# Patient Record
Sex: Female | Born: 1990 | Race: White | Hispanic: No | Smoking: Current every day smoker
Health system: Southern US, Community
[De-identification: ages and names within clinical notes are randomized; demographics above are authoritative.]

## PROBLEM LIST (undated history)

## (undated) DIAGNOSIS — F32A Depression, unspecified: Secondary | ICD-10-CM

## (undated) DIAGNOSIS — F419 Anxiety disorder, unspecified: Secondary | ICD-10-CM

## (undated) DIAGNOSIS — I639 Cerebral infarction, unspecified: Secondary | ICD-10-CM

## (undated) DIAGNOSIS — I671 Cerebral aneurysm, nonruptured: Secondary | ICD-10-CM

## (undated) DIAGNOSIS — I729 Aneurysm of unspecified site: Secondary | ICD-10-CM

## (undated) DIAGNOSIS — I67841 Reversible cerebrovascular vasoconstriction syndrome: Secondary | ICD-10-CM

## (undated) HISTORY — PX: MULTIPLE TOOTH EXTRACTIONS: SHX2053

## (undated) HISTORY — DX: Depression, unspecified: F32.A

## (undated) HISTORY — DX: Cerebral aneurysm, nonruptured: I67.1

---
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
## 2011-12-08 DIAGNOSIS — I639 Cerebral infarction, unspecified: Secondary | ICD-10-CM

## 2011-12-08 DIAGNOSIS — I67841 Reversible cerebrovascular vasoconstriction syndrome: Secondary | ICD-10-CM

## 2011-12-08 HISTORY — DX: Reversible cerebrovascular vasoconstriction syndrome: I67.841

## 2011-12-08 HISTORY — DX: Cerebral infarction, unspecified: I63.9

## 2012-03-03 DIAGNOSIS — I609 Nontraumatic subarachnoid hemorrhage, unspecified: Secondary | ICD-10-CM | POA: Insufficient documentation

## 2012-03-10 DIAGNOSIS — D649 Anemia, unspecified: Secondary | ICD-10-CM | POA: Insufficient documentation

## 2012-03-10 DIAGNOSIS — I67848 Other cerebrovascular vasospasm and vasoconstriction: Secondary | ICD-10-CM | POA: Insufficient documentation

## 2012-03-10 DIAGNOSIS — D72829 Elevated white blood cell count, unspecified: Secondary | ICD-10-CM | POA: Insufficient documentation

## 2013-03-28 DIAGNOSIS — R413 Other amnesia: Secondary | ICD-10-CM | POA: Insufficient documentation

## 2016-07-23 DIAGNOSIS — F410 Panic disorder [episodic paroxysmal anxiety] without agoraphobia: Secondary | ICD-10-CM | POA: Insufficient documentation

## 2016-07-23 DIAGNOSIS — F411 Generalized anxiety disorder: Secondary | ICD-10-CM | POA: Insufficient documentation

## 2016-11-06 ENCOUNTER — Telehealth: Payer: Self-pay

## 2016-11-06 ENCOUNTER — Ambulatory Visit: Payer: Medicaid Other | Admitting: Neurology

## 2016-11-06 NOTE — Telephone Encounter (Signed)
Appt cancelled due to patient having pneumonia.

## 2016-11-12 ENCOUNTER — Encounter: Payer: Self-pay | Admitting: Neurology

## 2016-12-03 ENCOUNTER — Telehealth: Payer: Self-pay | Admitting: *Deleted

## 2016-12-03 NOTE — Telephone Encounter (Signed)
Called patient to see if she'd move her appt tomorrow to a different time. She stated her mother had just called and rescheduled her for 12/29/16. She then stated she prefers a female dr.  Informed her this RN will route her request to new pt coordinator to call patient today at her request.

## 2016-12-04 ENCOUNTER — Ambulatory Visit: Payer: Medicaid Other | Admitting: Neurology

## 2016-12-29 ENCOUNTER — Ambulatory Visit: Payer: Medicaid Other | Admitting: Neurology

## 2017-01-04 ENCOUNTER — Ambulatory Visit: Payer: Medicaid Other | Admitting: Neurology

## 2017-01-04 ENCOUNTER — Telehealth: Payer: Self-pay | Admitting: *Deleted

## 2017-01-04 NOTE — Telephone Encounter (Signed)
no showed new pt appt 

## 2017-01-05 ENCOUNTER — Encounter: Payer: Self-pay | Admitting: Neurology

## 2017-06-22 DIAGNOSIS — G40909 Epilepsy, unspecified, not intractable, without status epilepticus: Secondary | ICD-10-CM | POA: Insufficient documentation

## 2018-01-04 ENCOUNTER — Encounter: Payer: Self-pay | Admitting: *Deleted

## 2018-01-25 ENCOUNTER — Encounter: Payer: Medicaid Other | Admitting: Obstetrics & Gynecology

## 2018-07-19 ENCOUNTER — Encounter (HOSPITAL_COMMUNITY): Payer: Self-pay

## 2018-07-19 ENCOUNTER — Emergency Department (HOSPITAL_COMMUNITY)
Admission: EM | Admit: 2018-07-19 | Discharge: 2018-07-19 | Disposition: A | Discharge status: Home / Self Care | Payer: Medicaid Other | Attending: Emergency Medicine | Admitting: Emergency Medicine

## 2018-07-19 ENCOUNTER — Other Ambulatory Visit: Payer: Self-pay

## 2018-07-19 ENCOUNTER — Emergency Department (HOSPITAL_COMMUNITY): Payer: Medicaid Other

## 2018-07-19 DIAGNOSIS — F17200 Nicotine dependence, unspecified, uncomplicated: Secondary | ICD-10-CM | POA: Insufficient documentation

## 2018-07-19 DIAGNOSIS — F410 Panic disorder [episodic paroxysmal anxiety] without agoraphobia: Secondary | ICD-10-CM | POA: Insufficient documentation

## 2018-07-19 DIAGNOSIS — R0789 Other chest pain: Secondary | ICD-10-CM | POA: Insufficient documentation

## 2018-07-19 DIAGNOSIS — F419 Anxiety disorder, unspecified: Secondary | ICD-10-CM | POA: Diagnosis not present

## 2018-07-19 HISTORY — DX: Aneurysm of unspecified site: I72.9

## 2018-07-19 HISTORY — DX: Anxiety disorder, unspecified: F41.9

## 2018-07-19 HISTORY — DX: Cerebral infarction, unspecified: I63.9

## 2018-07-19 LAB — I-STAT TROPONIN, ED: Troponin i, poc: 0 ng/mL (ref 0.00–0.08)

## 2018-07-19 LAB — CBC
HEMATOCRIT: 42.4 % (ref 36.0–46.0)
Hemoglobin: 14.8 g/dL (ref 12.0–15.0)
MCH: 30.9 pg (ref 26.0–34.0)
MCHC: 34.9 g/dL (ref 30.0–36.0)
MCV: 88.5 fL (ref 78.0–100.0)
Platelets: 315 10*3/uL (ref 150–400)
RBC: 4.79 MIL/uL (ref 3.87–5.11)
RDW: 12.6 % (ref 11.5–15.5)
WBC: 7.8 10*3/uL (ref 4.0–10.5)

## 2018-07-19 LAB — BASIC METABOLIC PANEL
Anion gap: 13 (ref 5–15)
BUN: 13 mg/dL (ref 6–20)
CO2: 23 mmol/L (ref 22–32)
Calcium: 9.8 mg/dL (ref 8.9–10.3)
Chloride: 103 mmol/L (ref 98–111)
Creatinine, Ser: 0.65 mg/dL (ref 0.44–1.00)
GFR calc Af Amer: >60 mL/min (ref >60)
GLUCOSE: 102 mg/dL — AB (ref 70–99)
POTASSIUM: 3.6 mmol/L (ref 3.5–5.1)
Sodium: 139 mmol/L (ref 135–145)

## 2018-07-19 LAB — I-STAT BETA HCG BLOOD, ED (MC, WL, AP ONLY)

## 2018-07-19 MED ORDER — DIAZEPAM 5 MG PO TABS: 5.0000 mg | ORAL_TABLET | Freq: Four times a day (QID) | ORAL | 0 refills | Status: DC | PRN

## 2018-07-19 MED ORDER — LORAZEPAM 2 MG/ML IJ SOLN
1.0000 mg | Freq: Once | INTRAMUSCULAR | Status: AC
  Administered 2018-07-19: 1 mg via INTRAVENOUS
  Filled 2018-07-19: qty 1

## 2018-07-19 MED ORDER — SODIUM CHLORIDE 0.9 % IV BOLUS
1000.0000 mL | Freq: Once | INTRAVENOUS | Status: AC
  Administered 2018-07-19: 1000 mL via INTRAVENOUS

## 2018-07-19 NOTE — ED Triage Notes (Addendum)
Pt states she feels like shes been having anxiety x 2 weeks. Pt states it feels like her heart is racing but it's not. Denies SI HI. Takes xanax, last taken yesterday around 5:30pm. Pt is requesting to be seen by a doctor because she wants to see what wrong with her, anxiety normally doesn't last this long. Reports pain "right under left boob."

## 2018-07-19 NOTE — Discharge Instructions (Signed)
Take valium as needed for anxiety.   See your doctor  Return to ER if you have worse chest pain, anxiety, shortness of breath.

## 2018-07-19 NOTE — ED Provider Notes (Signed)
[facility]-EMERGENCY DEPT Provider Note   CSN: [id number] Arrival date & time: 07/19/18  1151     History   Chief Complaint Chief Complaint  Patient presents with  . Chest Pain  . Anxiety    HPI Dawn Muscatshley Rashad is a [age] y.o. female hx of anxiety, SAH, chronic pain on Suboxone here presenting with anxiety, chest pain.  Patient states that she has been worried about her dental procedure that is scheduled today.  She is supposed to get multiple teeth extracted today.  However she was unable to sleep last several nights due to anxiety.  She states that she has been having some panic attacks and occasional palpitation for the last 2 weeks.  Has some chest pressure associated with that as well.  Patient denies any recent travel history of blood clots.  Patient took a dose of Xanax yesterday with improvement of symptoms.  She states she had been on Valium previously and that really helps with her anxiety symptoms.  History of blood clots or CAD.  The history is provided by the patient.    Past Medical History:  Diagnosis Date  . Aneurysm (HCC) 2013  . Anxiety   . Stroke [facility](HCC) 2013    There are no active problems to display for this patient.   History reviewed. No pertinent surgical history.   OB History   None      Home Medications    Prior to Admission medications   Not on File    Family History History reviewed. No pertinent family history.  Social History Social History   Tobacco Use  . Smoking status: Current Every Day Smoker  Substance Use Topics  . Alcohol use: Not Currently  . Drug use: Not Currently     Allergies   Hydrocodone-acetaminophen; Morphine; Naloxone; and Varenicline   Review of Systems Review of Systems  Cardiovascular: Positive for chest pain.  All other systems reviewed and are negative.    Physical Exam Updated Vital Signs BP 116/81   Pulse 67   Temp 98.1 F (36.7 C) (Oral)   Resp 16   Ht 5\' 8"  (1.727 m)    Wt 77.1 kg   LMP 06/17/2018   SpO2 100%   BMI 25.85 kg/m   Physical Exam  Constitutional: She is oriented to person, place, and time.  Anxious   HENT:  Head: Normocephalic.  Eyes: Pupils are equal, round, and reactive to light. EOM are normal.  Neck: Normal range of motion. Neck supple.  Cardiovascular: Normal rate, regular rhythm, intact distal pulses and normal pulses.  Pulmonary/Chest: Effort normal and breath sounds normal.  Abdominal: Soft. Bowel sounds are normal.  Musculoskeletal: Normal range of motion.       Right lower leg: Normal.       Left lower leg: Normal.  Neurological: She is alert and oriented to person, place, and time.  Skin: Skin is warm. Capillary refill takes less than 2 seconds.  Psychiatric:  Anxious, not suicidal. Normal judgment   Nursing note and vitals reviewed.    ED Treatments / Results  Labs (all labs ordered are listed, but only abnormal results are displayed) Labs Reviewed  BASIC METABOLIC PANEL - Abnormal; Notable for the following components:      Result Value   Glucose, Bld 102 (*)    All other components within normal limits  CBC  I-STAT TROPONIN, ED  I-STAT BETA HCG BLOOD, ED ([facility], [facility], [facility] ONLY)    EKG EKG Interpretation  Date/Time:  Tuesday July 19 2018 12:22:36 EDT Ventricular Rate:  85 PR Interval:    QRS Duration: 68 QT Interval:  426 QTC Calculation: 507 R Axis:   56 Text Interpretation:  Sinus rhythm Right atrial enlargement Borderline T wave abnormalities Prolonged QT interval No previous ECGs available Confirmed by Richardean CanalYao, Elijah Michaelis H [phone]([id number]) on 07/19/2018 1:41:19 PM   Radiology Dg Chest 2 View  Result Date: 07/19/2018 CLINICAL DATA:  Initial evaluation for acute chest pain, anxiety. EXAM: CHEST - 2 VIEW COMPARISON:  None. FINDINGS: Patient is slightly rotated to the right. Cardiac and mediastinal silhouettes within normal limits. Lungs normally inflated. Subtly increased asymmetric hazy opacity within the right lung  base as compared to the left felt to be related to patient rotation. No focal infiltrates. No pulmonary edema or pleural effusion. No pneumothorax. No acute osseous abnormality. IMPRESSION: No active cardiopulmonary disease. Electronically Signed   By: Rise MuBenjamin  McClintock M.D.   On: 07/19/2018 14:07    Procedures Procedures (including critical care time)  Medications Ordered in ED Medications  sodium chloride 0.9 % bolus 1,000 mL (1,000 mLs Intravenous New Bag/Given 07/19/18 1432)  LORazepam (ATIVAN) injection 1 mg (1 mg Intravenous Given 07/19/18 1433)     Initial Impression / Assessment and Plan / ED Course  I have reviewed the triage vital signs and the nursing notes.  Pertinent labs & imaging results that were available during my care of the patient were reviewed by me and considered in my medical decision making (see chart for details).     Dawn Davies is a 27 y.o. female here with anxiety. Likely panic attacks. She is concerned about her heart. She has low heart score and symptoms atypical for ACS and I doubt PE. Will get trop x 1 (symptoms for 2 weeks), CXR, labs. Will give ativan and reassess.   2:59 PM Labs unremarkable. CXR clear. Felt better after ativan. She request some valium prn for anxiety. Told her that she should take one before her dental procedure.    Final Clinical Impressions(s) / ED Diagnoses   Final diagnoses:  None    ED Discharge Orders    None       Charlynne PanderYao, Ioane Bhola Hsienta, MD 07/19/18 1501

## 2018-07-19 NOTE — ED Notes (Signed)
Pt had drawn for labs:  Gold Blue Lavender Lt green dk green x1

## 2018-07-27 ENCOUNTER — Encounter (HOSPITAL_COMMUNITY): Payer: Self-pay | Admitting: Emergency Medicine

## 2018-07-27 ENCOUNTER — Other Ambulatory Visit: Payer: Self-pay

## 2018-07-27 ENCOUNTER — Emergency Department (HOSPITAL_COMMUNITY): Payer: Medicaid Other

## 2018-07-27 ENCOUNTER — Emergency Department (HOSPITAL_COMMUNITY)
Admission: EM | Admit: 2018-07-27 | Discharge: 2018-07-27 | Disposition: A | Discharge status: Home / Self Care | Payer: Medicaid Other | Attending: Emergency Medicine | Admitting: Emergency Medicine

## 2018-07-27 DIAGNOSIS — F17200 Nicotine dependence, unspecified, uncomplicated: Secondary | ICD-10-CM | POA: Insufficient documentation

## 2018-07-27 DIAGNOSIS — G44209 Tension-type headache, unspecified, not intractable: Secondary | ICD-10-CM | POA: Insufficient documentation

## 2018-07-27 DIAGNOSIS — R51 Headache: Secondary | ICD-10-CM | POA: Diagnosis present

## 2018-07-27 LAB — CBC WITH DIFFERENTIAL/PLATELET
BASOS PCT: 0 %
Basophils Absolute: 0 10*3/uL (ref 0.0–0.1)
EOS PCT: 1 %
Eosinophils Absolute: 0 10*3/uL (ref 0.0–0.7)
HEMATOCRIT: 41.9 % (ref 36.0–46.0)
Hemoglobin: 14 g/dL (ref 12.0–15.0)
Lymphocytes Relative: 22 %
Lymphs Abs: 1.3 10*3/uL (ref 0.7–4.0)
MCH: 30.4 pg (ref 26.0–34.0)
MCHC: 33.4 g/dL (ref 30.0–36.0)
MCV: 90.9 fL (ref 78.0–100.0)
MONO ABS: 0.2 10*3/uL (ref 0.1–1.0)
Monocytes Relative: 4 %
NEUTROS ABS: 4.5 10*3/uL (ref 1.7–7.7)
Neutrophils Relative %: 73 %
PLATELETS: 263 10*3/uL (ref 150–400)
RBC: 4.61 MIL/uL (ref 3.87–5.11)
RDW: 13.6 % (ref 11.5–15.5)
WBC: 6.2 10*3/uL (ref 4.0–10.5)

## 2018-07-27 LAB — BASIC METABOLIC PANEL
Anion gap: 11 (ref 5–15)
BUN: 11 mg/dL (ref 6–20)
CALCIUM: 9.6 mg/dL (ref 8.9–10.3)
CO2: 24 mmol/L (ref 22–32)
Chloride: 108 mmol/L (ref 98–111)
Creatinine, Ser: 0.88 mg/dL (ref 0.44–1.00)
GFR calc Af Amer: >60 mL/min (ref >60)
GLUCOSE: 146 mg/dL — AB (ref 70–99)
Potassium: 3.6 mmol/L (ref 3.5–5.1)
SODIUM: 143 mmol/L (ref 135–145)

## 2018-07-27 LAB — I-STAT BETA HCG BLOOD, ED (MC, WL, AP ONLY): I-stat hCG, quantitative: <5 m[IU]/mL (ref <5)

## 2018-07-27 MED ORDER — IBUPROFEN 800 MG PO TABS: 800.0000 mg | ORAL_TABLET | Freq: Three times a day (TID) | ORAL | 0 refills | Status: DC | PRN

## 2018-07-27 MED ORDER — METOCLOPRAMIDE HCL 10 MG PO TABS: 10.0000 mg | ORAL_TABLET | Freq: Three times a day (TID) | ORAL | 0 refills | Status: DC | PRN

## 2018-07-27 MED ORDER — DIPHENHYDRAMINE HCL 50 MG/ML IJ SOLN
50.0000 mg | Freq: Once | INTRAMUSCULAR | Status: AC
  Administered 2018-07-27: 50 mg via INTRAVENOUS
  Filled 2018-07-27: qty 1

## 2018-07-27 MED ORDER — SODIUM CHLORIDE 0.9 % IV BOLUS
500.0000 mL | Freq: Once | INTRAVENOUS | Status: AC
Start: 2018-07-27 — End: 2018-07-27
  Administered 2018-07-27: 500 mL via INTRAVENOUS

## 2018-07-27 MED ORDER — KETOROLAC TROMETHAMINE 30 MG/ML IJ SOLN
30.0000 mg | Freq: Once | INTRAMUSCULAR | Status: AC
  Administered 2018-07-27: 30 mg via INTRAVENOUS
  Filled 2018-07-27: qty 1

## 2018-07-27 MED ORDER — METOCLOPRAMIDE HCL 5 MG/ML IJ SOLN
10.0000 mg | Freq: Once | INTRAMUSCULAR | Status: AC
  Administered 2018-07-27: 10 mg via INTRAVENOUS
  Filled 2018-07-27: qty 2

## 2018-07-27 MED ORDER — LORAZEPAM 2 MG/ML IJ SOLN
1.0000 mg | Freq: Once | INTRAMUSCULAR | Status: AC
  Administered 2018-07-27: 1 mg via INTRAVENOUS
  Filled 2018-07-27: qty 1

## 2018-07-27 NOTE — ED Triage Notes (Signed)
Per EMS, patient from home, c/o headache x3 days. Reports taking tylenol today with no relief. Patient also c/o anxiety, requesting xanax from EMS. Hx aneurysm.  BP 134/70

## 2018-07-27 NOTE — ED Notes (Signed)
Pt ambulated to the BR with steady gait.  Reports her headache is much better.  Family remains at bedside.

## 2018-07-27 NOTE — ED Provider Notes (Signed)
Emergency Department Provider Note   I have reviewed the triage vital signs and the nursing notes.   HISTORY  Chief Complaint Headache   HPI Dawn Davies is a 27 y.o. female with PMH of SAH and anxiety resents to the emergency department for evaluation of intermittent headache over the past 3 days with increasing anxiety symptoms.  The patient states that she has had a mild, on and off, right-sided headache over the past 3 days.  She felt like an hour before ED presentation her headache worsened.  Reports some photophobia but denies blurry or double vision.  No speech changes.  States it does not feel like her SAH type HA in the past. In 2013 she was treated for post-partum SAH. No aneurysm was found or coiled. She does have a history of migraine HA as well. Denies any weakness/numbness symptoms. No fever/chills. She went to her PCP today for medication refills for anxiety meds and is requesting something here in the ED.   Past Medical History:  Diagnosis Date  . Aneurysm (HCC) 2013  . Anxiety   . Stroke [facility](HCC) 2013    There are no active problems to display for this patient.   History reviewed. No pertinent surgical history.  Allergies Hydrocodone-acetaminophen; Morphine; Naloxone; and Varenicline  No family history on file.  Social History Social History   Tobacco Use  . Smoking status: Current Every Day Smoker  Substance Use Topics  . Alcohol use: Not Currently  . Drug use: Not Currently    Review of Systems  Constitutional: No fever/chills Eyes: No visual changes. Positive photophobia.  ENT: No sore throat. Cardiovascular: Denies chest pain. Respiratory: Denies shortness of breath. Gastrointestinal: No abdominal pain. Positive nausea, no vomiting.  No diarrhea.  No constipation. Genitourinary: Negative for dysuria. Musculoskeletal: Negative for back pain. Skin: Negative for rash. Neurological: Negative for focal weakness or numbness. Positive HA.    10-point ROS otherwise negative.  ____________________________________________   PHYSICAL EXAM:  VITAL SIGNS: ED Triage Vitals [07/27/18 1225]  Enc Vitals Group     BP 125/88     Pulse Rate (!) 125     Resp (!) 22     Temp 98.9 F (37.2 C)     Temp Source Oral     SpO2 100 %     Weight 155 lb (70.3 kg)     Height 5\' 8"  (1.727 m)   Constitutional: Alert and oriented. Well appearing and in no acute distress. Eyes: Conjunctivae are normal. PERRL. EOMI. Head: Atraumatic. Nose: No congestion/rhinnorhea. Mouth/Throat: Mucous membranes are moist.  Oropharynx non-erythematous. Neck: No stridor.  No meningeal signs.  Cardiovascular: Tachycardia. Good peripheral circulation. Grossly normal heart sounds.   Respiratory: Normal respiratory effort.  No retractions. Lungs CTAB. Gastrointestinal: Soft and nontender. No distention.  Musculoskeletal: No lower extremity tenderness nor edema. No gross deformities of extremities. Neurologic:  Normal speech and language. No gross focal neurologic deficits are appreciated. Normal CN exam 2-12. No pronator drift.  Skin:  Skin is warm, dry and intact. No rash noted.   ____________________________________________   LABS (all labs ordered are listed, but only abnormal results are displayed)  Labs Reviewed  BASIC METABOLIC PANEL - Abnormal; Notable for the following components:      Result Value   Glucose, Bld 146 (*)    All other components within normal limits  CBC WITH DIFFERENTIAL/PLATELET  I-STAT BETA HCG BLOOD, ED ([facility], [facility], [facility] ONLY)   ____________________________________________  RADIOLOGY  Ct Head Wo Contrast  Result Date: 07/27/2018 CLINICAL DATA:  Headache for 3 days. EXAM: CT HEAD WITHOUT CONTRAST TECHNIQUE: Contiguous axial images were obtained from the base of the skull through the vertex without intravenous contrast. COMPARISON:  Report of prior study January 12, 2016 available; images from that study cannot be retrieved.  FINDINGS: Brain: The ventricles are normal in size and configuration. There is no intracranial mass, hemorrhage, extra-axial fluid collection, or midline shift. Gray-white compartments are normal. No evident acute infarct. Vascular: No hyperdense vessels.  No abnormal calcification noted. Skull: Bony calvarium appears intact. Sinuses/Orbits: There is mucosal thickening in several ethmoid air cells. Visualized paranasal sinuses elsewhere clear. Orbits appear symmetric bilaterally. Other: Mastoid air cells are clear. IMPRESSION: Slight mucosal thickening in several ethmoid air cells. Study otherwise unremarkable. Electronically Signed   By: Bretta BangWilliam  Woodruff III M.D.   On: 07/27/2018 14:50    ____________________________________________   PROCEDURES  Procedure(s) performed:   Procedures  None ____________________________________________   INITIAL IMPRESSION / ASSESSMENT AND PLAN / ED COURSE  Pertinent labs & imaging results that were available during my care of the patient were reviewed by me and considered in my medical decision making (see chart for details).  Patient presents to the emergency department for evaluation of intermittent headache over the past 3 days which is worsened significantly in the last hour.  No neurological deficits.  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  Patient is well-appearing.  She does report some anxiety symptoms.  Headache does not seem consistent with a sentinel bleed from an aneurysm.  Plan for headache treatment, baseline labs, and CT imaging of the head.  We are within 1 hour of headache suddenly worsening.   02:55 PM CT head negative. Labs unremarkable. Patient is feeling complete relief of HA symptoms. [facility]. Discussed remaining slight risk of SAH and LP would be the next test if clinical concern was high. Will defer LP at this time after shared decision  making discussion with patient. Will provide NSG follow up information as the patient states that she is supposed to have regular Neurosurgery follow up since 2013.   At this time, I do not feel there is any life-threatening condition present. I have reviewed and discussed all results (EKG, imaging, lab, urine as appropriate), exam findings with patient. I have reviewed nursing notes and appropriate previous records.  I feel the patient is safe to be discharged home without further emergent workup. Discussed usual and customary return precautions. Patient and family (if present) verbalize understanding and are comfortable with this plan.  Patient will follow-up with their primary care provider. If they do not have a primary care provider, information for follow-up has been provided to them. All questions have been answered.   ____________________________________________  FINAL CLINICAL IMPRESSION(S) / ED DIAGNOSES  Final diagnoses:  Acute non intractable tension-type headache     MEDICATIONS GIVEN DURING THIS VISIT:  Medications  ketorolac (TORADOL) 30 MG/ML injection 30 mg (has no administration in time range)  sodium chloride 0.9 % bolus 500 mL (0 mLs Intravenous Stopped 07/27/18 1429)  metoCLOPramide (REGLAN) injection 10 mg (10 mg Intravenous Given 07/27/18 1323)  diphenhydrAMINE (BENADRYL) injection 50 mg (50 mg Intravenous Given 07/27/18 1321)  LORazepam (ATIVAN) injection 1 mg (1 mg Intravenous Given 07/27/18 1321)     NEW OUTPATIENT MEDICATIONS STARTED DURING THIS VISIT:  New Prescriptions   IBUPROFEN (ADVIL,MOTRIN) 800 MG TABLET    Take 1  tablet (800 mg total) by mouth every 8 (eight) hours as needed.   METOCLOPRAMIDE (REGLAN) 10 MG TABLET    Take 1 tablet (10 mg total) by mouth every 8 (eight) hours as needed for nausea or vomiting (or headache symptoms).    Note:  This document was prepared using Dragon voice recognition software and may include unintentional dictation  errors.  Alona Bene, MD Emergency Medicine    Long, Arlyss Repress, MD 07/27/18 [phone]

## 2018-07-27 NOTE — ED Notes (Signed)
Family at bedside. 

## 2018-07-27 NOTE — Discharge Instructions (Signed)

## 2018-09-21 DIAGNOSIS — F1320 Sedative, hypnotic or anxiolytic dependence, uncomplicated: Secondary | ICD-10-CM | POA: Insufficient documentation

## 2018-10-18 ENCOUNTER — Encounter: Payer: Self-pay | Admitting: Neurology

## 2019-01-05 ENCOUNTER — Other Ambulatory Visit: Payer: Self-pay

## 2019-01-05 ENCOUNTER — Ambulatory Visit: Payer: Medicaid Other | Admitting: Neurology

## 2019-01-05 ENCOUNTER — Encounter: Payer: Self-pay | Admitting: Neurology

## 2019-01-05 VITALS — BP 108/70 | HR 100 | Ht 68.0 in | Wt 172.0 lb

## 2019-01-05 DIAGNOSIS — R251 Tremor, unspecified: Secondary | ICD-10-CM | POA: Diagnosis not present

## 2019-01-05 NOTE — Patient Instructions (Addendum)
1. Schedule MRI brain with and without contrast  We have sent a referral to [facility] for your MRI and they will call you directly to schedule your appt. They are located at [address] [facility] If you need to contact them directly please call [phone].  2. Schedule 1-hour sleep-deprived EEG 3. Our office will call you with results, follow-up in 6 months, call for any changes

## 2019-01-05 NOTE — Progress Notes (Signed)
NEUROLOGY CONSULTATION NOTE  Dawn Davies MRN: [id number] DOB: [birth year]  Referring provider: Dr. Delano Metz Primary care provider: Dr. Delano Metz  Reason for consult:  seizures  Dear Dr Salvadore Farber:  Thank you for your kind referral of Dawn Davies for consultation of the above symptoms. Although her history is well known to you, please allow me to reiterate it for the purpose of our medical record. The patient was accompanied to the clinic by her significant other Dawn Davies who also provides collateral information. Records and images were personally reviewed where available.  HISTORY OF PRESENT ILLNESS: This is a 28 year old right-handed woman with a history of subarachnoid hemorrhage in 2013 presenting for evaluation of seizures. Records from [place] were reviewed. In March 2013, she was 3 weeks post-partum when she developed severe headaches. She went to [facility] where CT head showed [facility] and she was transferred to [facility]. 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 She was initially started on Verapamil then Nimodipine and had TCDs which showed increased velocities in the bilateral cerebral vasculature. She needed narcotics for headache pain control. On her follow-up visits she reported continued headaches and was started on Gabapentin and memory loss. Her last visit at their Neurology clinic was in 2014. There was a phone call to their office in April 2016 where she reported 4 seizures over the past year where she thinks she has anxiety attacks because she is aware of the events, recalling eye rolling, heavy breathing, both hands becoming numb, and chest tightness. She was told by one of her doctors it looked like a seizure, she was asking for Valium.   She reports today that the seizures started after  her SAH in 2013 and that she was having them initially every 3-4 weeks but quieted down, with last episode occurring over 6 months ago. She recalls her hands would get numb then she would lose consciousness/awareness. No tongue bite or incontinence. Dawn Davies reports she would alert him that it's coming on then blacks out with shaking lasting 20-30 seconds. Her eyes would be open and she would not respond. Sometimes she is confused after, but a lot of times she is not. She has fallen to the ground with them, no nocturnal seizures. She feels drained after with no focal weakness. She has anxiety attacks where she feels like she can't breath with tingling in both hands but would not pass out from them. She has anxiety when she gets a headaches or "scares myself into it" due to her prior history of SAH. These have calmed down with Xanax. Headaches occur mostly in the winter, she denies any headaches during summer time. She has some dizziness when standing up quickly. No diplopia. She was previously told the right side of her body is weaker when she was examined for her disability application. No falls. Dawn Davies denies any staring/unresponsive episodes separate from the shaking episodes, she denies any olfactory/gustatory hallucinations, deja vu, rising epigastric sensation, focal numbness/tingling/weakness. She has hypnic jerks.   Epilepsy Risk Factors:  History of prior SAH. Her sister in her teenage years would blackout with shaking. Otherwise she had a normal birth and early development.  There is no history of febrile convulsions, CNS infections such as meningitis/encephalitis, significant traumatic brain injury, neurosurgical procedures.  Prior AEDs: gabapentin (drunk), Topamax   PAST MEDICAL HISTORY: Past Medical History:  Diagnosis Date  .  Aneurysm (HCC) 2013  . Anxiety   . Stroke [facility](HCC) 2013    PAST SURGICAL HISTORY: No past surgical history on file.  MEDICATIONS: Current Outpatient Medications on  File Prior to Visit  Medication Sig Dispense Refill  . gabapentin (NEURONTIN) 600 MG tablet Take by mouth.    Marland Kitchen. omeprazole (PRILOSEC) 40 MG capsule Take by mouth.    . QUEtiapine (SEROQUEL) 100 MG tablet Take by mouth.    . ALPRAZolam (XANAX) 1 MG tablet Take 1 mg by mouth 3 (three) times daily.    . buprenorphine (SUBUTEX) 2 MG SUBL SL tablet Place 2 mg under the tongue daily.    . chlorhexidine (PERIDEX) 0.12 % solution     . citalopram (CELEXA) 20 MG tablet TK 1 T PO QD    . diazepam (VALIUM) 5 MG tablet Take 1 tablet (5 mg total) by mouth every 6 (six) hours as needed for anxiety (spasms). 10 tablet 0  . ibuprofen (ADVIL,MOTRIN) 800 MG tablet Take 1 tablet (800 mg total) by mouth every 8 (eight) hours as needed. 21 tablet 0  . metoCLOPramide (REGLAN) 10 MG tablet Take 1 tablet (10 mg total) by mouth every 8 (eight) hours as needed for nausea or vomiting (or headache symptoms). 30 tablet 0   No current facility-administered medications on file prior to visit.     ALLERGIES: Allergies  Allergen Reactions  . Hydrocodone-Acetaminophen Other (See Comments)    Reaction not specified by patient Reaction not specified by pat   . Morphine Rash  . Naloxone Nausea And Vomiting  . Varenicline Other (See Comments)    Headaches    FAMILY HISTORY: No family history on file.  SOCIAL HISTORY: Social History   Socioeconomic History  . Marital status: Legally Separated    Spouse name: Not on file  . Number of children: Not on file  . Years of education: Not on file  . Highest education level: Not on file  Occupational History  . Not on file  Social Needs  . Financial resource strain: Not on file  . Food insecurity:    Worry: Not on file    Inability: Not on file  . Transportation needs:    Medical: Not on file    Non-medical: Not on file  Tobacco Use  . Smoking status: Current Every Day Smoker  Substance and Sexual Activity  . Alcohol use: Not Currently  . Drug use: Not  Currently  . Sexual activity: Yes  Lifestyle  . Physical activity:    Days per week: Not on file    Minutes per session: Not on file  . Stress: Not on file  Relationships  . Social connections:    Talks on phone: Not on file    Gets together: Not on file    Attends religious service: Not on file    Active member of club or organization: Not on file    Attends meetings of clubs or organizations: Not on file    Relationship status: Not on file  . Intimate partner violence:    Fear of current or ex partner: Not on file    Emotionally abused: Not on file    Physically abused: Not on file    Forced sexual activity: Not on file  Other Topics Concern  . Not on file  Social History Narrative  . Not on file    REVIEW OF SYSTEMS: Constitutional: No fevers, chills, or sweats, no generalized fatigue, change in appetite Eyes: No visual changes,  double vision, eye pain Ear, nose and throat: No hearing loss, ear pain, nasal congestion, sore throat Cardiovascular: No chest pain, palpitations Respiratory:  No shortness of breath at rest or with exertion, wheezes GastrointestinaI: No nausea, vomiting, diarrhea, abdominal pain, fecal incontinence Genitourinary:  No dysuria, urinary retention or frequency Musculoskeletal:  No neck pain, back pain Integumentary: No rash, pruritus, skin lesions Neurological: as above Psychiatric: No depression, insomnia, anxiety Endocrine: No palpitations, fatigue, diaphoresis, mood swings, change in appetite, change in weight, increased thirst Hematologic/Lymphatic:  No anemia, purpura, petechiae. Allergic/Immunologic: no itchy/runny eyes, nasal congestion, recent allergic reactions, rashes  PHYSICAL EXAM: Vitals:   01/05/19 1036  BP: 108/70  Pulse: 100  SpO2: 98%   General: No acute distress Head:  Normocephalic/atraumatic Eyes: Fundoscopic exam shows bilateral sharp discs, no vessel changes, exudates, or hemorrhages Neck: supple, no paraspinal  tenderness, full range of motion Back: No paraspinal tenderness Heart: regular rate and rhythm Lungs: Clear to auscultation bilaterally. Vascular: No carotid bruits. Skin/Extremities: No rash, no edema Neurological Exam: Mental status: alert and oriented to person, place, and time, no dysarthria or aphasia, Fund of knowledge is appropriate.  Recent and remote memory are intact. 3/3 delayed recall. Attention and concentration are normal.    Able to name objects and repeat phrases. Cranial nerves: CN I: not tested CN II: pupils equal, round and reactive to light, visual fields intact, fundi unremarkable. CN III, IV, VI:  full range of motion, no nystagmus, no ptosis CN V: facial sensation intact CN VII: upper and lower face symmetric CN VIII: hearing intact to finger rub CN IX, X: gag intact, uvula midline CN XI: sternocleidomastoid and trapezius muscles intact CN XII: tongue midline Bulk & Tone: normal, no fasciculations. Motor: 5/5 throughout with no pronator drift. Sensation: intact to light touch, cold, pin, vibration and joint position sense.  No extinction to double simultaneous stimulation.  Romberg test negative Deep Tendon Reflexes: +2 throughout, no ankle clonus Plantar responses: downgoing bilaterally Cerebellar: no incoordination on finger to nose, heel to shin. No dysdiadochokinesia Gait: narrow-based and steady, able to tandem walk adequately. Tremor: none  IMPRESSION: This is a pleasant 28 year old right-handed woman with a history of  subarachnoid hemorrhage in 2013 felt secondary to RCVS from postpartum angiopathy and marijuana use (cerebral angiogram did not show any evidence of aneurysm), presenting for evaluation of seizures. She states seizures started after her bleed in 2013, there is no record of this except for a phone call in 2016 where she reports seizures within the past year. Her significant other describes episodes of shaking with unresponsiveness. She does have  a risk factor for seizures with prior history of SAH, however non-epileptic events are also a consideration, she had previously reported that Valium helped. She has not been prescribed medication for seizures in the past. She was on gabapentin and topiramate in 2013 for headaches. She is currently not taking any AEDs and denies any events in the past 6 months. She is interested in returning to driving. MRI brain with and without contrast and a 1-hour EEG will be ordered to assess for focal abnormalities that increase risk for recurrent seizures. We discussed [place] driving laws to stop driving after a seizure until 6 months seizure-free. Follow-up in 6 months, then know to call for any changes.   Thank you for allowing me to participate in the care of this patient. Please do not hesitate to call for any questions or concerns.   Patrcia DollyKaren , M.D.  CC:  Dr. Salvadore Farber

## 2019-01-11 ENCOUNTER — Other Ambulatory Visit: Payer: Medicaid Other

## 2019-01-18 ENCOUNTER — Ambulatory Visit (INDEPENDENT_AMBULATORY_CARE_PROVIDER_SITE_OTHER): Payer: Medicaid Other | Admitting: Neurology

## 2019-01-18 DIAGNOSIS — R251 Tremor, unspecified: Secondary | ICD-10-CM | POA: Diagnosis not present

## 2019-01-18 NOTE — Procedures (Signed)
ELECTROENCEPHALOGRAM REPORT  Date of Study: 01/18/2019  Patient's Name: Dawn Davies MRN: [id number] Date of Birth: [birth year]  Referring Provider: Dr. Patrcia Dolly  Clinical History: This is a 28 year old woman with a history of SAH with recurrent episodes of shaking with unresponsiveness.  Medications: XANAX 1 MG tablet   NEURONTIN 600 MG tablet  PRILOSEC 40 MG capsule  SEROQUEL 100 MG tablet  SUBUTEX 2 MG SUBL SL tablet PERIDEX 0.12 % solution    CELEXA 20 MG tablet  VALIUM 5 MG tablet    ADVIL,MOTRIN 800 MG tablet  REGLAN 10 MG tablet  Technical Summary: A multichannel 1-hour sleep-deprived digital EEG recording measured by the international 10-20 system with electrodes applied with paste and impedances below 5000 ohms performed in our laboratory with EKG monitoring in an awake and asleep patient.  Hyperventilation and photic stimulation were performed.  The digital EEG was referentially recorded, reformatted, and digitally filtered in a variety of bipolar and referential montages for optimal display.    Description: The patient is awake and asleep during the recording.  During maximal wakefulness, there is a poorly sustained 9 Hz posterior dominant rhythm that attenuates with eye opening.  The record is symmetric.  There is an excess amount of diffuse low voltage beta activity seen throughout the recording. During drowsiness and sleep, there is an increase in theta slowing of the background. Vertex waves and symmetric sleep spindles were seen.  Hyperventilation and photic stimulation did not elicit any abnormalities.  There were no epileptiform discharges or electrographic seizures seen.    EKG lead was unremarkable.  Impression: This 1-hour awake and asleep EEG is normal except for excess amount of diffuse low voltage beta activity.  Clinical Correlation: Diffuse low voltage beta activity is commonly seen with sedating medications such as benzodiazepines.  In the absence  of sedating medications, anxiety and hyperthyroidism may produce generalized beta activity.  The absence of epileptiform discharges does not exclude a clinical diagnosis of epilepsy.  If further clinical questions remain, prolonged EEG may be helpful.  Clinical correlation is advised.   Patrcia Dolly, M.D.

## 2019-01-21 ENCOUNTER — Other Ambulatory Visit: Payer: Medicaid Other

## 2019-01-22 ENCOUNTER — Ambulatory Visit
Admission: RE | Admit: 2019-01-22 | Discharge: 2019-01-22 | Disposition: A | Discharge status: Home / Self Care | Payer: Medicaid Other | Source: Ambulatory Visit | Attending: Neurology | Admitting: Neurology

## 2019-01-22 DIAGNOSIS — R251 Tremor, unspecified: Secondary | ICD-10-CM

## 2019-02-12 ENCOUNTER — Ambulatory Visit
Admission: RE | Admit: 2019-02-12 | Discharge: 2019-02-12 | Disposition: A | Discharge status: Home / Self Care | Payer: Medicaid Other | Source: Ambulatory Visit | Attending: Neurology | Admitting: Neurology

## 2019-02-12 MED ORDER — GADOBENATE DIMEGLUMINE 529 MG/ML IV SOLN
15.0000 mL | Freq: Once | INTRAVENOUS | Status: AC | PRN
Start: 2019-02-12 — End: 2019-02-12
  Administered 2019-02-12: 15 mL via INTRAVENOUS

## 2019-03-08 DIAGNOSIS — F331 Major depressive disorder, recurrent, moderate: Secondary | ICD-10-CM | POA: Insufficient documentation

## 2019-07-12 ENCOUNTER — Telehealth (INDEPENDENT_AMBULATORY_CARE_PROVIDER_SITE_OTHER): Payer: Medicaid Other | Admitting: Adult Health

## 2019-07-12 ENCOUNTER — Encounter: Payer: Self-pay | Admitting: Adult Health

## 2019-07-12 ENCOUNTER — Other Ambulatory Visit: Payer: Self-pay

## 2019-07-12 DIAGNOSIS — Z8673 Personal history of transient ischemic attack (TIA), and cerebral infarction without residual deficits: Secondary | ICD-10-CM | POA: Insufficient documentation

## 2019-07-12 DIAGNOSIS — Z3A01 Less than 8 weeks gestation of pregnancy: Secondary | ICD-10-CM | POA: Insufficient documentation

## 2019-07-12 DIAGNOSIS — F17200 Nicotine dependence, unspecified, uncomplicated: Secondary | ICD-10-CM | POA: Diagnosis not present

## 2019-07-12 DIAGNOSIS — O3680X0 Pregnancy with inconclusive fetal viability, not applicable or unspecified: Secondary | ICD-10-CM

## 2019-07-12 NOTE — Progress Notes (Signed)
Patient ID: Dawn Davies, female   DOB: 12/10/1990, 28 y.o.   MRN: [id number]   TELEHEALTH VIRTUAL GYNECOLOGY VISIT ENCOUNTER NOTE  I connected with Dawn Davies on 07/12/19 at  4:00 PM EDT by telephone at home and verified that I am speaking with the correct person using two identifiers.   I discussed the limitations, risks, security and privacy concerns of performing an evaluation and management service by telephone and the availability of in person appointments. I also discussed with the patient that there may be a patient responsible charge related to this service. The patient expressed understanding and agreed to proceed.   History:  Dawn Davies is a 28 y.o. G21P1001 female being evaluated today for having missed a period and had 5+HPT, LMP 05/19/19, a so about 7+5 weeks with EDD 02/23/20.She had stroke after birth of daughter 29 years ago and ?aneursym. She  She denies any abnormal vaginal discharge, bleeding, pelvic pain or other concerns.       Past Medical History:  Diagnosis Date  . Aneurysm ([place]) 2013  . Anxiety   . Stroke [facility]) 2013   History reviewed. No pertinent surgical history. The following portions of the patient's history were reviewed and updated as appropriate: allergies, current medications, past family history, past medical history, past social history, past surgical history and problem list.   Health Maintenance:  Pap at new OB  Review of Systems:  Pertinent items noted in HPI and remainder of comprehensive ROS otherwise negative.  Physical Exam:   General:  Alert, oriented and cooperative.   Mental Status: Normal mood and affect perceived. Normal judgment and thought content.  Physical exam deferred due to nature of the encounter LMP 05/19/2019 (Exact Date)   Fall risk is low PHQ 2 score is 0.   Labs and Imaging No results found for this or any previous visit (from the past 336 hour(s)). No results found.    Assessment and Plan:     1. Less than [redacted]  weeks gestation of pregnancy Continue PNV  2. Pregnancy with inconclusive fetal viability, single or unspecified fetus Will dating [name] 8/12 at 1:30 pm at [facility]   3. History of completed stroke   4. Smoker Try to quit       I discussed the assessment and treatment plan with the patient. The patient was provided an opportunity to ask questions and all were answered. The patient agreed with the plan and demonstrated an understanding of the instructions.   The patient was advised to call back or seek an in-person evaluation/go to the ED if the symptoms worsen or if the condition fails to improve as anticipated.  I provided 10 minutes of non-face-to-face time during this encounter.   Derrek Monaco, NP [facility], [place]

## 2019-07-13 ENCOUNTER — Telehealth: Payer: Self-pay | Admitting: Adult Health

## 2019-07-13 NOTE — Telephone Encounter (Signed)
Pt says she did not have pre eclampsia or elevated BP when pregnant, and her stroke was 2 weeks after deliver, get copies of records, and come in to see Dr Elonda Husky on Monday at 3 pm for BP check and record review.her dating [name] is 8/12 at 1:30 pm at [facility]. Had dicussed with Dr Rip Harbour.

## 2019-07-14 ENCOUNTER — Encounter: Payer: Medicaid Other | Admitting: Adult Health

## 2019-07-17 ENCOUNTER — Ambulatory Visit (INDEPENDENT_AMBULATORY_CARE_PROVIDER_SITE_OTHER): Payer: Medicaid Other | Admitting: Obstetrics & Gynecology

## 2019-07-17 ENCOUNTER — Encounter: Payer: Self-pay | Admitting: Obstetrics & Gynecology

## 2019-07-17 ENCOUNTER — Other Ambulatory Visit: Payer: Self-pay

## 2019-07-17 VITALS — BP 103/70 | HR 82 | Ht 68.0 in | Wt 167.0 lb

## 2019-07-17 DIAGNOSIS — I67841 Reversible cerebrovascular vasoconstriction syndrome: Secondary | ICD-10-CM

## 2019-07-17 DIAGNOSIS — Z331 Pregnant state, incidental: Secondary | ICD-10-CM

## 2019-07-17 DIAGNOSIS — O26891 Other specified pregnancy related conditions, first trimester: Secondary | ICD-10-CM

## 2019-07-17 DIAGNOSIS — Z3A08 8 weeks gestation of pregnancy: Secondary | ICD-10-CM

## 2019-07-17 DIAGNOSIS — Z1389 Encounter for screening for other disorder: Secondary | ICD-10-CM

## 2019-07-17 LAB — POCT URINALYSIS DIPSTICK OB
Blood, UA: NEGATIVE
Glucose, UA: NEGATIVE
Ketones, UA: NEGATIVE

## 2019-07-17 MED ORDER — CEPHALEXIN 500 MG PO CAPS: 500.0000 mg | ORAL_CAPSULE | Freq: Three times a day (TID) | ORAL | 0 refills | Status: DC

## 2019-07-17 NOTE — Addendum Note (Signed)
Addended by: Florian Buff on: 07/17/2019 04:12 PM   Modules accepted: Orders

## 2019-07-17 NOTE — Progress Notes (Signed)
Chief Complaint  Patient presents with  . review record    check b/p      28 y.o. G2P1001 Patient's last menstrual period was 05/19/2019 (exact date). The current method of family planning is pt is pregnant.  Outpatient Encounter Medications as of 07/17/2019  Medication Sig  . ALPRAZolam (XANAX) 1 MG tablet Take 1 mg by mouth 3 (three) times daily.  . buprenorphine (SUBUTEX) 2 MG SUBL SL tablet Place 2 mg under the tongue daily.  Marland Kitchen gabapentin (NEURONTIN) 600 MG tablet Take by mouth.  . chlorhexidine (PERIDEX) 0.12 % solution   . citalopram (CELEXA) 20 MG tablet TK 1 T PO QD  . diazepam (VALIUM) 5 MG tablet Take 1 tablet (5 mg total) by mouth every 6 (six) hours as needed for anxiety (spasms). (Patient not taking: Reported on 07/12/2019)  . ibuprofen (ADVIL,MOTRIN) 800 MG tablet Take 1 tablet (800 mg total) by mouth every 8 (eight) hours as needed. (Patient not taking: Reported on 07/12/2019)  . omeprazole (PRILOSEC) 40 MG capsule Take by mouth.  . QUEtiapine (SEROQUEL) 100 MG tablet Take by mouth.   No facility-administered encounter medications on file as of 07/17/2019.     Subjective Pt is here for early eval regarding a postpartum SAH due to reversible vasoconstrictive syndrome(no aneurysm or TE event noted) Sees Dr Delice Lesch of [facility] neurology and has appt 08/06/2019 I reviewed all of her records and notes regarding the event in 2013 She currently is on gabapenin, xanax, subutex 8 mg 1 tab SL at awaking,  1 tab at lunchtime,  1/2 at bedtime Past Medical History:  Diagnosis Date  . Anxiety   . Reversible cerebrovascular vasoconstriction syndrome 2013   2 weeks postpartum    History reviewed. No pertinent surgical history.  OB History    Gravida  2   Para  1   Term  1   Preterm      AB      Living  1     SAB      TAB      Ectopic      Multiple      Live Births              Allergies  Allergen Reactions  . Hydrocodone-Acetaminophen Other  (See Comments)    Reaction not specified by patient Reaction not specified by pat   . Morphine Rash  . Naloxone Nausea And Vomiting  . Varenicline Other (See Comments)    Headaches    Social History   Socioeconomic History  . Marital status: Legally Separated    Spouse name: Not on file  . Number of children: 1  . Years of education: Not on file  . Highest education level: Not on file  Occupational History  . Not on file  Social Needs  . Financial resource strain: Not on file  . Food insecurity    Worry: Not on file    Inability: Not on file  . Transportation needs    Medical: Not on file    Non-medical: Not on file  Tobacco Use  . Smoking status: Current Every Day Smoker    Packs/day: 0.25    Types: Cigarettes  . Smokeless tobacco: Never Used  Substance and Sexual Activity  . Alcohol use: Not Currently  . Drug use: Not Currently  . Sexual activity: Yes    Birth control/protection: None  Lifestyle  . Physical activity    Days per week: Not on  file    Minutes per session: Not on file  . Stress: Not on file  Relationships  . Social Musicianconnections    Talks on phone: Not on file    Gets together: Not on file    Attends religious service: Not on file    Active member of club or organization: Not on file    Attends meetings of clubs or organizations: Not on file    Relationship status: Not on file  Other Topics Concern  . Not on file  Social History Narrative   Pt is left handed   Lives in 2 story home with her daughter, brother and both parents   10th grade education   Has 1 child   On disability    Family History  Problem Relation Age of Onset  . Diabetes Paternal Grandmother   . Hypertension Mother   . Juvenile idiopathic arthritis Daughter     Medications:       Current Outpatient Medications:  .  ALPRAZolam (XANAX) 1 MG tablet, Take 1 mg by mouth 3 (three) times daily., Disp: , Rfl:  .  buprenorphine (SUBUTEX) 2 MG SUBL SL tablet, Place 2 mg under  the tongue daily., Disp: , Rfl:  .  gabapentin (NEURONTIN) 600 MG tablet, Take by mouth., Disp: , Rfl:  .  chlorhexidine (PERIDEX) 0.12 % solution, , Disp: , Rfl:  .  citalopram (CELEXA) 20 MG tablet, TK 1 T PO QD, Disp: , Rfl:  .  diazepam (VALIUM) 5 MG tablet, Take 1 tablet (5 mg total) by mouth every 6 (six) hours as needed for anxiety (spasms). (Patient not taking: Reported on 07/12/2019), Disp: 10 tablet, Rfl: 0 .  ibuprofen (ADVIL,MOTRIN) 800 MG tablet, Take 1 tablet (800 mg total) by mouth every 8 (eight) hours as needed. (Patient not taking: Reported on 07/12/2019), Disp: 21 tablet, Rfl: 0 .  omeprazole (PRILOSEC) 40 MG capsule, Take by mouth., Disp: , Rfl:  .  QUEtiapine (SEROQUEL) 100 MG tablet, Take by mouth., Disp: , Rfl:   Objective Blood pressure 103/70, pulse 82, height 5\' 8"  (1.727 m), weight 167 lb (75.8 kg), last menstrual period 05/19/2019.  Gen WDWN NAD no neurological deficits noted  Pertinent ROS No burning with urination, frequency or urgency No nausea, vomiting or diarrhea Nor fever chills or other constitutional symptoms   Labs or studies Reviewed past results    Impression Diagnoses this Encounter::   ICD-10-CM   1. Reversible cerebrovascular vasoconstriction syndrome  I67.841   2. Pregnant state, incidental  Z33.1 POC Urinalysis Dipstick OB  3. Screening for genitourinary condition  Z13.89 POC Urinalysis Dipstick OB    Urine Culture    Established relevant diagnosis(es):   Plan/Recommendations: No orders of the defined types were placed in this encounter.   Labs or Scans Ordered: Orders Placed This Encounter  Procedures  . Urine Culture  . POC Urinalysis Dipstick OB    Management:: >FU with Dr Karel JarvisAquino, neurology, but do not believe any change in management is needed, no indication for anti coagulation since not thromboembolic event >sonogram 2 days  Follow up Return in about 4 weeks (around 08/14/2019) for HROB, with Dr Despina HiddenEure.         Face to face time:  15 minutes  Greater than 50% of the visit time was spent in counseling and coordination of care with the patient.  The summary and outline of the counseling and care coordination is summarized in the note above.   All questions were answered.

## 2019-07-19 ENCOUNTER — Ambulatory Visit (HOSPITAL_COMMUNITY)
Admission: RE | Admit: 2019-07-19 | Discharge: 2019-07-19 | Disposition: A | Discharge status: Home / Self Care | Payer: Medicaid Other | Source: Ambulatory Visit | Attending: Adult Health | Admitting: Adult Health

## 2019-07-19 ENCOUNTER — Other Ambulatory Visit: Payer: Self-pay | Admitting: Adult Health

## 2019-07-19 ENCOUNTER — Other Ambulatory Visit: Payer: Self-pay

## 2019-07-19 DIAGNOSIS — O3680X0 Pregnancy with inconclusive fetal viability, not applicable or unspecified: Secondary | ICD-10-CM

## 2019-07-20 LAB — URINE CULTURE

## 2019-07-21 ENCOUNTER — Other Ambulatory Visit: Payer: Self-pay | Admitting: Obstetrics & Gynecology

## 2019-07-24 ENCOUNTER — Encounter: Payer: Self-pay | Admitting: Women's Health

## 2019-07-24 DIAGNOSIS — N83209 Unspecified ovarian cyst, unspecified side: Secondary | ICD-10-CM | POA: Insufficient documentation

## 2019-08-07 ENCOUNTER — Ambulatory Visit: Payer: Medicaid Other | Admitting: Neurology

## 2019-08-15 ENCOUNTER — Ambulatory Visit: Payer: Medicaid Other | Admitting: Obstetrics & Gynecology

## 2019-08-17 ENCOUNTER — Other Ambulatory Visit: Payer: Self-pay | Admitting: Obstetrics and Gynecology

## 2019-08-17 DIAGNOSIS — Z3682 Encounter for antenatal screening for nuchal translucency: Secondary | ICD-10-CM

## 2019-08-18 ENCOUNTER — Other Ambulatory Visit: Payer: Self-pay

## 2019-08-18 ENCOUNTER — Other Ambulatory Visit: Payer: Medicaid Other

## 2019-08-18 ENCOUNTER — Ambulatory Visit (INDEPENDENT_AMBULATORY_CARE_PROVIDER_SITE_OTHER): Payer: Medicaid Other

## 2019-08-18 ENCOUNTER — Ambulatory Visit: Payer: Medicaid Other | Admitting: *Deleted

## 2019-08-18 DIAGNOSIS — Z8759 Personal history of other complications of pregnancy, childbirth and the puerperium: Secondary | ICD-10-CM | POA: Insufficient documentation

## 2019-08-18 DIAGNOSIS — Z3682 Encounter for antenatal screening for nuchal translucency: Secondary | ICD-10-CM

## 2019-08-18 DIAGNOSIS — Z3A11 11 weeks gestation of pregnancy: Secondary | ICD-10-CM

## 2019-08-18 DIAGNOSIS — B1920 Unspecified viral hepatitis C without hepatic coma: Secondary | ICD-10-CM | POA: Insufficient documentation

## 2019-08-18 DIAGNOSIS — Z1371 Encounter for nonprocreative screening for genetic disease carrier status: Secondary | ICD-10-CM

## 2019-08-18 DIAGNOSIS — Z1379 Encounter for other screening for genetic and chromosomal anomalies: Secondary | ICD-10-CM

## 2019-08-18 DIAGNOSIS — O0990 Supervision of high risk pregnancy, unspecified, unspecified trimester: Secondary | ICD-10-CM | POA: Insufficient documentation

## 2019-08-18 DIAGNOSIS — Z3481 Encounter for supervision of other normal pregnancy, first trimester: Secondary | ICD-10-CM

## 2019-08-18 NOTE — Progress Notes (Signed)
[name] 11+4 wks,measurement c/w dates,crl 58.13 mm,fhr 162 bpm,posterior placenta gr 0,simple right ovarian cyst with arterial and venous flow 5.3 x 4.8 x 3.6 cm,NB present,NT 1.8 mm

## 2019-08-20 ENCOUNTER — Encounter: Payer: Self-pay | Admitting: Advanced Practice Midwife

## 2019-08-20 DIAGNOSIS — Z6791 Unspecified blood type, Rh negative: Secondary | ICD-10-CM | POA: Insufficient documentation

## 2019-08-21 ENCOUNTER — Telehealth: Payer: Self-pay | Admitting: *Deleted

## 2019-08-21 ENCOUNTER — Encounter: Payer: Self-pay | Admitting: *Deleted

## 2019-08-21 NOTE — Telephone Encounter (Signed)
Requesting gender results. Will send mychart message as results are not in.

## 2019-08-22 ENCOUNTER — Ambulatory Visit (INDEPENDENT_AMBULATORY_CARE_PROVIDER_SITE_OTHER): Payer: Medicaid Other | Admitting: Internal Medicine

## 2019-08-22 ENCOUNTER — Ambulatory Visit: Payer: Medicaid Other | Admitting: *Deleted

## 2019-08-22 ENCOUNTER — Other Ambulatory Visit (HOSPITAL_COMMUNITY)
Admission: RE | Admit: 2019-08-22 | Discharge: 2019-08-22 | Disposition: A | Discharge status: Home / Self Care | Payer: Medicaid Other | Source: Ambulatory Visit | Attending: Internal Medicine | Admitting: Internal Medicine

## 2019-08-22 ENCOUNTER — Other Ambulatory Visit: Payer: Self-pay

## 2019-08-22 VITALS — BP 106/69 | HR 91 | Wt 163.0 lb

## 2019-08-22 DIAGNOSIS — Z6791 Unspecified blood type, Rh negative: Secondary | ICD-10-CM

## 2019-08-22 DIAGNOSIS — O0991 Supervision of high risk pregnancy, unspecified, first trimester: Secondary | ICD-10-CM

## 2019-08-22 DIAGNOSIS — O26899 Other specified pregnancy related conditions, unspecified trimester: Secondary | ICD-10-CM

## 2019-08-22 DIAGNOSIS — Z01419 Encounter for gynecological examination (general) (routine) without abnormal findings: Secondary | ICD-10-CM | POA: Insufficient documentation

## 2019-08-22 DIAGNOSIS — O26891 Other specified pregnancy related conditions, first trimester: Secondary | ICD-10-CM

## 2019-08-22 DIAGNOSIS — O0990 Supervision of high risk pregnancy, unspecified, unspecified trimester: Secondary | ICD-10-CM

## 2019-08-22 DIAGNOSIS — Z3481 Encounter for supervision of other normal pregnancy, first trimester: Secondary | ICD-10-CM

## 2019-08-22 DIAGNOSIS — F1121 Opioid dependence, in remission: Secondary | ICD-10-CM

## 2019-08-22 DIAGNOSIS — F17200 Nicotine dependence, unspecified, uncomplicated: Secondary | ICD-10-CM

## 2019-08-22 DIAGNOSIS — Z8759 Personal history of other complications of pregnancy, childbirth and the puerperium: Secondary | ICD-10-CM

## 2019-08-22 DIAGNOSIS — B1920 Unspecified viral hepatitis C without hepatic coma: Secondary | ICD-10-CM

## 2019-08-22 DIAGNOSIS — Z3A12 12 weeks gestation of pregnancy: Secondary | ICD-10-CM

## 2019-08-22 DIAGNOSIS — O09291 Supervision of pregnancy with other poor reproductive or obstetric history, first trimester: Secondary | ICD-10-CM

## 2019-08-22 MED ORDER — BLOOD PRESSURE MONITOR MISC: 0 refills | Status: DC

## 2019-08-22 NOTE — Patient Instructions (Signed)
First Trimester of Pregnancy ° °The first trimester of pregnancy is from week 1 until the end of week 13 (months 1 through 3). During this time, your baby will begin to develop inside you. At 6-8 weeks, the eyes and face are formed, and the heartbeat can be seen on ultrasound. At the end of 12 weeks, all the baby's organs are formed. Prenatal care is all the medical care you receive before the birth of your baby. Make sure you get good prenatal care and follow all of your doctor's instructions. °Follow these instructions at home: °Medicines °· Take over-the-counter and prescription medicines only as told by your doctor. Some medicines are safe and some medicines are not safe during pregnancy. °· Take a prenatal vitamin that contains at least 600 micrograms (mcg) of folic acid. °· If you have trouble pooping (constipation), take medicine that will make your stool soft (stool softener) if your doctor approves. °Eating and drinking ° °· Eat regular, healthy meals. °· Your doctor will tell you the amount of weight gain that is right for you. °· Avoid raw meat and uncooked cheese. °· If you feel sick to your stomach (nauseous) or throw up (vomit): °? Eat 4 or 5 small meals a day instead of 3 large meals. °? Try eating a few soda crackers. °? Drink liquids between meals instead of during meals. °· To prevent constipation: °? Eat foods that are high in fiber, like fresh fruits and vegetables, whole grains, and beans. °? Drink enough fluids to keep your pee (urine) clear or pale yellow. °Activity °· Exercise only as told by your doctor. Stop exercising if you have cramps or pain in your lower belly (abdomen) or low back. °· Do not exercise if it is too hot, too humid, or if you are in a place of great height (high altitude). °· Try to avoid standing for long periods of time. Move your legs often if you must stand in one place for a long time. °· Avoid heavy lifting. °· Wear low-heeled shoes. Sit and stand up  straight. °· You can have sex unless your doctor tells you not to. °Relieving pain and discomfort °· Wear a good support bra if your breasts are sore. °· Take warm water baths (sitz baths) to soothe pain or discomfort caused by hemorrhoids. Use hemorrhoid cream if your doctor says it is okay. °· Rest with your legs raised if you have leg cramps or low back pain. °· If you have puffy, bulging veins (varicose veins) in your legs: °? Wear support hose or compression stockings as told by your doctor. °? Raise (elevate) your feet for 15 minutes, 3-4 times a day. °? Limit salt in your food. °Prenatal care °· Schedule your prenatal visits by the twelfth week of pregnancy. °· Write down your questions. Take them to your prenatal visits. °· Keep all your prenatal visits as told by your doctor. This is important. °Safety °· Wear your seat belt at all times when driving. °· Make a list of emergency phone numbers. The list should include numbers for family, friends, the hospital, and police and fire departments. °General instructions °· Ask your doctor for a referral to a local prenatal class. Begin classes no later than at the start of month 6 of your pregnancy. °· Ask for help if you need counseling or if you need help with nutrition. Your doctor can give you advice or tell you where to go for help. °· Do not use hot tubs, steam   rooms, or saunas. °· Do not douche or use tampons or scented sanitary pads. °· Do not cross your legs for long periods of time. °· Avoid all herbs and alcohol. Avoid drugs that are not approved by your doctor. °· Do not use any tobacco products, including cigarettes, chewing tobacco, and electronic cigarettes. If you need help quitting, ask your doctor. You may get counseling or other support to help you quit. °· Avoid cat litter boxes and soil used by cats. These carry germs that can cause birth defects in the baby and can cause a loss of your baby (miscarriage) or stillbirth. °· Visit your dentist.  At home, brush your teeth with a soft toothbrush. Be gentle when you floss. °Contact a doctor if: °· You are dizzy. °· You have mild cramps or pressure in your lower belly. °· You have a nagging pain in your belly area. °· You continue to feel sick to your stomach, you throw up, or you have watery poop (diarrhea). °· You have a bad smelling fluid coming from your vagina. °· You have pain when you pee (urinate). °· You have increased puffiness (swelling) in your face, hands, legs, or ankles. °Get help right away if: °· You have a fever. °· You are leaking fluid from your vagina. °· You have spotting or bleeding from your vagina. °· You have very bad belly cramping or pain. °· You gain or lose weight rapidly. °· You throw up blood. It may look like coffee grounds. °· You are around people who have German measles, fifth disease, or chickenpox. °· You have a very bad headache. °· You have shortness of breath. °· You have any kind of trauma, such as from a fall or a car accident. °Summary °· The first trimester of pregnancy is from week 1 until the end of week 13 (months 1 through 3). °· To take care of yourself and your unborn baby, you will need to eat healthy meals, take medicines only if your doctor tells you to do so, and do activities that are safe for you and your baby. °· Keep all follow-up visits as told by your doctor. This is important as your doctor will have to ensure that your baby is healthy and growing well. °This information is not intended to replace advice given to you by your health care provider. Make sure you discuss any questions you have with your health care provider. °Document Released: 05/11/2008 Document Revised: 03/16/2019 Document Reviewed: 12/01/2016 °Elsevier Patient Education © 2020 [employer] ° °

## 2019-08-23 LAB — URINALYSIS, ROUTINE W REFLEX MICROSCOPIC
Bilirubin, UA: NEGATIVE
Glucose, UA: NEGATIVE
Ketones, UA: NEGATIVE
Leukocytes,UA: NEGATIVE
Nitrite, UA: NEGATIVE
RBC, UA: NEGATIVE
Specific Gravity, UA: 1.024 (ref 1.005–1.030)
Urobilinogen, Ur: 1 mg/dL (ref 0.2–1.0)
pH, UA: 6.5 (ref 5.0–7.5)

## 2019-08-24 DIAGNOSIS — F1120 Opioid dependence, uncomplicated: Secondary | ICD-10-CM | POA: Insufficient documentation

## 2019-08-24 DIAGNOSIS — F1121 Opioid dependence, in remission: Secondary | ICD-10-CM | POA: Insufficient documentation

## 2019-08-24 LAB — PMP SCREEN PROFILE (10S), URINE
Amphetamine Scrn, Ur: NEGATIVE ng/mL
BARBITURATE SCREEN URINE: NEGATIVE ng/mL
BENZODIAZEPINE SCREEN, URINE: POSITIVE ng/mL — AB
CANNABINOIDS UR QL SCN: NEGATIVE ng/mL
Cocaine (Metab) Scrn, Ur: NEGATIVE ng/mL
Creatinine(Crt), U: 173.5 mg/dL (ref 20.0–300.0)
Methadone Screen, Urine: NEGATIVE ng/mL
OXYCODONE+OXYMORPHONE UR QL SCN: NEGATIVE ng/mL
Opiate Scrn, Ur: NEGATIVE ng/mL
Ph of Urine: 6.7 (ref 4.5–8.9)
Phencyclidine Qn, Ur: NEGATIVE ng/mL
Propoxyphene Scrn, Ur: NEGATIVE ng/mL

## 2019-08-24 LAB — URINE CULTURE

## 2019-08-24 MED ORDER — ASPIRIN EC 81 MG PO TBEC: 81.0000 mg | DELAYED_RELEASE_TABLET | Freq: Every day | ORAL | 2 refills | Status: DC

## 2019-08-24 NOTE — Progress Notes (Signed)
Subjective:   Dawn Davies is a 28 y.o. G2P1001 at [redacted]w[redacted]d by early ultrasound being seen today for her first obstetrical visit.  Her obstetrical history is significant for history of pre-eclampsia and smoker. Patient does intend to breast feed. Pregnancy history fully reviewed.  Patient reports no complaints.  HISTORY: OB History  Gravida Para Term Preterm AB Living  2 1 1  0 0 1  SAB TAB Ectopic Multiple Live Births  0 0 0 0 0    # Outcome Date GA Lbr Len/2nd Weight Sex Delivery Anes PTL Lv  2 Current           1 Term 02/08/12     Vag-Spont  N    Past Medical History:  Diagnosis Date  . Anxiety   . Reversible cerebrovascular vasoconstriction syndrome 2013   2 weeks postpartum   No past surgical history on file. Family History  Problem Relation Age of Onset  . Diabetes Paternal Grandmother   . Hypertension Mother   . Juvenile idiopathic arthritis Daughter    Social History   Tobacco Use  . Smoking status: Current Every Day Smoker    Packs/day: 0.25    Types: Cigarettes  . Smokeless tobacco: Never Used  Substance Use Topics  . Alcohol use: Not Currently  . Drug use: Not Currently   Allergies  Allergen Reactions  . Hydrocodone-Acetaminophen Other (See Comments)    Reaction not specified by patient Reaction not specified by pat   . Morphine Rash  . Naloxone Nausea And Vomiting  . Varenicline Other (See Comments)    Headaches   Current Outpatient Medications on File Prior to Visit  Medication Sig Dispense Refill  . ALPRAZolam (XANAX) 1 MG tablet Take 1 mg by mouth 3 (three) times daily.    . buprenorphine (SUBUTEX) 2 MG SUBL SL tablet Place 2 mg under the tongue daily.    Marland Kitchen gabapentin (NEURONTIN) 600 MG tablet Take by mouth.    . Prenatal 6.75-0.2 MG TABS Take by mouth.     No current facility-administered medications on file prior to visit.      Exam   Vitals:   08/22/19 1048  BP: 106/69  Pulse: 91  Weight: 73.9 kg    Unable to obtain FHR by  doppler. Visualized to be 140s on ultrasound.   Uterus:   Gravid consistent with dates.   Pelvic Exam: Perineum: no hemorrhoids, normal perineum   Vulva: normal external genitalia, no lesions   Vagina:  normal mucosa, normal discharge   Cervix: no lesions and normal, pap smear done.    Adnexa: normal adnexa and no mass, fullness, tenderness   Bony Pelvis: average  System: General: well-developed, well-nourished female in no acute distress   Breast:  normal appearance, no masses or tenderness   Skin: normal coloration and turgor, no rashes   Neurologic: oriented, normal, negative, normal mood   Extremities: normal strength, tone, and muscle mass, ROM of all joints is normal   HEENT PERRLA, extraocular movement intact and sclera clear, anicteric   Mouth/Teeth mucous membranes moist, pharynx normal without lesions and dental hygiene good   Neck supple and no masses   Cardiovascular: regular rate and rhythm   Respiratory:  no respiratory distress, normal breath sounds   Abdomen: soft, non-tender; bowel sounds normal; no masses,  no organomegaly     Assessment:   Pregnancy: G2P1001 Patient Active Problem List   Diagnosis Date Noted  . Opioid dependence ([place]) 08/24/2019  . Rh  negative, antepartum 08/20/2019  . History of pre-eclampsia 08/18/2019  . Supervision of normal pregnancy 08/18/2019  . Hepatitis C 08/18/2019  . Ovarian cyst 07/24/2019  . History of completed stroke 07/12/2019  . Smoker 07/12/2019  . Moderate episode of recurrent major depressive disorder (HCC) 03/08/2019  . Benzodiazepine dependence (HCC) 09/21/2018  . Seizure disorder (HCC) 06/22/2017  . Generalized anxiety disorder 07/23/2016  . Cerebral vasospasm 03/10/2012  . Leukocytosis 03/10/2012  . Subarachnoid hemorrhage (HCC) 03/03/2012     Plan:  1. Encounter for gynecological examination with Papanicolaou smear of cervix Patient with history of HGSIL and subsequent colposcopy. Reports no f/u since that  exam.  - Cytology - PAP( [place]) - Blood Pressure Monitor MISC; For regular home bp monitoring during pregnancy (Patient not taking: Reported on 08/22/2019)  Dispense: 1 each; Refill: 0  2. Supervision of high risk pregnancy, antepartum - Babyscripts Schedule Optimization - Urine Culture - Urinalysis, Routine w reflex microscopic - Pain Management Screening Profile (10S)  3. Rh negative, antepartum Will need Rhogam at 28 weeks.   4. Opioid dependence in remission (HCC) On Subutex 2.5 mg daily. Reports has not used since ~2014..   5. Smoker Currently smoking 3-4 cigarettes per day. Encouraged continued weaning due to fetal risks with tobacco exposure.   6. Hepatitis C virus infection without hepatic coma, unspecified chronicity Avoid early ROM or FSE during labor course.   7. History of pre-eclampsia BP stable today. Patient has never taken HTN medications to her knowledge.  - aspirin EC 81 MG tablet; Take 1 tablet (81 mg total) by mouth daily.  Dispense: 150 tablet; Refill: 2 -Will need baseline PIH labs.     Return in about 4 weeks (around 09/19/2019) for high risk OB.   De HollingsheadCatherine L Jhonnie Aliano 9:48 AM 08/24/19

## 2019-08-28 ENCOUNTER — Encounter: Payer: Medicaid Other | Admitting: Women's Health

## 2019-08-29 LAB — CYTOLOGY - PAP
Adequacy: ABSENT
Diagnosis: NEGATIVE
HPV 16: NEGATIVE
HPV 18 / 45: POSITIVE — AB
High risk HPV: POSITIVE — AB
Molecular Disclaimer: 56
Molecular Disclaimer: DETECTED
Molecular Disclaimer: NEGATIVE
Molecular Disclaimer: NORMAL
Molecular Disclaimer: NORMAL

## 2019-08-31 LAB — OBSTETRIC PANEL, INCLUDING HIV
Antibody Screen: NEGATIVE
Basophils Absolute: 0 10*3/uL (ref 0.0–0.2)
Basos: 0 %
EOS (ABSOLUTE): 0.4 10*3/uL (ref 0.0–0.4)
Eos: 4 %
HIV Screen 4th Generation wRfx: NONREACTIVE
Hematocrit: 41.3 % (ref 34.0–46.6)
Hemoglobin: 13.8 g/dL (ref 11.1–15.9)
Hepatitis B Surface Ag: NEGATIVE
Immature Grans (Abs): 0 10*3/uL (ref 0.0–0.1)
Immature Granulocytes: 0 %
Lymphocytes Absolute: 2.1 10*3/uL (ref 0.7–3.1)
Lymphs: 23 %
MCH: 29.9 pg (ref 26.6–33.0)
MCHC: 33.4 g/dL (ref 31.5–35.7)
MCV: 89 fL (ref 79–97)
Monocytes Absolute: 0.6 10*3/uL (ref 0.1–0.9)
Monocytes: 7 %
Neutrophils Absolute: 6.2 10*3/uL (ref 1.4–7.0)
Neutrophils: 66 %
Platelets: 301 10*3/uL (ref 150–450)
RBC: 4.62 x10E6/uL (ref 3.77–5.28)
RDW: 12.3 % (ref 11.7–15.4)
RPR Ser Ql: NONREACTIVE
Rh Factor: NEGATIVE
Rubella Antibodies, IGG: 13.7 index (ref >0.99)
WBC: 9.4 10*3/uL (ref 3.4–10.8)

## 2019-08-31 LAB — MATERNIT 21 PLUS CORE, BLOOD
Fetal Fraction: 4
Result (T21): NEGATIVE
Trisomy 13 (Patau syndrome): NEGATIVE
Trisomy 18 (Edwards syndrome): NEGATIVE
Trisomy 21 (Down syndrome): NEGATIVE

## 2019-08-31 LAB — INTEGRATED 1
Crown Rump Length: 58.1 mm
Gest. Age on Collection Date: 12.1 weeks
Maternal Age at EDD: 29.4 yr
Nuchal Translucency (NT): 1.8 mm
Number of Fetuses: 1
PAPP-A Value: 1072.2 ng/mL
Weight: 162 [lb_av]

## 2019-08-31 LAB — HCV RNA QUANT
HCV log10: 5.525 log10 IU/mL
Hepatitis C Quantitation: 335000 IU/mL

## 2019-08-31 LAB — INHERITEST CORE(CF97,SMA,FRAX)

## 2019-08-31 LAB — SICKLE CELL SCREEN: Sickle Cell Screen: NEGATIVE

## 2019-09-01 ENCOUNTER — Encounter: Payer: Self-pay | Admitting: Advanced Practice Midwife

## 2019-09-01 DIAGNOSIS — R87611 Atypical squamous cells cannot exclude high grade squamous intraepithelial lesion on cytologic smear of cervix (ASC-H): Secondary | ICD-10-CM | POA: Insufficient documentation

## 2019-09-19 ENCOUNTER — Encounter: Payer: Medicaid Other | Admitting: Obstetrics & Gynecology

## 2019-09-20 ENCOUNTER — Other Ambulatory Visit: Payer: Self-pay

## 2019-09-20 ENCOUNTER — Encounter: Payer: Self-pay | Admitting: Advanced Practice Midwife

## 2019-09-20 ENCOUNTER — Ambulatory Visit (INDEPENDENT_AMBULATORY_CARE_PROVIDER_SITE_OTHER): Payer: Medicaid Other | Admitting: Advanced Practice Midwife

## 2019-09-20 VITALS — BP 112/71 | HR 100 | Wt 162.5 lb

## 2019-09-20 DIAGNOSIS — Z23 Encounter for immunization: Secondary | ICD-10-CM | POA: Diagnosis not present

## 2019-09-20 DIAGNOSIS — Z331 Pregnant state, incidental: Secondary | ICD-10-CM

## 2019-09-20 DIAGNOSIS — Z1379 Encounter for other screening for genetic and chromosomal anomalies: Secondary | ICD-10-CM

## 2019-09-20 DIAGNOSIS — Z3A16 16 weeks gestation of pregnancy: Secondary | ICD-10-CM

## 2019-09-20 DIAGNOSIS — Z1389 Encounter for screening for other disorder: Secondary | ICD-10-CM

## 2019-09-20 DIAGNOSIS — O0990 Supervision of high risk pregnancy, unspecified, unspecified trimester: Secondary | ICD-10-CM

## 2019-09-20 DIAGNOSIS — Z8759 Personal history of other complications of pregnancy, childbirth and the puerperium: Secondary | ICD-10-CM

## 2019-09-20 DIAGNOSIS — O0992 Supervision of high risk pregnancy, unspecified, second trimester: Secondary | ICD-10-CM

## 2019-09-20 LAB — POCT URINALYSIS DIPSTICK OB
Blood, UA: NEGATIVE
Glucose, UA: NEGATIVE
Ketones, UA: NEGATIVE
Leukocytes, UA: NEGATIVE
Nitrite, UA: NEGATIVE
POC,PROTEIN,UA: NEGATIVE

## 2019-09-20 MED ORDER — ASPIRIN EC 81 MG PO TBEC: 81.0000 mg | DELAYED_RELEASE_TABLET | Freq: Every day | ORAL | 11 refills | Status: DC

## 2019-09-20 NOTE — Progress Notes (Signed)
HIGH-RISK PREGNANCY VISIT Patient name: Dawn Davies MRN [id number]  Date of birth: January 04, 1991 Chief Complaint:   Routine Prenatal Visit (2nd IT)  History of Present Illness:   Dawn Davies is a 28 y.o. G74P1001 female at 91w2dwith an Estimated Date of Delivery: 03/04/20 being seen today for ongoing management of a high-risk pregnancy complicated by postpartum cerebral vasospasm in 2013; she also had a hx of preeclampsia with that delivery. She has a hx of opioid dependence- currently on Subutex 838m(takes 1.5-2.5 tabs qd). She also takes Xanax 31m60mid for a hx of anxiety. Today she reports no complaints. Contractions: Not present. Vag. Bleeding: None.   . denies leaking of fluid.  Review of Systems:   Pertinent items are noted in HPI Denies abnormal vaginal discharge w/ itching/odor/irritation, headaches, visual changes, shortness of breath, chest pain, abdominal pain, severe nausea/vomiting, or problems with urination or bowel movements unless otherwise stated above. Pertinent History Reviewed:  Reviewed past medical,surgical, social, obstetrical and family history.  Reviewed problem list, medications and allergies. Physical Assessment:   Vitals:   09/20/19 1459  BP: 112/71  Pulse: 100  Weight: 162 lb 8 oz (73.7 kg)  Body mass index is 24.71 kg/m.           Physical Examination:   General appearance: alert, well appearing, and in no distress  Mental status: alert, oriented to person, place, and time  Skin: warm & dry   Extremities: Edema: Trace    Cardiovascular: normal heart rate noted  Respiratory: normal respiratory effort, no distress  Abdomen: gravid, soft, non-tender  Pelvic: Cervical exam deferred         Fetal Status: Fetal Heart Rate (bpm): 150         Results for orders placed or performed in visit on 09/20/19 (from the past 24 hour(s))  POC Urinalysis Dipstick OB   Collection Time: 09/20/19  3:00 PM  Result Value Ref Range   Color, UA     Clarity, UA     Glucose, UA Negative Negative   Bilirubin, UA     Ketones, UA neg    Spec Grav, UA     Blood, UA neg    pH, UA     POC,PROTEIN,UA Negative Negative, Trace, Small (1+), Moderate (2+), Large (3+), 4+   Urobilinogen, UA     Nitrite, UA neg    Leukocytes, UA Negative Negative   Appearance     Odor      Assessment & Plan:  1) High-risk pregnancy G2P1001 at 16w72w2dh an Estimated Date of Delivery: 03/04/20   2) Postpartum SAH, stable  3) Hx Pre-e, rx ASA 831mg77m 4) Hepatitis C- CMP today   5) Pap with positive HPV 18/45, no TZ- will get colpo scheduled  6) Hx opioid dependence- on Subutex 8mg t43m (1.5-2.5 tabs qd)  7) Anxiety d/o- takes Xanax 31mg ti70mrec to wean one dose by 0.5mg and46mll f/u at next visit)  Meds:  Meds ordered this encounter  Medications  . aspirin EC 81 MG tablet    Sig: Take 1 tablet (81 mg total) by mouth daily.    Dispense:  30 tablet    Refill:  11    Labs/procedures today: 2nd IT; CMP (for Hep C f/u)  Treatment Plan:  Encouraged to wean off of Xanax as able- start with decreasing to 0.5mg for 28m of her daily doses  Reviewed: Preterm labor symptoms and general obstetric precautions including but not limited  to vaginal bleeding, contractions, leaking of fluid and fetal movement were reviewed in detail with the patient.  All questions were answered. Has home bp cuff. Check bp weekly, let us know if >140/90.   Follow-up: Return in about 2 weeks (around 10/04/2019) for [place], in person, [name]: Anatomy WITH MD. Also to be scheduled for a colposcopy.  Orders Placed This Encounter  Procedures  . Flu Vaccine QUAD 36+ mos IM  . INTEGRATED 2  . Comp Met (CMET)  . POC Urinalysis Dipstick OB   [facility] 09/20/2019 3:49 PM

## 2019-09-20 NOTE — Patient Instructions (Signed)

## 2019-09-27 LAB — INTEGRATED 2
AFP MoM: 1.11
Alpha-Fetoprotein: 36.8 ng/mL
Crown Rump Length: 58.1 mm
DIA MoM: 2.45
DIA Value: 375.9 pg/mL
Estriol, Unconjugated: 0.92 ng/mL
Gest. Age on Collection Date: 12.1 weeks
Gestational Age: 16.9 weeks
Maternal Age at EDD: 29.4 yr
Nuchal Translucency (NT): 1.8 mm
Nuchal Translucency MoM: 1.39
Number of Fetuses: 1
PAPP-A MoM: 1.27
PAPP-A Value: 1072.2 ng/mL
Test Results:: NEGATIVE
Weight: 162 [lb_av]
Weight: 163 [lb_av]
hCG MoM: 0.66
hCG Value: 18.9 IU/mL
uE3 MoM: 0.83

## 2019-09-27 LAB — SPECIMEN STATUS REPORT

## 2019-09-27 LAB — CMBP REQUESTED SENDOUT SPEC

## 2019-09-28 LAB — COMPREHENSIVE METABOLIC PANEL
ALT: 151 IU/L — ABNORMAL HIGH (ref 0–32)
AST: 115 IU/L — ABNORMAL HIGH (ref 0–40)
Albumin/Globulin Ratio: 1.5 (ref 1.2–2.2)
Albumin: 4.1 g/dL (ref 3.9–5.0)
Alkaline Phosphatase: 90 IU/L (ref 39–117)
BUN/Creatinine Ratio: 11 (ref 9–23)
BUN: 6 mg/dL (ref 6–20)
Bilirubin Total: 0.3 mg/dL (ref 0.0–1.2)
CO2: 18 mmol/L — ABNORMAL LOW (ref 20–29)
Calcium: 9.5 mg/dL (ref 8.7–10.2)
Chloride: 104 mmol/L (ref 96–106)
Creatinine, Ser: 0.54 mg/dL — ABNORMAL LOW (ref 0.57–1.00)
GFR calc Af Amer: 149 mL/min/{1.73_m2} (ref >59)
GFR calc non Af Amer: 129 mL/min/{1.73_m2} (ref >59)
Globulin, Total: 2.8 g/dL (ref 1.5–4.5)
Glucose: 89 mg/dL (ref 65–99)
Potassium: 4.7 mmol/L (ref 3.5–5.2)
Sodium: 137 mmol/L (ref 134–144)
Total Protein: 6.9 g/dL (ref 6.0–8.5)

## 2019-09-28 LAB — SPECIMEN STATUS REPORT

## 2019-10-04 ENCOUNTER — Ambulatory Visit (INDEPENDENT_AMBULATORY_CARE_PROVIDER_SITE_OTHER): Payer: Medicaid Other

## 2019-10-04 ENCOUNTER — Ambulatory Visit (INDEPENDENT_AMBULATORY_CARE_PROVIDER_SITE_OTHER): Payer: Medicaid Other | Admitting: Obstetrics and Gynecology

## 2019-10-04 ENCOUNTER — Other Ambulatory Visit: Payer: Self-pay

## 2019-10-04 VITALS — BP 98/64 | HR 83 | Wt 165.8 lb

## 2019-10-04 DIAGNOSIS — F1121 Opioid dependence, in remission: Secondary | ICD-10-CM

## 2019-10-04 DIAGNOSIS — F1991 Other psychoactive substance use, unspecified, in remission: Secondary | ICD-10-CM

## 2019-10-04 DIAGNOSIS — O0990 Supervision of high risk pregnancy, unspecified, unspecified trimester: Secondary | ICD-10-CM

## 2019-10-04 DIAGNOSIS — Z3A18 18 weeks gestation of pregnancy: Secondary | ICD-10-CM

## 2019-10-04 DIAGNOSIS — Z1389 Encounter for screening for other disorder: Secondary | ICD-10-CM

## 2019-10-04 DIAGNOSIS — Z363 Encounter for antenatal screening for malformations: Secondary | ICD-10-CM

## 2019-10-04 DIAGNOSIS — Z3492 Encounter for supervision of normal pregnancy, unspecified, second trimester: Secondary | ICD-10-CM

## 2019-10-04 DIAGNOSIS — Z331 Pregnant state, incidental: Secondary | ICD-10-CM

## 2019-10-04 DIAGNOSIS — Z87898 Personal history of other specified conditions: Secondary | ICD-10-CM

## 2019-10-04 LAB — POCT URINALYSIS DIPSTICK OB
Blood, UA: NEGATIVE
Glucose, UA: NEGATIVE
Ketones, UA: NEGATIVE
Leukocytes, UA: NEGATIVE
Nitrite, UA: NEGATIVE
POC,PROTEIN,UA: NEGATIVE

## 2019-10-04 NOTE — Progress Notes (Signed)
[name] 18+2 wks,breech/cephalic,cx 3.5 cm,normal left ovary,simple right corpus luteal cyst 2.7 x 2.1 x 2.8 cm,svp of fluid 4.7 cm,posterior fundal placenta gr 0,fhr 140 bpm,efw 255 g 71%,anatomy complete,no obvious abnormalities

## 2019-10-04 NOTE — Progress Notes (Signed)
Patient ID: Dawn Davies, female   DOB: Aug 16, 1991, 28 y.o.   MRN: [id number]    [facility] PREGNANCY VISIT Patient name: Dawn Davies MRN [id number]  Date of birth: 1991-01-09 Chief Complaint:   No chief complaint on file.  History of Present Illness:   Dawn Davies is a 28 y.o. G79P1001 female at [redacted]w[redacted]d with an Estimated Date of Delivery: 03/04/20 being seen today for ongoing management of a high-risk pregnancy complicated by postpartum cerebral vasospasm in 2013; she also had a hx of preeclampsia with that delivery. She has a hx of opioid dependence- currently on Subutex 20mg .She also takes Xanax 1mg  tid for a hx of anxiety, that she attributes to worrying about the possibility of recurrent cerebral vasospasm as she had last pregnancy. She is trying to ween off anxiety meds, and aware of the need to get off of these benzodiazepines. Some days she can do two a day and other days she can't. She takes her BP everyday. Had single episode of dizziness and BP drop when riding with her partner in 18 wheeler. She doesn't drive much. Today she reports no complaints. Contractions: Not present. Vag. Bleeding: None.  Movement: Present. denies leaking of fluid.  Review of Systems:   Pertinent items are noted in HPI Denies abnormal vaginal discharge w/ itching/odor/irritation, headaches, visual changes, shortness of breath, chest pain, abdominal pain, severe nausea/vomiting, or problems with urination or bowel movements unless otherwise stated above. Pertinent History Reviewed:  Reviewed past medical,surgical, social, obstetrical and family history.  Reviewed problem list, medications and allergies. Physical Assessment:   Vitals:   10/04/19 1448  BP: 98/64  Pulse: 83  Weight: 165 lb 12.8 oz (75.2 kg)  Body mass index is 25.21 kg/m.           Physical Examination:   General appearance: alert, well appearing, and in no distress  Mental status: alert, oriented to person, place, and time, normal mood,  behavior, speech, dress, motor activity, and thought processes  Skin: warm & dry   Extremities:      Cardiovascular: normal heart rate noted  Respiratory: normal respiratory effort, no distress  Abdomen: gravid, soft, non-tender  Pelvic: Cervical exam deferred         Fetal Status:     Movement: Present    Fetal Surveillance Testing today: 18+2 wks,breech/cephalic,cx 3.5 cm,normal left ovary,simple right corpus luteal cyst 2.7 x 2.1 x 2.8 cm,svp of fluid 4.7 cm,posterior fundal placenta gr 0,fhr 140 bpm,efw 255 g 71%,anatomy complete,no obvious abnormalities   Chaperone: 10/06/19    Results for orders placed or performed in visit on 10/04/19 (from the past 24 hour(s))  POC Urinalysis Dipstick OB   Collection Time: 10/04/19  2:50 PM  Result Value Ref Range   Color, UA     Clarity, UA     Glucose, UA Negative Negative   Bilirubin, UA     Ketones, UA n    Spec Grav, UA     Blood, UA n    pH, UA     POC,PROTEIN,UA Negative Negative, Trace, Small (1+), Moderate (2+), Large (3+), 4+   Urobilinogen, UA     Nitrite, UA n    Leukocytes, UA Negative Negative   Appearance     Odor      Assessment & Plan:  1) High-risk pregnancy G2P1001 at [redacted]w[redacted]d with an Estimated Date of Delivery: 03/04/20   2) Hx of opioid dependance, 8mg  in the morning, , 8 mg in the evening, 4 mg  as needed currently using approximately 20 mg daily  3) Hx of pre-eclampsia, stable  4) Hep C, plans for treatment after delivery.   Meds: No orders of the defined types were placed in this encounter.   Labs/procedures today: UDS on urine, u/s  Treatment Plan:  Increase ASA to 2 pills 162 mg/d  Follow-up: Return in about 4 weeks (around 11/01/2019) for in person.  Orders Placed This Encounter  Procedures  . POC Urinalysis Dipstick OB   By signing my name below, I, Samul Dada, attest that this documentation has been prepared under the direction and in the presence of Jonnie Kind, MD. Electronically  Signed: [place]. 10/04/19. 3:44 PM.  I personally performed the services described in this documentation, which was SCRIBED in my presence. The recorded information has been reviewed and considered accurate. It has been edited as necessary during review. Jonnie Kind, MD

## 2019-10-05 LAB — PMP SCREEN PROFILE (10S), URINE
Amphetamine Scrn, Ur: NEGATIVE ng/mL
BARBITURATE SCREEN URINE: NEGATIVE ng/mL
BENZODIAZEPINE SCREEN, URINE: POSITIVE ng/mL — AB
CANNABINOIDS UR QL SCN: NEGATIVE ng/mL
Cocaine (Metab) Scrn, Ur: NEGATIVE ng/mL
Creatinine(Crt), U: 100.9 mg/dL (ref 20.0–300.0)
Methadone Screen, Urine: NEGATIVE ng/mL
OXYCODONE+OXYMORPHONE UR QL SCN: NEGATIVE ng/mL
Opiate Scrn, Ur: NEGATIVE ng/mL
Ph of Urine: 6.7 (ref 4.5–8.9)
Phencyclidine Qn, Ur: NEGATIVE ng/mL
Propoxyphene Scrn, Ur: NEGATIVE ng/mL

## 2019-10-05 LAB — SPECIMEN STATUS REPORT

## 2019-10-12 ENCOUNTER — Ambulatory Visit: Payer: Medicaid Other | Admitting: Internal Medicine

## 2019-10-13 ENCOUNTER — Ambulatory Visit: Payer: Medicaid Other | Admitting: Internal Medicine

## 2019-10-13 DIAGNOSIS — Z5329 Procedure and treatment not carried out because of patient's decision for other reasons: Secondary | ICD-10-CM

## 2019-10-13 NOTE — Progress Notes (Signed)
NO SHOW  Patient ID: Dawn Davies, female   DOB: 1991-01-04, 28 y.o.   MRN: [id number]    HPI  Ellenore Roscoe is a 28 y.o.-year-old female, referred by her PCP, Dr. Neta Mends, for evaluation and management of thyrotoxicosis in pregnancy. She saw Dr. Hartford Poli on 08/23/2019 but he would like to switch to seeing a woman endocrinologist.  She is currently [redacted] weeks pregnant.  When she saw Dr. Hartford Poli, she was seen week 5.  At that time, she had mild morning with no hyperemesis gravidarum.    I reviewed pt's thyroid tests: 07/26/2019: TSH 0.09 (0.45-4.5)  No results found for: TSH, FREET4, T3FREE  Antithyroid antibodies: No results found for: TSI  Pt denies: - feeling nodules in neck - hoarseness - dysphagia - choking - SOB with lying down  She has longstanding history of - Anxiety - Insomnia Which increased proximately 2 years ago.  She also complains of: - fatigue - excessive sweating/heat intolerance - tremors - palpitations - hyperdefecation - weight loss - hair loss  Pt does not have a FH of thyroid ds. No FH of thyroid cancer. No h/o radiation tx to head or neck.  No seaweed or kelp, no recent contrast studies. No steroid use. No herbal supplements. No Biotin use.  Pt. also has a history of transaminitis: 07/26/2019: AST/ALT 54/86 At that time, she had a normal neutrophile count.  She also has a history of seizures and is on Neurontin and Xanax.  She is also on buprenorphine for opioid use disorder.  ROS: Constitutional: + see HPI Eyes: no blurry vision, no xerophthalmia ENT: no sore throat, + see HPI Cardiovascular: no CP/SOB/palpitations/leg swelling Respiratory: no cough/SOB Gastrointestinal: no N/V/D/C Musculoskeletal: no muscle/joint aches Skin: no rashes Neurological: no tremors/numbness/tingling/dizziness Psychiatric: no depression/anxiety  PE: LMP 05/19/2019 (Exact Date)  Wt Readings from Last 3 Encounters:  10/04/19 165 lb 12.8 oz (75.2 kg)  09/20/19  162 lb 8 oz (73.7 kg)  08/22/19 163 lb (73.9 kg)   Constitutional: overweight, in NAD Eyes: PERRLA, EOMI, no exophthalmos, no lid lag, no stare ENT: moist mucous membranes, no thyromegaly, no thyroid bruits, no cervical lymphadenopathy Cardiovascular: RRR, No MRG Respiratory: CTA B Gastrointestinal: abdomen soft, NT, ND, BS+ Musculoskeletal: no deformities, strength intact in all 4 Skin: moist, warm, no rashes Neurological: no tremor with outstretched hands, DTR normal in all 4  ASSESSMENT: 1. Thyrotoxicosis - in pregnant pt.  PLAN:  1. Patient with a recently found low TSH, with thyrotoxic sxs: weight loss, heat intolerance, hyperdefecation, palpitations, anxiety.  - she does not appear to have exogenous causes for the low TSH.  - We discussed that possible causes of thyrotoxicosis in pregnancy are:  . Graves ds   . Gestational transient thyrotoxicosis . Thyroiditis . toxic multinodular goiter/ toxic adenoma (I cannot feel nodules at palpation of her thyroid). - will check the TSH, fT3 and fT4 and also add thyroid stimulating antibodies to screen for Graves' disease.  Dr. Hartford Poli checked her TRAP antibodies but I do not have these results. - We cannot check a thyroid uptake and scan for her, which would be ideal to differentiate between the above etiologies, since she is pregnant - we discussed about possible modalities of treatment for the above conditions, to include PTU use (for the first trimester), methimazole use (for the second and third trimesters and postpartum), or (last resort) surgery in the second trimester.  RAI treatment is contraindicated for her - we may need to do thyroid ultrasound depending on  the results - I do not feel that we need to add beta blockers at this time, since she is not tachycardic or tremulous - no signs of Graves' ophthalmopathy: she does not have any double vision, blurry vision, eye pain, chemosis. - RTC in 3 months, but likely sooner for repeat  labs  Carlus Pavlov, MD PhD [facility] Endocrinology

## 2019-10-18 ENCOUNTER — Encounter: Payer: Self-pay | Admitting: Internal Medicine

## 2019-10-19 ENCOUNTER — Encounter: Payer: Self-pay | Admitting: Internal Medicine

## 2019-10-19 ENCOUNTER — Other Ambulatory Visit: Payer: Self-pay

## 2019-10-19 ENCOUNTER — Ambulatory Visit: Payer: Medicaid Other | Admitting: Internal Medicine

## 2019-10-19 VITALS — BP 100/60 | HR 94 | Ht 68.0 in | Wt 165.0 lb

## 2019-10-19 DIAGNOSIS — O99280 Endocrine, nutritional and metabolic diseases complicating pregnancy, unspecified trimester: Secondary | ICD-10-CM | POA: Diagnosis not present

## 2019-10-19 DIAGNOSIS — E0590 Thyrotoxicosis, unspecified without thyrotoxic crisis or storm: Secondary | ICD-10-CM | POA: Diagnosis not present

## 2019-10-19 LAB — FREE THYROXINE INDEX: Free Thyroxine Index: 1.7

## 2019-10-19 LAB — T4 (THYROXINE), TOTAL (REFL): T4, Total: 12.8

## 2019-10-19 LAB — T3 UPTAKE: T3 Uptake: 13

## 2019-10-19 LAB — TSH: TSH: 1.28

## 2019-10-19 NOTE — Patient Instructions (Addendum)
Please come back for labs in 4 weeks.  Please come back for a follow-up appointment in 3 months.

## 2019-10-19 NOTE — Progress Notes (Signed)
Patient ID: Dawn Davies, female   DOB: May 06, 1991, 28 y.o.   MRN: [id number]    HPI  Dawn Davies is a 28 y.o.-year-old female, referred by her PCP, Dr. Neta Mends, for evaluation and management of thyrotoxicosis in pregnancy. She saw Dr. Hartford Poli on 08/23/2019 but he would like to switch to seeing a woman endocrinologist.  She is currently [redacted] weeks pregnant.  When she saw Dr. Hartford Poli, she was in week 31.  At that time, she had mild morning sickness, but no hyperemesis gravidarum.  I reviewed pt's thyroid tests: 10/18/2019: TSH 1.28, T4 12.8 (4.5-12)  Prev.:   Antithyroid antibodies: 10/18/2019: TRAb <1.1 No results found for: TSI  Pt denies: - feeling nodules in neck - hoarseness - dysphagia - choking, but occasionally food gets stuck - SOB with lying down  She has longstanding history of anxiety and insomnia, which increased approximately 2 years ago. She is now tapering Xanax for the pregnancy >> she is not tolerating the diet very well: Mind racing, lack of appetite, paces the floor.   She also mentions: - + fatigue - + weight gain - started 4 years ago: + 40 lbs - + extremities are cold but heat intolerance - no tremors - + palpitations - no hyperdefecation - no weight loss - + hair loss - chronic  Pt does have a FH of thyroid ds.: mother with thyroid nodule and hyperthyroidism, aunts also with thyroid ds.  No FH of thyroid cancer. No h/o radiation tx to head or neck.  No seaweed or kelp, no recent contrast studies. No steroid use. No herbal supplements. No Biotin use.  Pt. also has a history of transaminitis: 07/26/2019: AST/ALT 54/86 She was recently found to have positive HCV antibodies and was referred to infectious disease.  At that time, she had a normal neutrophile count.  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  She also has a history of seizures (last was 2019) and is on Neurontin and Xanax.  She is also on buprenorphine for opioid use disorder.   ROS: Constitutional: + see HPI Eyes: no blurry vision, no xerophthalmia ENT: no sore throat, + see HPI Cardiovascular: no CP/SOB/+ palpitations/+ leg swelling Respiratory: no cough/SOB Gastrointestinal: no N/V/D/+ C Musculoskeletal: no muscle/joint aches Skin: no rashes Neurological: + Tremors/no numbness/tingling/dizziness Psychiatric: + Both depression/anxiety  Past Medical History:  Diagnosis Date  . Anxiety   . Reversible cerebrovascular vasoconstriction syndrome 2013   2 weeks postpartum   History reviewed. No pertinent surgical history. Social History   Socioeconomic History  . Marital status: Legally Separated    Spouse name: Not on file  . Number of children: 1  . Years of education: Not on file  . Highest education level: Not on file  Occupational History  . Not on file  Social Needs  . Financial resource strain: Not on file  . Food insecurity    Worry: Not on file    Inability: Not on file  . Transportation needs    Medical: Not on file    Non-medical: Not on file  Tobacco Use  . Smoking status: Current Every Day Smoker    Packs/day: 0.25    Types: Cigarettes  . Smokeless tobacco: Never Used  Substance and Sexual Activity  . Alcohol use: Not Currently  . Drug use: Not Currently  . Sexual activity: Yes    Birth control/protection: None  Lifestyle  . Physical activity    Days per week: Not on file  Minutes per session: Not on file  . Stress: Not on file  Relationships  . Social Musician on phone: Not on file    Gets together: Not on file    Attends religious service: Not on file    Active member of club or organization: Not on file    Attends meetings of clubs or organizations: Not on file    Relationship status: Not on file  . Intimate partner violence    Fear of current or ex partner: Not on file    Emotionally abused: Not on file    Physically abused: Not on file    Forced sexual activity: Not on file  Other Topics Concern  .  Not on file  Social History Narrative   Pt is left handed   Lives in 2 story home with her daughter, brother and both parents   10th grade education   Has 1 child   On disability   Current Outpatient Medications on File Prior to Visit  Medication Sig Dispense Refill  . ALPRAZolam (XANAX) 1 MG tablet Take 1 mg by mouth 3 (three) times daily.    Marland Kitchen aspirin EC 81 MG tablet Take 1 tablet (81 mg total) by mouth daily. 30 tablet 11  . Blood Pressure Monitor MISC For regular home bp monitoring during pregnancy 1 each 0  . buprenorphine (SUBUTEX) 2 MG SUBL SL tablet Place 8 mg under the tongue daily. Takes 2.5 tabs daily    . gabapentin (NEURONTIN) 600 MG tablet Take 600 mg by mouth 2 (two) times daily.     . Prenatal 6.75-0.2 MG TABS Take by mouth.     No current facility-administered medications on file prior to visit.    Allergies  Allergen Reactions  . Hydrocodone-Acetaminophen Other (See Comments)    Reaction not specified by patient Reaction not specified by pat   . Morphine Rash  . Naloxone Nausea And Vomiting  . Varenicline Other (See Comments)    Xyzal-Headaches   Family History  Problem Relation Age of Onset  . Diabetes Paternal Grandmother   . Hypertension Mother   . Juvenile idiopathic arthritis Daughter    PE: BP 100/60   Pulse 94   Ht 5\' 8"  (1.727 m)   Wt 165 lb (74.8 kg)   LMP 05/19/2019 (Exact Date)   SpO2 97%   BMI 25.09 kg/m  Wt Readings from Last 3 Encounters:  10/19/19 165 lb (74.8 kg)  10/04/19 165 lb 12.8 oz (75.2 kg)  09/20/19 162 lb 8 oz (73.7 kg)   Constitutional: Pregnant appearing, in NAD Eyes: PERRLA, EOMI, no exophthalmos, no lid lag, no stare ENT: moist mucous membranes, + mild symmetric thyromegaly, no thyroid bruits, no cervical lymphadenopathy Cardiovascular: Tachycardia RR, No MRG, + slight periankle nonpitting edema Respiratory: CTA B Gastrointestinal: gravid abdomen soft, NT, ND, BS+ Musculoskeletal: no deformities, strength intact in  all 4 Skin: moist, warm, no rashes Neurological: + Mild tremor with outstretched hands, DTR normal in all 4  ASSESSMENT: 1. Thyrotoxicosis -In pregnant patient  PLAN:  1. Patient with a recently found low TSH in the first weeks of pregnancy, with possible thyrotoxic sxs: Heat intolerance (hot also cold intolerance), palpitations, increased anxiety (but she is also tapering Xanax down), insomnia, hair loss (chronic). - she does not appear to have exogenous causes for the low TSH.  -She initially saw another endocrinologist in week 12th of pregnancy but would like a second opinion.  She missed her last appointment  with me approximately 1 week ago. -She had repeat labs yesterday and the TSH returned normal now with the total T4 only slightly elevated.  Of note, TRAb antibodies were undetectable. - We discussed that possible causes of thyrotoxicosis in pregnancy are:  Marland Kitchen Gestational transient thyrotoxicosis (GTT) - transient, usually mild condition seen at the beginning of the pregnancy and resolving without intervention usually towards the end of the pregnancy.  No treatment is needed usually for this.  The fact that her thyrotropin receptor antibodies were not elevated points more towards this condition. Luiz Blare ds -autoimmune disease, however, her TRAb antibodies are not elevated . Thyroiditis -she denies exposure URI or upper respiratory symptoms before her thyrotoxicosis diagnosis . toxic multinodular goiter/ toxic adenoma -I cannot feel nodules at palpation of her thyroid -At today's visit, we reviewed her tests and I explained that the most likely diagnosis is GTT.  It is very fortunate that her TSH normalized and she does not need medication currently.  However, the patient is disappointed that she cannot be treated for the condition, since she feels that the majority of her symptoms are related to the thyroid.  Her symptoms started before her pregnancy and, indeed, she did have a slightly low  TSH in 2018, however, this normalized in 2019.  We discussed that as of now, no treatment is indicated, but we decided to check her test again in 4 weeks to see how her tests evolve.at that time, will check the TSH, fT3 and fT4 and also add thyroid stimulating antibodies - we discussed about possible modalities of treatment for the above conditions, to include PTU use for the first trimester and methimazole use afterwards or (last resort) surgery.  RAI treatment is contraindicated during the pregnancy and breast-feeding. - we may need to do thyroid ultrasound depending on the results - I would like to hold off adding beta blockers at this time.  She has only mild tachycardia, which can be related to pregnancy and also to her anxiety, especially in the setting of decreasing Xanax.  I feel that this place quite an important role in her symptoms. - no signs of Graves' ophthalmopathy: she does not have any double vision, blurry vision, eye pain, chemosis. - I advised her to join my chart to communicate easier - RTC in 3 months, but  sooner for repeat labs  Orders Placed This Encounter  Procedures  . xtpit - TSH  . xtpit - free T4  . xtpit - free T3  . Thyroid stimulating immunoglobulin   Component     Latest Ref Rng & Units 11/17/2019  TSI     <140 % baseline <89  Triiodothyronine,Free,Serum     2.3 - 4.2 pg/mL 2.7  T4,Free(Direct)     0.60 - 1.60 ng/dL 1.61  TSH     0.96 - 0.45 uIU/mL 0.24 (L)   The TSH is again slightly low.  Normal free T3 or free T4.  Graves' antibodies are not elevated.  At this point, the stronger suspicion is for a possible toxic nodule versus recurrent thyroiditis.  Unfortunately, we cannot check a thyroid uptake and scan during pregnancy.  Also, I would like to be very judicious using thionamides only absolutely needed due to the possible side effects on the baby.  Since we cannot check a thyroid uptake and scan, I would like to check a thyroid ultrasound to evaluate  the thyroid blood flow.  If this is increased suggesting Graves' disease or if she has a  nodule that may be hot, I would suggest a low-dose methimazole.  Carlus Pavlovristina Marks Scalera, MD PhD [facility]

## 2019-10-20 ENCOUNTER — Encounter: Payer: Self-pay | Admitting: Internal Medicine

## 2019-10-23 ENCOUNTER — Telehealth: Payer: Self-pay | Admitting: *Deleted

## 2019-10-23 ENCOUNTER — Encounter: Payer: Self-pay | Admitting: *Deleted

## 2019-10-23 NOTE — Telephone Encounter (Signed)
Patient called with c/o cough and congestion.  Wants to know what she can take. Will send mychart message with med list.

## 2019-10-31 ENCOUNTER — Encounter: Payer: Medicaid Other | Admitting: Women's Health

## 2019-11-06 ENCOUNTER — Encounter: Payer: Medicaid Other | Admitting: Women's Health

## 2019-11-07 ENCOUNTER — Telehealth: Payer: Medicaid Other | Admitting: Women's Health

## 2019-11-08 ENCOUNTER — Telehealth (INDEPENDENT_AMBULATORY_CARE_PROVIDER_SITE_OTHER): Payer: Medicaid Other | Admitting: Advanced Practice Midwife

## 2019-11-08 ENCOUNTER — Encounter: Payer: Self-pay | Admitting: Advanced Practice Midwife

## 2019-11-08 VITALS — BP 112/65 | HR 97 | Ht 68.0 in

## 2019-11-08 DIAGNOSIS — Z3A23 23 weeks gestation of pregnancy: Secondary | ICD-10-CM

## 2019-11-08 DIAGNOSIS — O0990 Supervision of high risk pregnancy, unspecified, unspecified trimester: Secondary | ICD-10-CM

## 2019-11-08 NOTE — Progress Notes (Signed)
TELEHEALTH VIRTUAL OBSTETRICS VISIT ENCOUNTER NOTE Patient name: Dawn Davies MRN [id number]  Date of birth: [birth year]  I connected with patient on 11/08/19 at  9:10 AM EST by MyChart and verified that I am speaking with the correct person using two identifiers. Due to COVID-19 recommendations, pt is not currently in our office.    I discussed the limitations, risks, security and privacy concerns of performing an evaluation and management service by telephone and the availability of in person appointments. I also discussed with the patient that there may be a patient responsible charge related to this service. The patient expressed understanding and agreed to proceed.  Chief Complaint:   High Risk Gestation  History of Present Illness:   Dawn Davies is a 28 y.o. 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 She has a hx of opioid dependence- currently on Subutex 20mg  total qd. She also takes Xanax 1mg  bid-tid for a hx of anxiety- attempting to wean by third tri.  Pt with questions re mode of delivery- wondering if C/S would be 'safer'. Also would like to use Depo for pp contraception- has been on for 'years'- is bone density a concern?  Today she reports no complaints. Contractions: Not present. Vag. Bleeding: None.  Movement: Present. denies leaking of fluid. Review of Systems:   Pertinent items are noted in HPI Denies abnormal vaginal discharge w/ itching/odor/irritation, headaches, visual changes, shortness of breath, chest pain, abdominal pain, severe nausea/vomiting, or problems with urination or bowel movements unless otherwise stated above. Pertinent History Reviewed:  Reviewed past medical,surgical, social, obstetrical and family history.  Reviewed problem list,  medications and allergies. Physical Assessment:   Vitals:   11/08/19 0911 11/08/19 0914  BP:  112/65  Pulse:  97  Height: 5\' 8"  (1.727 m)   Body mass index is 25.09 kg/m.        Physical Examination:   General:  Alert, oriented and cooperative.   Mental Status: Normal mood and affect perceived. Normal judgment and thought content.  Rest of physical exam deferred due to type of encounter  No results found for this or any previous visit (from the past 24 hour(s)).  Assessment & Plan:  1) High-risk pregnancy G2P1001 at [redacted]w[redacted]d with an Estimated Date of Delivery: 03/04/20   2) Hx of cerebral vasospasm with 2013 delivery, no indication for anticoagulant therapy; pt with questions re C/S vs vag del- will review with MD at next visit  3) Hx of pre-e, taking ASA  4) Hepatitis C, CMP q trimester- due with PN2  5) Rh neg, Rhogam at next visit  6) Pap with +HPV, needs colpo (was supposed to be scheduled after visit 10/14- will get with next visit)  7) Hx opioid dependence, on Subutex 20mg  total qd  8) Anxiety d/o, Xanax 1mg  bid/tid, trying to wean  9) Prefers Depo for PP contraception, has been on for 'years'- concerns for bone density decrease?   Meds: No orders of the defined types were placed in this encounter.   Labs/procedures today: none  Plan:  Continue routine obstetrical care with colposcopy next visit.  Has home bp cuff.  Check bp weekly, let 03/06/20 know if >140/90.  Next visit: prefers will be in person for PN2, CMP, UDS, Rhogam, colposcopy    Reviewed: Preterm labor symptoms and general  obstetric precautions including but not limited to vaginal bleeding, contractions, leaking of fluid and fetal movement were reviewed in detail with the patient. The patient was advised to call back or seek an in-person office evaluation/go to [facility] at [facility] for any urgent or concerning symptoms. All questions were answered. Please refer to After Visit Summary for other  counseling recommendations.    I provided 12 minutes of non-face-to-face time during this encounter.  Follow-up: No follow-ups on file.  No orders of the defined types were placed in this encounter.  Myrtis Ser CNM 11/08/2019 9:47 AM

## 2019-11-16 ENCOUNTER — Ambulatory Visit (INDEPENDENT_AMBULATORY_CARE_PROVIDER_SITE_OTHER): Payer: Medicaid Other | Admitting: Women's Health

## 2019-11-16 ENCOUNTER — Other Ambulatory Visit: Payer: Self-pay

## 2019-11-16 ENCOUNTER — Encounter: Payer: Self-pay | Admitting: Women's Health

## 2019-11-16 VITALS — BP 112/73 | HR 47 | Temp 97.8°F | Wt 164.0 lb

## 2019-11-16 DIAGNOSIS — O0992 Supervision of high risk pregnancy, unspecified, second trimester: Secondary | ICD-10-CM

## 2019-11-16 DIAGNOSIS — Z3A24 24 weeks gestation of pregnancy: Secondary | ICD-10-CM

## 2019-11-16 DIAGNOSIS — B3731 Acute candidiasis of vulva and vagina: Secondary | ICD-10-CM

## 2019-11-16 DIAGNOSIS — B1920 Unspecified viral hepatitis C without hepatic coma: Secondary | ICD-10-CM

## 2019-11-16 DIAGNOSIS — Z1389 Encounter for screening for other disorder: Secondary | ICD-10-CM

## 2019-11-16 DIAGNOSIS — J069 Acute upper respiratory infection, unspecified: Secondary | ICD-10-CM

## 2019-11-16 DIAGNOSIS — Z331 Pregnant state, incidental: Secondary | ICD-10-CM

## 2019-11-16 DIAGNOSIS — B373 Candidiasis of vulva and vagina: Secondary | ICD-10-CM

## 2019-11-16 DIAGNOSIS — O0990 Supervision of high risk pregnancy, unspecified, unspecified trimester: Secondary | ICD-10-CM

## 2019-11-16 DIAGNOSIS — O2342 Unspecified infection of urinary tract in pregnancy, second trimester: Secondary | ICD-10-CM

## 2019-11-16 LAB — POCT URINALYSIS DIPSTICK OB
Blood, UA: 3
Glucose, UA: NEGATIVE
Ketones, UA: NEGATIVE

## 2019-11-16 MED ORDER — CEPHALEXIN 500 MG PO CAPS: 500.0000 mg | ORAL_CAPSULE | Freq: Four times a day (QID) | ORAL | 0 refills | Status: DC

## 2019-11-16 NOTE — Patient Instructions (Signed)
You can try a 7 night over the counter yeast cream such as Monistat 7. It does not need to be brand name, generic is fine, but please make sure it is a 7 night cream.   

## 2019-11-16 NOTE — Progress Notes (Signed)
HIGH-RISK PREGNANCY VISIT Patient name: Dawn Davies MRN [id number]  Date of birth: [birth year] Chief Complaint:   work-in-ob (leaking fluid) and High Risk Gestation  History of Present Illness:   Dawn Davies is a 28 y.o. G86P1001 female at [redacted]w[redacted]d with an Estimated Date of Delivery: 03/04/20 being seen today for ongoing management of a high-risk pregnancy complicated by HepC, seizures, xanax use, subutex.  Today she reports leaking clear fluid since 1930 last night, had to change underwear 5 times b/c they were wet. Has stopped this morning. +vaginal itching, got itching cream last night. No contractions, vb. Good fm. Bad odor to urine. Rt lower back pain. No fever/chills. Cough, congestion x 1wk, no other sx, wants antibiotic. Contractions: Not present.  .  Movement: Present. reports leaking of fluid.  Review of Systems:   Pertinent items are noted in HPI Denies abnormal vaginal discharge w/ itching/odor/irritation, headaches, visual changes, shortness of breath, chest pain, abdominal pain, severe nausea/vomiting, or problems with urination or bowel movements unless otherwise stated above. Pertinent History Reviewed:  Reviewed past medical,surgical, social, obstetrical and family history.  Reviewed problem list, medications and allergies. Physical Assessment:   Vitals:   11/16/19 1015  BP: 112/73  Pulse: (!) 47  Temp: 97.8 F (36.6 C)  Weight: 164 lb (74.4 kg)  Body mass index is 24.94 kg/m.  Temp 98.7           Physical Examination:   General appearance: alert, well appearing, and in no distress  Mental status: alert, oriented to person, place, and time  Skin: warm & dry   Extremities: Edema: None    Cardiovascular: normal heart rate noted  Respiratory: normal respiratory effort, no distress  Abdomen: gravid, soft, non-tender  Back: No CVAT  Pelvic: SSE: cx visually long/closed, no pooling, no change w/ valsalva, thick clumpy d/c c/w yeast, small amount thinner milky discharge        fern: neg  Fetal Status: Fetal Heart Rate (bpm): 147 Fundal Height: 24 cm Movement: Present    Fetal Surveillance Testing today: doppler   Chaperone: Amanda Rash    Results for orders placed or performed in visit on 11/16/19 (from the past 24 hour(s))  POC Urinalysis Dipstick OB   Collection Time: 11/16/19 10:21 AM  Result Value Ref Range   Color, UA cloudy    Clarity, UA     Glucose, UA Negative Negative   Bilirubin, UA     Ketones, UA neg    Spec Grav, UA     Blood, UA 3    pH, UA     POC,PROTEIN,UA Small (1+) Negative, Trace, Small (1+), Moderate (2+), Large (3+), 4+   Urobilinogen, UA     Nitrite, UA postive    Leukocytes, UA Moderate (2+) (A) Negative   Appearance     Odor      Assessment & Plan:  1) High-risk pregnancy G2P1001 at [redacted]w[redacted]d with an Estimated Date of Delivery: 03/04/20   2) Leaking fluid, no evidence of ROM, no pooling, fern neg. Likely from yeast infection  3) Vulvovaginal yeast, use otc monistat 7  4) UTI> afebrile, no CVAT, rx keflex (to possibly help w/ URI as well)  Meds:  Meds ordered this encounter  Medications   cephALEXin (KEFLEX) 500 MG capsule    Sig: Take 1 capsule (500 mg total) by mouth 4 (four) times daily. X 7 days    Dispense:  28 capsule    Refill:  0    Order Specific Question:  Supervising Provider    Answer:   Lazaro Arms [[id number]]    Labs/procedures today: spec exam, fern  Treatment Plan:  As scheduled  Reviewed: Preterm labor symptoms and general obstetric precautions including but not limited to vaginal bleeding, contractions, leaking of fluid and fetal movement were reviewed in detail with the patient.  All questions were answered.   Follow-up: Return for As scheduled.  Orders Placed This Encounter  Procedures   Urine Culture   POC Urinalysis Dipstick OB   Cheral Marker CNM, [facility] 11/16/2019 10:58 AM

## 2019-11-17 ENCOUNTER — Other Ambulatory Visit (INDEPENDENT_AMBULATORY_CARE_PROVIDER_SITE_OTHER): Payer: Medicaid Other

## 2019-11-17 DIAGNOSIS — O99280 Endocrine, nutritional and metabolic diseases complicating pregnancy, unspecified trimester: Secondary | ICD-10-CM | POA: Diagnosis not present

## 2019-11-17 DIAGNOSIS — E0590 Thyrotoxicosis, unspecified without thyrotoxic crisis or storm: Secondary | ICD-10-CM | POA: Diagnosis not present

## 2019-11-17 LAB — T3, FREE: T3, Free: 2.7 pg/mL (ref 2.3–4.2)

## 2019-11-17 LAB — T4, FREE: Free T4: 0.77 ng/dL (ref 0.60–1.60)

## 2019-11-17 LAB — TSH: TSH: 0.24 u[IU]/mL — ABNORMAL LOW (ref 0.35–4.50)

## 2019-11-18 ENCOUNTER — Encounter: Payer: Self-pay | Admitting: Internal Medicine

## 2019-11-18 LAB — URINE CULTURE

## 2019-11-20 ENCOUNTER — Encounter: Payer: Self-pay | Admitting: Internal Medicine

## 2019-11-21 LAB — THYROID STIMULATING IMMUNOGLOBULIN: TSI: <89 % baseline (ref <140)

## 2019-11-28 ENCOUNTER — Other Ambulatory Visit: Payer: Self-pay | Admitting: Internal Medicine

## 2019-12-04 ENCOUNTER — Telehealth: Payer: Self-pay | Admitting: Internal Medicine

## 2019-12-04 ENCOUNTER — Other Ambulatory Visit: Payer: Self-pay | Admitting: Internal Medicine

## 2019-12-04 NOTE — Telephone Encounter (Signed)
Spoke with Tera, let her know that the pts pcp thru [place] access is putting in the order and the auth for this [name] today

## 2019-12-04 NOTE — Telephone Encounter (Signed)
Tera calling from [employer] calling about Authorization for Thryoid Scan on 12/07/2019.  Her call back info is [phone] ext [phone]

## 2019-12-07 ENCOUNTER — Other Ambulatory Visit: Payer: Medicaid Other

## 2019-12-07 ENCOUNTER — Ambulatory Visit
Admission: RE | Admit: 2019-12-07 | Discharge: 2019-12-07 | Disposition: A | Discharge status: Home / Self Care | Payer: Medicaid Other | Source: Ambulatory Visit | Attending: Internal Medicine | Admitting: Internal Medicine

## 2019-12-07 ENCOUNTER — Encounter: Payer: Self-pay | Admitting: Internal Medicine

## 2019-12-07 ENCOUNTER — Encounter: Payer: Medicaid Other | Admitting: Obstetrics & Gynecology

## 2019-12-07 DIAGNOSIS — O99280 Endocrine, nutritional and metabolic diseases complicating pregnancy, unspecified trimester: Secondary | ICD-10-CM

## 2019-12-07 DIAGNOSIS — E0590 Thyrotoxicosis, unspecified without thyrotoxic crisis or storm: Secondary | ICD-10-CM

## 2019-12-08 NOTE — L&D Delivery Note (Addendum)
OB/GYN Faculty Practice Delivery Note  Dawn Davies is a 28 y.o. G2P1001 s/p VD at [redacted]w[redacted]d. She was admitted for SOL.   ROM: 0h 46m with clear fluid GBS Status:  --Theda Sers (03/02 1428)  Labor Progress: . Patient presented to L&D for SOL. Initial SVE: 3.5/70/-2. She continued to progress in labor on her own and was eventually started on a small dose of pitocin after slowed period of progression. She then progressed to complete.   Delivery Date/Time: 02/29/20; 1012 Delivery: Called to room and patient was complete and pushing. Head delivered LOA. No nuchal cord present. Shoulder and body delivered in usual fashion. Infant with spontaneous cry, placed on mother's abdomen, dried and stimulated. Cord clamped x 2 after 1-minute delay, and cut by FOB. Cord blood drawn. Placenta delivered spontaneously with gentle cord traction. Fundus firm with massage and Pitocin. Labia, perineum, vagina, and cervix inspected inspected with right labial abrasion that was hemostatically stable.   Baby Weight: Per chart  Placenta: Sent to L&D Complications: None Lacerations: None, small right labial abrasion QBL: 51 Analgesia: Epidural   Infant: APGAR (1 MIN): 9   APGAR (5 MINS): 9      Ryan Welborn, DO 02/29/2020, 10:25 AM   I was gloved and present for entire delivery SVD without incident No difficulty with shoulders Lacerations as listed above Repair of same supervised by me  Newborn weight: 2985g  Clayton Bibles, CNM 02/29/20 12:38 PM

## 2019-12-12 ENCOUNTER — Other Ambulatory Visit: Payer: Self-pay

## 2019-12-12 ENCOUNTER — Other Ambulatory Visit: Payer: Medicaid Other

## 2019-12-12 ENCOUNTER — Ambulatory Visit (INDEPENDENT_AMBULATORY_CARE_PROVIDER_SITE_OTHER): Payer: Medicaid Other | Admitting: Obstetrics & Gynecology

## 2019-12-12 ENCOUNTER — Encounter: Payer: Self-pay | Admitting: Obstetrics & Gynecology

## 2019-12-12 VITALS — BP 106/67 | HR 85 | Wt 163.0 lb

## 2019-12-12 DIAGNOSIS — O2343 Unspecified infection of urinary tract in pregnancy, third trimester: Secondary | ICD-10-CM

## 2019-12-12 DIAGNOSIS — Z3A28 28 weeks gestation of pregnancy: Secondary | ICD-10-CM

## 2019-12-12 DIAGNOSIS — O0990 Supervision of high risk pregnancy, unspecified, unspecified trimester: Secondary | ICD-10-CM

## 2019-12-12 DIAGNOSIS — R87810 Cervical high risk human papillomavirus (HPV) DNA test positive: Secondary | ICD-10-CM

## 2019-12-12 DIAGNOSIS — B1920 Unspecified viral hepatitis C without hepatic coma: Secondary | ICD-10-CM

## 2019-12-12 DIAGNOSIS — O98513 Other viral diseases complicating pregnancy, third trimester: Secondary | ICD-10-CM

## 2019-12-12 DIAGNOSIS — O2342 Unspecified infection of urinary tract in pregnancy, second trimester: Secondary | ICD-10-CM

## 2019-12-12 DIAGNOSIS — O0993 Supervision of high risk pregnancy, unspecified, third trimester: Secondary | ICD-10-CM

## 2019-12-12 LAB — POCT URINALYSIS DIPSTICK OB
Blood, UA: NEGATIVE
Glucose, UA: NEGATIVE
Ketones, UA: NEGATIVE
Nitrite, UA: POSITIVE

## 2019-12-12 MED ORDER — NITROFURANTOIN MONOHYD MACRO 100 MG PO CAPS: 100.0000 mg | ORAL_CAPSULE | Freq: Two times a day (BID) | ORAL | 0 refills | Status: DC

## 2019-12-12 NOTE — Progress Notes (Signed)
   HIGH-RISK PREGNANCY VISIT Patient name: Dawn Davies MRN [id number]  Date of birth: 06-17-1991 Chief Complaint:   Routine Prenatal Visit (colpo)  History of Present Illness:   Dawn Davies is a 29 y.o. G2P1001 female at [redacted]w[redacted]d with an Estimated Date of Delivery: 03/04/20 being seen today for ongoing management of a high-risk pregnancy complicated by +Hep C, subutex maintainenece therapy.  Today she reports no complaints. Contractions: Not present. Vag. Bleeding: None.  Movement: Present. denies leaking of fluid.  Review of Systems:   Pertinent items are noted in HPI Denies abnormal vaginal discharge w/ itching/odor/irritation, headaches, visual changes, shortness of breath, chest pain, abdominal pain, severe nausea/vomiting, or problems with urination or bowel movements unless otherwise stated above. Pertinent History Reviewed:  Reviewed past medical,surgical, social, obstetrical and family history.  Reviewed problem list, medications and allergies. Physical Assessment:   Vitals:   12/12/19 1511  BP: 106/67  Pulse: 85  Weight: 163 lb (73.9 kg)  Body mass index is 24.78 kg/m.           Physical Examination:   General appearance: alert, well appearing, and in no distress  Mental status: alert, oriented to person, place, and time  Skin: warm & dry   Extremities: Edema: None    Cardiovascular: normal heart rate noted  Respiratory: normal respiratory effort, no distress  Abdomen: gravid, soft, non-tender  Pelvic: Cervical exam deferred         Fetal Status: Fetal Heart Rate (bpm): 140 Fundal Height: 28 cm Movement: Present    Fetal Surveillance Testing today: FHR 140   Chaperone: n/a    Results for orders placed or performed in visit on 12/12/19 (from the past 24 hour(s))  POC Urinalysis Dipstick OB   Collection Time: 12/12/19  3:19 PM  Result Value Ref Range   Color, UA     Clarity, UA     Glucose, UA Negative Negative   Bilirubin, UA     Ketones, UA neg    Spec Grav, UA      Blood, UA neg    pH, UA     POC,PROTEIN,UA Small (1+) Negative, Trace, Small (1+), Moderate (2+), Large (3+), 4+   Urobilinogen, UA     Nitrite, UA positive    Leukocytes, UA Trace (A) Negative   Appearance     Odor      Assessment & Plan:  1) High-risk pregnancy G2P1001 at [redacted]w[redacted]d with an Estimated Date of Delivery: 03/04/20   2) UTI, treat with macrobid, treated with keflex previously, stable  3) +HPV 18, could not see cervix weel, very anterior, colpo 8 weeks post partum,   Meds:  Meds ordered this encounter  Medications  . nitrofurantoin, macrocrystal-monohydrate, (MACROBID) 100 MG capsule    Sig: Take 1 capsule (100 mg total) by mouth 2 (two) times daily.    Dispense:  14 capsule    Refill:  0    Labs/procedures today: PN2  Treatment Plan:  Routine PNC  Reviewed: Preterm labor symptoms and general obstetric precautions including but not limited to vaginal bleeding, contractions, leaking of fluid and fetal movement were reviewed in detail with the patient.  All questions were answered.  home bp cuff. Rx faxed to . Check bp weekly, let us know if >140/90.   Follow-up: Return in about 3 weeks (around 01/02/2020) for HROB.  Orders Placed This Encounter  Procedures  . Urine Culture  . POC Urinalysis Dipstick OB   Lazaro Arms  12/12/2019 3:57 PM

## 2019-12-13 LAB — COMPREHENSIVE METABOLIC PANEL WITH GFR
ALT: 48 [IU]/L — ABNORMAL HIGH (ref 0–32)
AST: 41 [IU]/L — ABNORMAL HIGH (ref 0–40)
Albumin/Globulin Ratio: 1.7 (ref 1.2–2.2)
Albumin: 4 g/dL (ref 3.9–5.0)
Alkaline Phosphatase: 124 [IU]/L — ABNORMAL HIGH (ref 39–117)
BUN/Creatinine Ratio: 12 (ref 9–23)
BUN: 8 mg/dL (ref 6–20)
Bilirubin Total: 0.3 mg/dL (ref 0.0–1.2)
CO2: 18 mmol/L — ABNORMAL LOW (ref 20–29)
Calcium: 8.9 mg/dL (ref 8.7–10.2)
Chloride: 106 mmol/L (ref 96–106)
Creatinine, Ser: 0.68 mg/dL (ref 0.57–1.00)
GFR calc Af Amer: 137 mL/min/{1.73_m2}
GFR calc non Af Amer: 119 mL/min/{1.73_m2}
Globulin, Total: 2.3 g/dL (ref 1.5–4.5)
Glucose: 81 mg/dL (ref 65–99)
Potassium: 4.1 mmol/L (ref 3.5–5.2)
Sodium: 138 mmol/L (ref 134–144)
Total Protein: 6.3 g/dL (ref 6.0–8.5)

## 2019-12-13 LAB — CBC
Hematocrit: 34.8 % (ref 34.0–46.6)
Hemoglobin: 11.8 g/dL (ref 11.1–15.9)
MCH: 32.1 pg (ref 26.6–33.0)
MCHC: 33.9 g/dL (ref 31.5–35.7)
MCV: 95 fL (ref 79–97)
Platelets: 272 10*3/uL (ref 150–450)
RBC: 3.68 x10E6/uL — ABNORMAL LOW (ref 3.77–5.28)
RDW: 12.1 % (ref 11.7–15.4)
WBC: 13 10*3/uL — ABNORMAL HIGH (ref 3.4–10.8)

## 2019-12-13 LAB — GLUCOSE TOLERANCE, 2 HOURS W/ 1HR
Glucose, 1 hour: 148 mg/dL (ref 65–179)
Glucose, 2 hour: 101 mg/dL (ref 65–152)
Glucose, Fasting: 80 mg/dL (ref 65–91)

## 2019-12-13 LAB — HIV ANTIBODY (ROUTINE TESTING W REFLEX): HIV Screen 4th Generation wRfx: NONREACTIVE

## 2019-12-13 LAB — ANTIBODY SCREEN: Antibody Screen: NEGATIVE

## 2019-12-13 LAB — SYPHILIS: RPR W/REFLEX TO RPR TITER AND TREPONEMAL ANTIBODIES, TRADITIONAL SCREENING AND DIAGNOSIS ALGORITHM: RPR Ser Ql: NONREACTIVE

## 2019-12-14 ENCOUNTER — Other Ambulatory Visit: Payer: Self-pay | Admitting: Internal Medicine

## 2019-12-14 DIAGNOSIS — O99280 Endocrine, nutritional and metabolic diseases complicating pregnancy, unspecified trimester: Secondary | ICD-10-CM

## 2019-12-14 DIAGNOSIS — E0590 Thyrotoxicosis, unspecified without thyrotoxic crisis or storm: Secondary | ICD-10-CM

## 2019-12-15 ENCOUNTER — Other Ambulatory Visit (INDEPENDENT_AMBULATORY_CARE_PROVIDER_SITE_OTHER): Payer: Medicaid Other

## 2019-12-15 DIAGNOSIS — E0590 Thyrotoxicosis, unspecified without thyrotoxic crisis or storm: Secondary | ICD-10-CM

## 2019-12-15 DIAGNOSIS — O99280 Endocrine, nutritional and metabolic diseases complicating pregnancy, unspecified trimester: Secondary | ICD-10-CM | POA: Diagnosis not present

## 2019-12-15 LAB — URINE CULTURE

## 2019-12-15 LAB — T3, FREE: T3, Free: 3.3 pg/mL (ref 2.3–4.2)

## 2019-12-15 LAB — TSH: TSH: 0.34 u[IU]/mL — ABNORMAL LOW (ref 0.35–4.50)

## 2019-12-15 LAB — T4, FREE: Free T4: 0.79 ng/dL (ref 0.60–1.60)

## 2019-12-18 ENCOUNTER — Other Ambulatory Visit: Payer: Medicaid Other

## 2020-01-02 ENCOUNTER — Ambulatory Visit (INDEPENDENT_AMBULATORY_CARE_PROVIDER_SITE_OTHER): Payer: Medicaid Other | Admitting: Obstetrics and Gynecology

## 2020-01-02 ENCOUNTER — Encounter: Payer: Self-pay | Admitting: Obstetrics and Gynecology

## 2020-01-02 ENCOUNTER — Other Ambulatory Visit: Payer: Self-pay

## 2020-01-02 VITALS — BP 116/77 | HR 106 | Temp 98.3°F | Wt 165.3 lb

## 2020-01-02 DIAGNOSIS — Z6791 Unspecified blood type, Rh negative: Secondary | ICD-10-CM

## 2020-01-02 DIAGNOSIS — O0990 Supervision of high risk pregnancy, unspecified, unspecified trimester: Secondary | ICD-10-CM

## 2020-01-02 DIAGNOSIS — Z1389 Encounter for screening for other disorder: Secondary | ICD-10-CM

## 2020-01-02 DIAGNOSIS — O26893 Other specified pregnancy related conditions, third trimester: Secondary | ICD-10-CM

## 2020-01-02 DIAGNOSIS — O0993 Supervision of high risk pregnancy, unspecified, third trimester: Secondary | ICD-10-CM

## 2020-01-02 DIAGNOSIS — O360930 Maternal care for other rhesus isoimmunization, third trimester, not applicable or unspecified: Secondary | ICD-10-CM | POA: Diagnosis not present

## 2020-01-02 DIAGNOSIS — Z331 Pregnant state, incidental: Secondary | ICD-10-CM

## 2020-01-02 DIAGNOSIS — Z3A31 31 weeks gestation of pregnancy: Secondary | ICD-10-CM

## 2020-01-02 LAB — POCT URINALYSIS DIPSTICK OB
Blood, UA: NEGATIVE
Glucose, UA: NEGATIVE
Ketones, UA: NEGATIVE

## 2020-01-02 MED ORDER — RHO D IMMUNE GLOBULIN 1500 UNIT/2ML IJ SOSY
300.0000 ug | PREFILLED_SYRINGE | Freq: Once | INTRAMUSCULAR | Status: AC
  Administered 2020-01-02: 300 ug via INTRAMUSCULAR

## 2020-01-02 MED ORDER — NITROFURANTOIN MACROCRYSTAL 100 MG PO CAPS: 100.0000 mg | ORAL_CAPSULE | Freq: Every day | ORAL | 1 refills | Status: DC

## 2020-01-02 MED ORDER — SULFAMETHOXAZOLE-TRIMETHOPRIM 400-80 MG PO TABS: 1.0000 | ORAL_TABLET | Freq: Two times a day (BID) | ORAL | 0 refills | Status: DC

## 2020-01-02 NOTE — Progress Notes (Addendum)
Patient ID: Bambi Fehnel, female   DOB: February 09, 1991, 29 y.o.   MRN: [id number]    [facility] PREGNANCY VISIT Patient name: Dawn Davies MRN [id number]  Date of birth: [birth year] Chief Complaint:   High Risk Gestation  History of Present Illness:   Dawn Davies is a 29 y.o. G43P1001 female at [redacted]w[redacted]d with an Estimated Date of Delivery: 03/04/20 being seen today for ongoing management of a high-risk pregnancy complicated by +Hep C, & subutex maintenance 8 mg tablet 2 and 1/2 pills daily. Today she reports no complaints.  .  .   . denies leaking of fluid.  Review of Systems:   Pertinent items are noted in HPI Denies abnormal vaginal discharge w/ itching/odor/irritation, headaches, visual changes, shortness of breath, chest pain, abdominal pain, severe nausea/vomiting, or problems with urination or bowel movements unless otherwise stated above. Pertinent History Reviewed:  Reviewed past medical,surgical, social, obstetrical and family history.  Reviewed problem list, medications and allergies. Physical Assessment:  There were no vitals filed for this visit.There is no height or weight on file to calculate BMI.           Physical Examination:   General appearance: alert, well appearing, and in no distress  Mental status: alert, oriented to person, place, and time, normal mood, behavior, speech, dress, motor activity, and thought processes, affect appropriate to mood  Skin: warm & dry   Extremities:      Cardiovascular: normal heart rate noted  Respiratory: normal respiratory effort, no distress  Abdomen: gravid, soft, non-tender  Pelvic: Cervical exam deferred         Fetal Status:          Fetal Surveillance Testing today: None   Chaperone: Arnette Norris    No results found for this or any previous visit (from the past 24 hour(s)).  Assessment & Plan:  1) High-risk pregnancy G2P1001 at [redacted]w[redacted]d with an Estimated Date of Delivery: 03/04/20   2) +Hep C, stable, TSH on 12/15/2019 was 0.34  3)  Subutex maintenance , stable   4) UTI,  septra DS x 1 wk, macrodantin 100 mg hs after completion of bactrim for duration of pregnancy   Meds: No orders of the defined types were placed in this encounter.   Labs/procedures today: Rhogam, UA  Treatment Plan:  F/u in 2 weeks  Follow-up: No follow-ups on file.  No orders of the defined types were placed in this encounter.  By signing my name below, I, Arnette Norris, attest that this documentation has been prepared under the direction and in the presence of Tilda Burrow, MD. Electronically Signed: Arnette Norris Medical Scribe. 01/02/20. 3:12 PM.  I personally performed the services described in this documentation, which was SCRIBED in my presence. The recorded information has been reviewed and considered accurate. It has been edited as necessary during review. Tilda Burrow, MD

## 2020-01-04 LAB — URINE CULTURE

## 2020-01-16 ENCOUNTER — Ambulatory Visit (INDEPENDENT_AMBULATORY_CARE_PROVIDER_SITE_OTHER): Payer: Medicaid Other | Admitting: Obstetrics & Gynecology

## 2020-01-16 ENCOUNTER — Other Ambulatory Visit: Payer: Self-pay

## 2020-01-16 ENCOUNTER — Encounter: Payer: Self-pay | Admitting: Obstetrics & Gynecology

## 2020-01-16 VITALS — BP 124/82 | HR 84 | Wt 171.5 lb

## 2020-01-16 DIAGNOSIS — O99320 Drug use complicating pregnancy, unspecified trimester: Secondary | ICD-10-CM

## 2020-01-16 DIAGNOSIS — O0990 Supervision of high risk pregnancy, unspecified, unspecified trimester: Secondary | ICD-10-CM

## 2020-01-16 DIAGNOSIS — B1920 Unspecified viral hepatitis C without hepatic coma: Secondary | ICD-10-CM

## 2020-01-16 DIAGNOSIS — F1120 Opioid dependence, uncomplicated: Secondary | ICD-10-CM

## 2020-01-16 DIAGNOSIS — O0993 Supervision of high risk pregnancy, unspecified, third trimester: Secondary | ICD-10-CM

## 2020-01-16 DIAGNOSIS — Z3A33 33 weeks gestation of pregnancy: Secondary | ICD-10-CM

## 2020-01-16 DIAGNOSIS — O99323 Drug use complicating pregnancy, third trimester: Secondary | ICD-10-CM

## 2020-01-16 NOTE — Progress Notes (Signed)
   HIGH-RISK PREGNANCY VISIT Patient name: Dawn Davies MRN [id number]  Date of birth: 1991-01-07 Chief Complaint:   Routine Prenatal Visit  History of Present Illness:   Dawn Davies is a 29 y.o. G14P1001 female at [redacted]w[redacted]d with an Estimated Date of Delivery: 03/04/20 being seen today for ongoing management of a high-risk pregnancy complicated by +Hep C with minimally elevated LFTs, subutex maintenance, bordrline hyperthyroidism with nodules.  Today she reports no complaints. Contractions: Not present. Vag. Bleeding: None.  Movement: Present. denies leaking of fluid.  Review of Systems:   Pertinent items are noted in HPI Denies abnormal vaginal discharge w/ itching/odor/irritation, headaches, visual changes, shortness of breath, chest pain, abdominal pain, severe nausea/vomiting, or problems with urination or bowel movements unless otherwise stated above. Pertinent History Reviewed:  Reviewed past medical,surgical, social, obstetrical and family history.  Reviewed problem list, medications and allergies. Physical Assessment:   Vitals:   01/16/20 1114  BP: 124/82  Pulse: 84  Weight: 171 lb 8 oz (77.8 kg)  Body mass index is 26.08 kg/m.           Physical Examination:   General appearance: alert, well appearing, and in no distress  Mental status: alert, oriented to person, place, and time  Skin: warm & dry   Extremities: Edema: None    Cardiovascular: normal heart rate noted  Respiratory: normal respiratory effort, no distress  Abdomen: gravid, soft, non-tender  Pelvic: Cervical exam deferred         Fetal Status:     Movement: Present    Fetal Surveillance Testing today: FHR 146   Chaperone: n/a    No results found for this or any previous visit (from the past 24 hour(s)).  Assessment & Plan:  1) High-risk pregnancy G2P1001 at [redacted]w[redacted]d with an Estimated Date of Delivery: 03/04/20   2) Hep C, stable, improved LFTs repeat 36 weeks  3) recurrent UTI, stable, on nightly  macrobid  4) subutex maintenance going well  Meds: No orders of the defined types were placed in this encounter.   Labs/procedures today:   Treatment Plan:  Repeat LFTs 36 weeks  Reviewed: Preterm labor symptoms and general obstetric precautions including but not limited to vaginal bleeding, contractions, leaking of fluid and fetal movement were reviewed in detail with the patient.  All questions were answered.  home bp cuff. Rx faxed to . Check bp weekly, let Dawn Davies know if >140/90.   Follow-up: Return in about 3 weeks (around 02/06/2020) for HROB.  No orders of the defined types were placed in this encounter.  [name]  01/16/2020 11:59 AM

## 2020-01-25 ENCOUNTER — Encounter: Payer: Self-pay | Admitting: Internal Medicine

## 2020-01-25 ENCOUNTER — Ambulatory Visit (INDEPENDENT_AMBULATORY_CARE_PROVIDER_SITE_OTHER): Payer: Medicaid Other | Admitting: Internal Medicine

## 2020-01-25 DIAGNOSIS — E0590 Thyrotoxicosis, unspecified without thyrotoxic crisis or storm: Secondary | ICD-10-CM

## 2020-01-25 DIAGNOSIS — E042 Nontoxic multinodular goiter: Secondary | ICD-10-CM

## 2020-01-25 DIAGNOSIS — O99280 Endocrine, nutritional and metabolic diseases complicating pregnancy, unspecified trimester: Secondary | ICD-10-CM

## 2020-01-25 NOTE — Progress Notes (Signed)
Patient ID: Dawn Davies, female   DOB: March 22, 1991, 29 y.o.   MRN: [id number]   Patient location: Home My location: Office Persons participating in the virtual visit: patient, provider  Referring Provider: Vivien Presto, MD  I connected with the patient on 01/25/20 at 10:59 AM EST by a video enabled telemedicine application and verified that I am speaking with the correct person.   I discussed the limitations of evaluation and management by telemedicine and the availability of in person appointments. The patient expressed understanding and agreed to proceed.   Details of the encounter are shown below.  HPI  Dawn Davies is a 29 y.o.-year-old female, initially referred by her PCP, Dr. Salvadore Farber, returning for follow-up for thyrotoxicosis in pregnancy and also thyroid nodules diagnosed after our last visit. She saw Dr. Shawnee Knapp on 08/23/2019 but he would like to switch to seeing a woman endocrinologist.  Last visit with me 3 months ago.  Reviewed and addended history: When I last saw her, she was [redacted] weeks pregnant.  When she saw Dr. Shawnee Knapp, she was in week 12.  At that time, she had mild morning sickness, but no hyperemesis gravidarum.  At our last visit, we checked her thyroid tests and they were all normal, including Graves' antibodies.  Working diagnosis at that time was resolved subacute thyroiditis versus gestational transient thyrotoxicosis.  We repeated her test a month later and the TSH was slightly low.  Free T4 and free T3 were normal.  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  We checked a thyroid ultrasound (12/07/2019): 3 small nodules, 2 meeting criteria for follow-up; no increased blood flow, heterogeneous appearance of the thyroid Parenchymal Echotexture: Mildly heterogenous Isthmus: 5 mm Right lobe: 6.1 x 1.9 x 2.8 cm Left lobe: 5.6 x 1.8 x 2.4 cm  Nodule # 1: Location: Right; Superior Maximum size: 1.7 cm; Other 2 dimensions: 0.9  x 1.4 cm Composition: solid/almost completely solid (2) Echogenicity: isoechoic (1) *Given size (>/= 1.5 - 2.4 cm) and appearance, a follow-up ultrasound in 1 year should be considered based on TI-RADS criteria. _________________________________________________________  Nodule # 2: Location: Left; Superior Maximum size: 1.0 cm; Other 2 dimensions: 0.6 x 0.9 cm Composition: mixed cystic and solid (1) Echogenicity: isoechoic (1) This nodule does NOT meet TI-RADS criteria for biopsy or dedicated follow-up. _________________________________________________________  Nodule # 3: Location: Left; Inferior Patient maximum size: 1.4 cm; Other 2 dimensions: 0.8 x 1.1 cm Composition: solid/almost completely solid (2) Echogenicity: isoechoic (1) Margins: extra-thyroidal extension (3) *Given size (>/= 1 - 1.4 cm) and appearance, a follow-up ultrasound in 1 year should be considered based on TI-RADS criteria. _________________________________________________________  Minor nonspecific thyroid heterogeneity. No hypervascularity. No regional adenopathy.  IMPRESSION: 1.7 cm right superior TR 3 nodule and 1.4 cm left inferior TR 4 nodule. Both meet criteria for follow-up in 1 year. Currently no biopsy indicated.  We rechecked her thyroid tests in 12/2019 and a TSH was improved, very slightly low, with normal free thyroid hormones.  I reviewed her TFTs: Lab Results  Component Value Date   TSH 0.34 (L) 12/15/2019   TSH 0.24 (L) 11/17/2019   TSH 1.280 10/18/2019   FREET4 0.79 12/15/2019   FREET4 0.77 11/17/2019   T3FREE 3.3 12/15/2019   T3FREE 2.7 11/17/2019   10/18/2019: TSH 1.28, T4 12.8 (4.5-12)  Previously:   Her antithyroid antibodies were not elevated: 10/18/2019: TRAb <1.1 Lab Results  Component Value Date   TSI <89 11/17/2019   Pt  mentions: - dysphagia with anxiety  She denies: - hoarseness - dysphagia - choking - SOB with lying down  At last visit, patient  was describing longstanding history of anxiety and insomnia, increased approximately 30 years prior to the appointment.  She was tapering Xanax during the pregnancy and she was not tolerating it very well: Mind racing, lack of appetite, pacing the floor.  She was convinced that the symptoms are related to her thyroid condition.  She continues to experience: -Fatigue -Weight gain- started 4 years ago: + 40 lbs -Heat intolerance with cold extremities -Tremors -Palpitations -Chronic hair loss -and anxiety  Pt does have a FH of thyroid ds.: mother with thyroid nodule and hyperthyroidism, 3 aunts also with thyroid ds.  No FH of thyroid cancer. No h/o radiation tx to head or neck.  No seaweed or kelp. No recent contrast studies. No herbal supplements. No Biotin use. No recent steroids use.   She has a history of transaminitis: 07/26/2019: AST/ALT 54/86 She was found to have positive HCV antibodies and was referred to infectious disease.  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  She also has a history of seizures (last was 2019) and is on Neurontin and Xanax.  She is also on buprenorphine for opioid use disorder.  ROS: Constitutional: + see HPI Eyes: no blurry vision, no xerophthalmia ENT: no sore throat, + see HPI Cardiovascular: no CP/no SOB/+ palpitations/+ leg swelling Respiratory: no cough/no SOB/no wheezing Gastrointestinal: no N/no V/no D/no C/no acid reflux Musculoskeletal: no muscle aches/no joint aches Skin: no rashes, no hair loss Neurological: + tremors/no numbness/no tingling/no dizziness  I reviewed pt's medications, allergies, PMH, social hx, family hx, and changes were documented in the history of present illness. Otherwise, unchanged from my initial visit note.  Past Medical History:  Diagnosis Date  . Anxiety   . Reversible cerebrovascular vasoconstriction syndrome 2013   2 weeks postpartum   No past surgical history on file. Social History    Socioeconomic History  . Marital status: Legally Separated    Spouse name: Not on file  . Number of children: 1  . Years of education: Not on file  . Highest education level: Not on file  Occupational History  . Not on file  Tobacco Use  . Smoking status: Current Every Day Smoker    Packs/day: 0.25    Types: Cigarettes  . Smokeless tobacco: Never Used  Substance and Sexual Activity  . Alcohol use: Not Currently  . Drug use: Not Currently  . Sexual activity: Yes    Birth control/protection: None  Other Topics Concern  . Not on file  Social History Narrative   Pt is left handed   Lives in 2 story home with her daughter, brother and both parents   10th grade education   Has 1 child   On disability   Social Determinants of Health   Financial Resource Strain:   . Difficulty of Paying Living Expenses: Not on file  Food Insecurity:   . Worried About Programme researcher, broadcasting/film/video in the Last Year: Not on file  . Ran Out of Food in the Last Year: Not on file  Transportation Needs:   . Lack of Transportation (Medical): Not on file  . Lack of Transportation (Non-Medical): Not on file  Physical Activity:   . Days of Exercise per Week: Not on file  . Minutes of Exercise per Session: Not on file  Stress:   . Feeling of Stress :  Not on file  Social Connections:   . Frequency of Communication with Friends and Family: Not on file  . Frequency of Social Gatherings with Friends and Family: Not on file  . Attends Religious Services: Not on file  . Active Member of Clubs or Organizations: Not on file  . Attends Archivist Meetings: Not on file  . Marital Status: Not on file  Intimate Partner Violence:   . Fear of Current or Ex-Partner: Not on file  . Emotionally Abused: Not on file  . Physically Abused: Not on file  . Sexually Abused: Not on file   Current Outpatient Medications on File Prior to Visit  Medication Sig Dispense Refill  . ALPRAZolam (XANAX) 1 MG tablet Take 1 mg  by mouth 3 (three) times daily.    Marland Kitchen aspirin EC 81 MG tablet Take 1 tablet (81 mg total) by mouth daily. 30 tablet 11  . Blood Pressure Monitor MISC For regular home bp monitoring during pregnancy 1 each 0  . buprenorphine (SUBUTEX) 2 MG SUBL SL tablet Place 8 mg under the tongue daily. Takes 2.5 tabs daily    . gabapentin (NEURONTIN) 600 MG tablet Take 600 mg by mouth 2 (two) times daily.     . nitrofurantoin (MACRODANTIN) 100 MG capsule Take 1 capsule (100 mg total) by mouth at bedtime. Begin at completion of septra 30 capsule 1  . Prenatal 6.75-0.2 MG TABS Take by mouth.     No current facility-administered medications on file prior to visit.   Allergies  Allergen Reactions  . Hydrocodone-Acetaminophen Other (See Comments)    Reaction not specified by patient Reaction not specified by pat   . Morphine Rash  . Naloxone Nausea And Vomiting  . Varenicline Other (See Comments)    Xyzal-Headaches   Family History  Problem Relation Age of Onset  . Diabetes Paternal Grandmother   . Hypertension Mother   . Juvenile idiopathic arthritis Daughter    PE: LMP 05/19/2019 (Exact Date)  Wt Readings from Last 3 Encounters:  01/16/20 171 lb 8 oz (77.8 kg)  01/02/20 165 lb 4.8 oz (75 kg)  12/12/19 163 lb (73.9 kg)   Constitutional:  in NAD  The physical exam was not performed (virtual visit).  ASSESSMENT: 1. Thyrotoxicosis -In pregnant patient  2.  Thyroid nodules  PLAN:  1. Patient with fluctuating TSH level from slightly low normal to normal range, with normal free thyroid hormones.  She did not require treatment.  Patient is pregnant, which limits the extent of the investigation. -So far, her thyroid antibodies have not been elevated, a thyroid ultrasound did not show increase thyroid blood flow but did show heterogeneity.  She also has 3 small thyroid nodules which will need to be investigated by another ultrasound in a year. -Our initial differential diagnosis was gestational  transient thyrotoxicosis versus Graves' disease versus thyroiditis versus toxic multinodular goiter/toxic adenoma;  our current working diagnoses are: Subclinical thyroiditis versus mild toxic adenoma/toxic multinodular goiter.  I explained that thyroiditis usually resolves spontaneously in few months.  For toxic nodules, usually, treatment is needed in the form of RAI treatment or surgery.  However, in situations with mild elevation of thyroid hormones, we can just observe the patients.  In her case, we could check a thyroid uptake and scan after she gives birth, but I a treatment will be problematic with a small baby. -She has an array of symptoms which could be seen in thyrotoxicosis, but in other conditions as well.  She continues to have heat intolerance but also cold intolerance, palpitations, anxiety (chronic), insomnia, hair loss, (chronic). -We again discussed that due to the mild nature of her thyroid condition no treatment is needed for now especially since her last set of tests were so close to the normal range. -For now, we will see her back in 4 weeks after pregnancy for recheck of her thyroid tests - RTC in 4-5 months for another visit  2.  Thyroid nodules -No neck compression symptoms except for dysphagia with anxiety - possibly globus sensation -Discovered incidentally during investigation for thyrotoxicosis -We will plan to repeat another thyroid ultrasound a year from the previous -We will likely need a thyroid uptake and scan after pregnancy  Orders Placed This Encounter  Procedures  . TSH  . T4, free  . T3, free    Carlus Pavlov, MD PhD [facility] Endocrinology

## 2020-01-25 NOTE — Patient Instructions (Addendum)
Please return in 4-5 months for another visit.  Please come back for labs in 1 month after you give birth.

## 2020-02-06 ENCOUNTER — Encounter: Payer: Self-pay | Admitting: Women's Health

## 2020-02-06 ENCOUNTER — Ambulatory Visit (INDEPENDENT_AMBULATORY_CARE_PROVIDER_SITE_OTHER): Payer: Medicaid Other | Admitting: Women's Health

## 2020-02-06 ENCOUNTER — Other Ambulatory Visit (HOSPITAL_COMMUNITY)
Admission: RE | Admit: 2020-02-06 | Discharge: 2020-02-06 | Disposition: A | Discharge status: Home / Self Care | Payer: Medicaid Other | Source: Ambulatory Visit | Attending: Obstetrics & Gynecology | Admitting: Obstetrics & Gynecology

## 2020-02-06 ENCOUNTER — Other Ambulatory Visit: Payer: Self-pay

## 2020-02-06 VITALS — BP 108/73 | HR 97 | Wt 172.2 lb

## 2020-02-06 DIAGNOSIS — Z113 Encounter for screening for infections with a predominantly sexual mode of transmission: Secondary | ICD-10-CM | POA: Insufficient documentation

## 2020-02-06 DIAGNOSIS — Z3A36 36 weeks gestation of pregnancy: Secondary | ICD-10-CM | POA: Insufficient documentation

## 2020-02-06 DIAGNOSIS — Z23 Encounter for immunization: Secondary | ICD-10-CM

## 2020-02-06 DIAGNOSIS — O2343 Unspecified infection of urinary tract in pregnancy, third trimester: Secondary | ICD-10-CM

## 2020-02-06 DIAGNOSIS — O0993 Supervision of high risk pregnancy, unspecified, third trimester: Secondary | ICD-10-CM | POA: Insufficient documentation

## 2020-02-06 DIAGNOSIS — O0990 Supervision of high risk pregnancy, unspecified, unspecified trimester: Secondary | ICD-10-CM

## 2020-02-06 DIAGNOSIS — O99323 Drug use complicating pregnancy, third trimester: Secondary | ICD-10-CM

## 2020-02-06 DIAGNOSIS — F1120 Opioid dependence, uncomplicated: Secondary | ICD-10-CM

## 2020-02-06 DIAGNOSIS — B182 Chronic viral hepatitis C: Secondary | ICD-10-CM

## 2020-02-06 DIAGNOSIS — Z331 Pregnant state, incidental: Secondary | ICD-10-CM

## 2020-02-06 DIAGNOSIS — E0590 Thyrotoxicosis, unspecified without thyrotoxic crisis or storm: Secondary | ICD-10-CM | POA: Insufficient documentation

## 2020-02-06 DIAGNOSIS — Z1389 Encounter for screening for other disorder: Secondary | ICD-10-CM

## 2020-02-06 LAB — POCT URINALYSIS DIPSTICK OB
Blood, UA: NEGATIVE
Glucose, UA: NEGATIVE
Leukocytes, UA: NEGATIVE
Nitrite, UA: NEGATIVE

## 2020-02-06 NOTE — Progress Notes (Signed)
HIGH-RISK PREGNANCY VISIT Patient name: Dawn Davies MRN [id number]  Date of birth: 10/09/1991 Chief Complaint:   Routine Prenatal Visit  History of Present Illness:   Dawn Davies is a 29 y.o. G67P1001 female at [redacted]w[redacted]d with an Estimated Date of Delivery: 03/04/20 being seen today for ongoing management of a high-risk pregnancy complicated by chronic HepC, subutex maintenance, Xanax use, thyrotoxicosis w/ thyroid nodules, recurrent UTIs on suppression, h/o pre-e w/ pp subarachnoid hemorrhage/stroke, h/o seizures.  Today she reports no complaints. Contractions: Not present. Vag. Bleeding: None.  Movement: Present. denies leaking of fluid.  Review of Systems:   Pertinent items are noted in HPI Denies abnormal vaginal discharge w/ itching/odor/irritation, headaches, visual changes, shortness of breath, chest pain, abdominal pain, severe nausea/vomiting, or problems with urination or bowel movements unless otherwise stated above. Pertinent History Reviewed:  Reviewed past medical,surgical, social, obstetrical and family history.  Reviewed problem list, medications and allergies. Physical Assessment:   Vitals:   02/06/20 1005  BP: 108/73  Pulse: 97  Weight: 172 lb 3.2 oz (78.1 kg)  Body mass index is 26.18 kg/m.           Physical Examination:   General appearance: alert, well appearing, and in no distress  Mental status: alert, oriented to person, place, and time  Skin: warm & dry   Extremities: Edema: Trace    Cardiovascular: normal heart rate noted  Respiratory: normal respiratory effort, no distress  Abdomen: gravid, soft, non-tender  Pelvic: Cervical exam performed  Dilation: 1.5 Effacement (%): 50 Station: -2  Fetal Status: Fetal Heart Rate (bpm): 140 Fundal Height: 34 cm Movement: Present Presentation: Vertex  Fetal Surveillance Testing today: doppler   Chaperone: Rolena Infante    Results for orders placed or performed in visit on 02/06/20 (from the past 24 hour(s))  POC  Urinalysis Dipstick OB   Collection Time: 02/06/20 10:02 AM  Result Value Ref Range   Color, UA     Clarity, UA     Glucose, UA Negative Negative   Bilirubin, UA     Ketones, UA moderate    Spec Grav, UA     Blood, UA n    pH, UA     POC,PROTEIN,UA Trace Negative, Trace, Small (1+), Moderate (2+), Large (3+), 4+   Urobilinogen, UA     Nitrite, UA n    Leukocytes, UA Negative Negative   Appearance     Odor      Assessment & Plan:  1) High-risk pregnancy G2P1001 at [redacted]w[redacted]d with an Estimated Date of Delivery: 03/04/20   2) Chronic HepC, stable, check LFTs today, f/u GI pp for tx  3) Subutex maintenance, stable  4) Xanax use  5) Thyrotoxicosis w/ thyroid nodules> no meds, managed by [facility] end[facility], f/u 4wks pp  6) Recurrent UTIs on suppression> urine cx today  7) H/O pre-e w/ pp subarachnoid hemorrhage/stroke>continue ASA  8) H/O seizures> on neurontin  Meds: No orders of the defined types were placed in this encounter.   Labs/procedures today: gbs, gc/ct, sve, tdap, urine cx  Reviewed: Preterm labor symptoms and general obstetric precautions including but not limited to vaginal bleeding, contractions, leaking of fluid and fetal movement were reviewed in detail with the patient.  All questions were answered. Has home bp cuff. Check bp weekly, let us know if >140/90.   Follow-up: Return in about 1 week (around 02/13/2020) for [place], [place], [place]. Pt requests online visits  Orders Placed This Encounter  Procedures  . Strep Gp  B NAA  . Urine Culture  . Tdap vaccine greater than or equal to 7yo IM  . Comprehensive metabolic panel  . POC Urinalysis Dipstick OB   Cheral Marker CNM, [facility] 02/06/2020 12:57 PM

## 2020-02-06 NOTE — Patient Instructions (Signed)
Dawn Davies, I greatly value your feedback.  If you receive a survey following your visit with us today, we appreciate you taking the time to fill it out.  Thanks, Kim Hang Ammon, CNM, WHNP-BC  [facility] is now [facility] ([address] [place], [place] [address]) Entrance located off of [address] Free 24/7 valet parking   Go to [email or web address] to register for FREE online childbirth classes    Call the office ([phone]) or go to [facility] if:  You begin to have strong, frequent contractions  Your water breaks.  Sometimes it is a big gush of fluid, sometimes it is just a trickle that keeps getting your panties wet or running down your legs  You have vaginal bleeding.  It is normal to have a small amount of spotting if your cervix was checked.   You don't feel your baby moving like normal.  If you don't, get you something to eat and drink and lay down and focus on feeling your baby move.  You should feel at least 10 movements in 2 hours.  If you don't, you should call the office or go to [facility].   Home Blood Pressure Monitoring for Patients   Your provider has recommended that you check your blood pressure (BP) at least once a week at home. If you do not have a blood pressure cuff at home, one will be provided for you. Contact your provider if you have not received your monitor within 1 week.   Helpful Tips for Accurate Home Blood Pressure Checks  . Don't smoke, exercise, or drink caffeine 30 minutes before checking your BP . Use the restroom before checking your BP (a full bladder can raise your pressure) . Relax in a comfortable upright chair . Feet on the ground . Left arm resting comfortably on a flat surface at the level of your heart . Legs uncrossed . Back supported . Sit quietly and don't talk . Place the cuff on your bare arm . Adjust snuggly, so that only two fingertips can fit between your skin and  the top of the cuff . Check 2 readings separated by at least one minute . Keep a log of your BP readings . For a visual, please reference this diagram: http://ccnc.care/bpdiagram  Provider Name: [facility]     Phone: [phone]  Zone 1: ALL CLEAR  Continue to monitor your symptoms:  . BP reading is less than 140 (top number) or less than 90 (bottom number)  . No right upper stomach pain . No headaches or seeing spots . No feeling nauseated or throwing up . No swelling in face and hands  Zone 2: CAUTION Call your doctor's office for any of the following:  . BP reading is greater than 140 (top number) or greater than 90 (bottom number)  . Stomach pain under your ribs in the middle or right side . Headaches or seeing spots . Feeling nauseated or throwing up . Swelling in face and hands  Zone 3: EMERGENCY  Seek immediate medical care if you have any of the following:  . BP reading is greater than160 (top number) or greater than 110 (bottom number) . Severe headaches not improving with Tylenol . Serious difficulty catching your breath . Any worsening symptoms from Zone 2    Braxton Hicks Contractions Contractions of the uterus can occur throughout pregnancy, but they are not always a sign that you are in   labor. You may have practice contractions called Braxton Hicks contractions. These false labor contractions are sometimes confused with true labor. What are [place] contractions? Braxton Hicks contractions are tightening movements that occur in the muscles of the uterus before labor. Unlike true labor contractions, these contractions do not result in opening (dilation) and thinning of the cervix. Toward the end of pregnancy (32-34 weeks), Braxton Hicks contractions can happen more often and may become stronger. These contractions are sometimes difficult to tell apart from true labor because they can be very uncomfortable. You should not feel embarrassed if you go to the  hospital with false labor. Sometimes, the only way to tell if you are in true labor is for your health care provider to look for changes in the cervix. The health care provider will do a physical exam and may monitor your contractions. If you are not in true labor, the exam should show that your cervix is not dilating and your water has not broken. If there are no other health problems associated with your pregnancy, it is completely safe for you to be sent home with false labor. You may continue to have Braxton Hicks contractions until you go into true labor. How to tell the difference between true labor and false labor True labor  Contractions last 30-70 seconds.  Contractions become very regular.  Discomfort is usually felt in the top of the uterus, and it spreads to the lower abdomen and low back.  Contractions do not go away with walking.  Contractions usually become more intense and increase in frequency.  The cervix dilates and gets thinner. False labor  Contractions are usually shorter and not as strong as true labor contractions.  Contractions are usually irregular.  Contractions are often felt in the front of the lower abdomen and in the groin.  Contractions may go away when you walk around or change positions while lying down.  Contractions get weaker and are shorter-lasting as time goes on.  The cervix usually does not dilate or become thin. Follow these instructions at home:   Take over-the-counter and prescription medicines only as told by your health care provider.  Keep up with your usual exercises and follow other instructions from your health care provider.  Eat and drink lightly if you think you are going into labor.  If Braxton Hicks contractions are making you uncomfortable: ? Change your position from lying down or resting to walking, or change from walking to resting. ? Sit and rest in a tub of warm water. ? Drink enough fluid to keep your urine pale  yellow. Dehydration may cause these contractions. ? Do slow and deep breathing several times an hour.  Keep all follow-up prenatal visits as told by your health care provider. This is important. Contact a health care provider if:  You have a fever.  You have continuous pain in your abdomen. Get help right away if:  Your contractions become stronger, more regular, and closer together.  You have fluid leaking or gushing from your vagina.  You pass blood-tinged mucus (bloody show).  You have bleeding from your vagina.  You have low back pain that you never had before.  You feel your baby's head pushing down and causing pelvic pressure.  Your baby is not moving inside you as much as it used to. Summary  Contractions that occur before labor are called Braxton Hicks contractions, false labor, or practice contractions.  Braxton Hicks contractions are usually shorter, weaker, farther apart, and less  regular than true labor contractions. True labor contractions usually become progressively stronger and regular, and they become more frequent.  Manage discomfort from [facility] contractions by changing position, resting in a warm bath, drinking plenty of water, or practicing deep breathing. This information is not intended to replace advice given to you by your health care provider. Make sure you discuss any questions you have with your health care provider. Document Revised: 11/05/2017 Document Reviewed: 04/08/2017 Elsevier Patient Education  2020 [employer].

## 2020-02-07 LAB — COMPREHENSIVE METABOLIC PANEL
ALT: 56 IU/L — ABNORMAL HIGH (ref 0–32)
AST: 53 IU/L — ABNORMAL HIGH (ref 0–40)
Albumin/Globulin Ratio: 1.5 (ref 1.2–2.2)
Albumin: 3.9 g/dL (ref 3.9–5.0)
Alkaline Phosphatase: 189 IU/L — ABNORMAL HIGH (ref 39–117)
BUN/Creatinine Ratio: 8 — ABNORMAL LOW (ref 9–23)
BUN: 5 mg/dL — ABNORMAL LOW (ref 6–20)
Bilirubin Total: 0.4 mg/dL (ref 0.0–1.2)
CO2: 19 mmol/L — ABNORMAL LOW (ref 20–29)
Calcium: 8.5 mg/dL — ABNORMAL LOW (ref 8.7–10.2)
Chloride: 105 mmol/L (ref 96–106)
Creatinine, Ser: 0.6 mg/dL (ref 0.57–1.00)
GFR calc Af Amer: 142 mL/min/{1.73_m2} (ref >59)
GFR calc non Af Amer: 124 mL/min/{1.73_m2} (ref >59)
Globulin, Total: 2.6 g/dL (ref 1.5–4.5)
Glucose: 72 mg/dL (ref 65–99)
Potassium: 4.4 mmol/L (ref 3.5–5.2)
Sodium: 139 mmol/L (ref 134–144)
Total Protein: 6.5 g/dL (ref 6.0–8.5)

## 2020-02-07 LAB — CERVICOVAGINAL ANCILLARY ONLY
Chlamydia: NEGATIVE
Comment: NEGATIVE
Comment: NORMAL
Neisseria Gonorrhea: NEGATIVE

## 2020-02-08 LAB — URINE CULTURE: Organism ID, Bacteria: NO GROWTH

## 2020-02-08 LAB — STREP GP B NAA: Strep Gp B NAA: NEGATIVE

## 2020-02-09 ENCOUNTER — Other Ambulatory Visit: Payer: Self-pay | Admitting: Obstetrics and Gynecology

## 2020-02-09 MED ORDER — CEPHALEXIN 500 MG PO CAPS: 500.0000 mg | ORAL_CAPSULE | Freq: Every day | ORAL | 0 refills | Status: DC

## 2020-02-09 NOTE — Progress Notes (Signed)
supressiv e urinary agent switched from macrodantin, to Keflex HS

## 2020-02-14 ENCOUNTER — Other Ambulatory Visit: Payer: Self-pay

## 2020-02-14 ENCOUNTER — Ambulatory Visit (INDEPENDENT_AMBULATORY_CARE_PROVIDER_SITE_OTHER): Payer: Medicaid Other | Admitting: Advanced Practice Midwife

## 2020-02-14 VITALS — BP 120/82 | HR 112 | Wt 178.0 lb

## 2020-02-14 DIAGNOSIS — F1120 Opioid dependence, uncomplicated: Secondary | ICD-10-CM

## 2020-02-14 DIAGNOSIS — G4089 Other seizures: Secondary | ICD-10-CM

## 2020-02-14 DIAGNOSIS — O99320 Drug use complicating pregnancy, unspecified trimester: Secondary | ICD-10-CM

## 2020-02-14 DIAGNOSIS — O99353 Diseases of the nervous system complicating pregnancy, third trimester: Secondary | ICD-10-CM

## 2020-02-14 DIAGNOSIS — Z331 Pregnant state, incidental: Secondary | ICD-10-CM

## 2020-02-14 DIAGNOSIS — O26899 Other specified pregnancy related conditions, unspecified trimester: Secondary | ICD-10-CM

## 2020-02-14 DIAGNOSIS — O2342 Unspecified infection of urinary tract in pregnancy, second trimester: Secondary | ICD-10-CM

## 2020-02-14 DIAGNOSIS — O0993 Supervision of high risk pregnancy, unspecified, third trimester: Secondary | ICD-10-CM

## 2020-02-14 DIAGNOSIS — O98413 Viral hepatitis complicating pregnancy, third trimester: Secondary | ICD-10-CM

## 2020-02-14 DIAGNOSIS — B182 Chronic viral hepatitis C: Secondary | ICD-10-CM

## 2020-02-14 DIAGNOSIS — O99323 Drug use complicating pregnancy, third trimester: Secondary | ICD-10-CM

## 2020-02-14 DIAGNOSIS — Z3A37 37 weeks gestation of pregnancy: Secondary | ICD-10-CM

## 2020-02-14 DIAGNOSIS — Z6791 Unspecified blood type, Rh negative: Secondary | ICD-10-CM

## 2020-02-14 DIAGNOSIS — O0990 Supervision of high risk pregnancy, unspecified, unspecified trimester: Secondary | ICD-10-CM

## 2020-02-14 DIAGNOSIS — O2343 Unspecified infection of urinary tract in pregnancy, third trimester: Secondary | ICD-10-CM

## 2020-02-14 DIAGNOSIS — Z1389 Encounter for screening for other disorder: Secondary | ICD-10-CM

## 2020-02-14 LAB — POCT URINALYSIS DIPSTICK OB
Blood, UA: NEGATIVE
Glucose, UA: NEGATIVE
Ketones, UA: NEGATIVE
Leukocytes, UA: NEGATIVE
Nitrite, UA: NEGATIVE
POC,PROTEIN,UA: NEGATIVE

## 2020-02-14 MED ORDER — NITROFURANTOIN MACROCRYSTAL 100 MG PO CAPS: 100.0000 mg | ORAL_CAPSULE | Freq: Every day | ORAL | 1 refills | Status: DC

## 2020-02-14 NOTE — Progress Notes (Signed)
   HIGH-RISK PREGNANCY VISIT Patient name: Dawn Davies MRN [id number]  Date of birth: 01-29-1991 Chief Complaint:   Routine Prenatal Visit  History of Present Illness:   Dawn Davies is a 29 y.o. G23P1001 female at [redacted]w[redacted]d with an Estimated Date of Delivery: 03/04/20 being seen today for ongoing management of a high-risk pregnancy complicated by chronic Hep C, subutex maintenance, Xanax use, thyrotoxicosis w/ thyroid nodules, recurrent UTIs on suppression, hx pre-e w/ pp subarachnoid hemorrhage/stroke, hx seizures.  Today she reports no complaints. Contractions: Not present. Vag. Bleeding: None.  Movement: Present. denies leaking of fluid.  Review of Systems:   Pertinent items are noted in HPI Denies abnormal vaginal discharge w/ itching/odor/irritation, headaches, visual changes, shortness of breath, chest pain, abdominal pain, severe nausea/vomiting, or problems with urination or bowel movements unless otherwise stated above. Pertinent History Reviewed:  Reviewed past medical,surgical, social, obstetrical and family history.  Reviewed problem list, medications and allergies. Physical Assessment:   Vitals:   02/14/20 1419  BP: 120/82  Pulse: (!) 112  Weight: 178 lb (80.7 kg)  Body mass index is 27.06 kg/m.           Physical Examination:   General appearance: alert, well appearing, and in no distress  Mental status: alert, oriented to person, place, and time  Skin: warm & dry   Extremities: Edema: Trace    Cardiovascular: normal heart rate noted  Respiratory: normal respiratory effort, no distress  Abdomen: gravid, soft, non-tender  Pelvic: Cervical exam performed  Dilation: 1.5 Effacement (%): 50 Station: -3  Fetal Status: Fetal Heart Rate (bpm): 148 Fundal Height: 36 cm Movement: Present Presentation: Vertex    Results for orders placed or performed in visit on 02/14/20 (from the past 24 hour(s))  POC Urinalysis Dipstick OB   Collection Time: 02/14/20  2:25 PM  Result Value  Ref Range   Color, UA     Clarity, UA     Glucose, UA Negative Negative   Bilirubin, UA     Ketones, UA neg    Spec Grav, UA     Blood, UA neg    pH, UA     POC,PROTEIN,UA Negative Negative, Trace, Small (1+), Moderate (2+), Large (3+), 4+   Urobilinogen, UA     Nitrite, UA neg    Leukocytes, UA Negative Negative   Appearance     Odor      Assessment & Plan:  1) High-risk pregnancy G2P1001 at [redacted]w[redacted]d with an Estimated Date of Delivery: 03/04/20   2) Chronic Hep C, LFTs stable/decreasing; plan for ID f/u pp  3) Subutex maintenance, stable  4) Xanax use  5) Recurrent UTIs on suppression, neg culture 02/06/20; refill given on Macrodantin  6) Hx pre-e w/ PP subarachnoid hemorrhage/stroke, continue BASA  7) Hx seizures, on Neurontin  Meds: No orders of the defined types were placed in this encounter.   Labs/procedures today: SVE  Treatment Plan:  Await SOL; plan for colpo PP  Reviewed: Term labor symptoms and general obstetric precautions including but not limited to vaginal bleeding, contractions, leaking of fluid and fetal movement were reviewed in detail with the patient.  All questions were answered. Has home bp cuff. Check bp weekly, let us know if >140/90.   Follow-up: Return in about 1 week (around 02/21/2020) for HROB, in person.  Orders Placed This Encounter  Procedures  . POC Urinalysis Dipstick OB   Dawn Davies [facility] 02/14/2020 2:47 PM

## 2020-02-14 NOTE — Patient Instructions (Signed)
Braxton Hicks Contractions °Contractions of the uterus can occur throughout pregnancy, but they are not always a sign that you are in labor. You may have practice contractions called Braxton Hicks contractions. These false labor contractions are sometimes confused with true labor. °What are Braxton Hicks contractions? °Braxton Hicks contractions are tightening movements that occur in the muscles of the uterus before labor. Unlike true labor contractions, these contractions do not result in opening (dilation) and thinning of the cervix. Toward the end of pregnancy (32-34 weeks), Braxton Hicks contractions can happen more often and may become stronger. These contractions are sometimes difficult to tell apart from true labor because they can be very uncomfortable. You should not feel embarrassed if you go to the hospital with false labor. °Sometimes, the only way to tell if you are in true labor is for your health care provider to look for changes in the cervix. The health care provider will do a physical exam and may monitor your contractions. If you are not in true labor, the exam should show that your cervix is not dilating and your water has not broken. °If there are no other health problems associated with your pregnancy, it is completely safe for you to be sent home with false labor. You may continue to have Braxton Hicks contractions until you go into true labor. °How to tell the difference between true labor and false labor °True labor °· Contractions last 30-70 seconds. °· Contractions become very regular. °· Discomfort is usually felt in the top of the uterus, and it spreads to the lower abdomen and low back. °· Contractions do not go away with walking. °· Contractions usually become more intense and increase in frequency. °· The cervix dilates and gets thinner. °False labor °· Contractions are usually shorter and not as strong as true labor contractions. °· Contractions are usually irregular. °· Contractions  are often felt in the front of the lower abdomen and in the groin. °· Contractions may go away when you walk around or change positions while lying down. °· Contractions get weaker and are shorter-lasting as time goes on. °· The cervix usually does not dilate or become thin. °Follow these instructions at home: ° °· Take over-the-counter and prescription medicines only as told by your health care provider. °· Keep up with your usual exercises and follow other instructions from your health care provider. °· Eat and drink lightly if you think you are going into labor. °· If Braxton Hicks contractions are making you uncomfortable: °? Change your position from lying down or resting to walking, or change from walking to resting. °? Sit and rest in a tub of warm water. °? Drink enough fluid to keep your urine pale yellow. Dehydration may cause these contractions. °? Do slow and deep breathing several times an hour. °· Keep all follow-up prenatal visits as told by your health care provider. This is important. °Contact a health care provider if: °· You have a fever. °· You have continuous pain in your abdomen. °Get help right away if: °· Your contractions become stronger, more regular, and closer together. °· You have fluid leaking or gushing from your vagina. °· You pass blood-tinged mucus (bloody show). °· You have bleeding from your vagina. °· You have low back pain that you never had before. °· You feel your baby’s head pushing down and causing pelvic pressure. °· Your baby is not moving inside you as much as it used to. °Summary °· Contractions that occur before labor are   called Braxton Hicks contractions, false labor, or practice contractions. °· Braxton Hicks contractions are usually shorter, weaker, farther apart, and less regular than true labor contractions. True labor contractions usually become progressively stronger and regular, and they become more frequent. °· Manage discomfort from Braxton Hicks contractions  by changing position, resting in a warm bath, drinking plenty of water, or practicing deep breathing. °This information is not intended to replace advice given to you by your health care provider. Make sure you discuss any questions you have with your health care provider. °Document Revised: 11/05/2017 Document Reviewed: 04/08/2017 °Elsevier Patient Education © 2020 Elsevier Inc. ° °

## 2020-02-21 ENCOUNTER — Other Ambulatory Visit: Payer: Self-pay

## 2020-02-21 ENCOUNTER — Ambulatory Visit (INDEPENDENT_AMBULATORY_CARE_PROVIDER_SITE_OTHER): Payer: Medicaid Other | Admitting: Women's Health

## 2020-02-21 ENCOUNTER — Encounter: Payer: Self-pay | Admitting: Women's Health

## 2020-02-21 ENCOUNTER — Other Ambulatory Visit: Payer: Self-pay | Admitting: Advanced Practice Midwife

## 2020-02-21 VITALS — BP 108/69 | HR 100 | Wt 173.0 lb

## 2020-02-21 DIAGNOSIS — O2342 Unspecified infection of urinary tract in pregnancy, second trimester: Secondary | ICD-10-CM

## 2020-02-21 DIAGNOSIS — O26893 Other specified pregnancy related conditions, third trimester: Secondary | ICD-10-CM

## 2020-02-21 DIAGNOSIS — Z3A38 38 weeks gestation of pregnancy: Secondary | ICD-10-CM

## 2020-02-21 DIAGNOSIS — F1320 Sedative, hypnotic or anxiolytic dependence, uncomplicated: Secondary | ICD-10-CM

## 2020-02-21 DIAGNOSIS — F1120 Opioid dependence, uncomplicated: Secondary | ICD-10-CM

## 2020-02-21 DIAGNOSIS — O26843 Uterine size-date discrepancy, third trimester: Secondary | ICD-10-CM

## 2020-02-21 DIAGNOSIS — N898 Other specified noninflammatory disorders of vagina: Secondary | ICD-10-CM | POA: Diagnosis not present

## 2020-02-21 DIAGNOSIS — O0993 Supervision of high risk pregnancy, unspecified, third trimester: Secondary | ICD-10-CM

## 2020-02-21 DIAGNOSIS — B182 Chronic viral hepatitis C: Secondary | ICD-10-CM

## 2020-02-21 DIAGNOSIS — O0990 Supervision of high risk pregnancy, unspecified, unspecified trimester: Secondary | ICD-10-CM

## 2020-02-21 DIAGNOSIS — Z1389 Encounter for screening for other disorder: Secondary | ICD-10-CM

## 2020-02-21 DIAGNOSIS — O99320 Drug use complicating pregnancy, unspecified trimester: Secondary | ICD-10-CM

## 2020-02-21 DIAGNOSIS — O98413 Viral hepatitis complicating pregnancy, third trimester: Secondary | ICD-10-CM

## 2020-02-21 DIAGNOSIS — Z331 Pregnant state, incidental: Secondary | ICD-10-CM

## 2020-02-21 LAB — POCT URINALYSIS DIPSTICK OB
Blood, UA: NEGATIVE
Glucose, UA: NEGATIVE
Ketones, UA: NEGATIVE
Leukocytes, UA: NEGATIVE
Nitrite, UA: NEGATIVE
POC,PROTEIN,UA: NEGATIVE

## 2020-02-21 LAB — POCT WET PREP (WET MOUNT)
Clue Cells Wet Prep Whiff POC: POSITIVE
Trichomonas Wet Prep HPF POC: ABSENT

## 2020-02-21 MED ORDER — CEPHALEXIN 500 MG PO CAPS: 500.0000 mg | ORAL_CAPSULE | Freq: Every day | ORAL | 0 refills | Status: DC

## 2020-02-21 MED ORDER — METRONIDAZOLE 500 MG PO TABS: 500.0000 mg | ORAL_TABLET | Freq: Two times a day (BID) | ORAL | 0 refills | Status: DC

## 2020-02-21 NOTE — Progress Notes (Signed)
HIGH-RISK PREGNANCY VISIT Patient name: Dawn Davies MRN [id number]  Date of birth: 12/04/1991 Chief Complaint:   High Risk Gestation (want cervical check)  History of Present Illness:   Dawn Davies is a 29 y.o. G34P1001 female at [redacted]w[redacted]d with an Estimated Date of Delivery: 03/04/20 being seen today for ongoing management of a high-risk pregnancy complicated by chronic HepC w/ stable LFTs, subutex maintenance 8mg  1-2.5/day, Xanax use 1mg  0.5-2/day, thyrotoxicosis w/ thyroid nodules, recurrent UTIs on suppression, h/o pre-e w/ pp subarachnoid hemorrhage/stroke, h/o seizures.  Today she reports no complaints. Subutex 8mg  1-2.5/day, xanax 1mg  0.5-2/day.  Contractions: Irregular.  .  Movement: Present. denies leaking of fluid.  Review of Systems:   Pertinent items are noted in HPI Denies abnormal vaginal discharge w/ itching/odor/irritation, headaches, visual changes, shortness of breath, chest pain, abdominal pain, severe nausea/vomiting, or problems with urination or bowel movements unless otherwise stated above. Pertinent History Reviewed:  Reviewed past medical,surgical, social, obstetrical and family history.  Reviewed problem list, medications and allergies. Physical Assessment:   Vitals:   02/21/20 1103  BP: 108/69  Pulse: 100  Weight: 173 lb (78.5 kg)  Body mass index is 26.3 kg/m.           Physical Examination:   General appearance: alert, well appearing, and in no distress  Mental status: alert, oriented to person, place, and time  Skin: warm & dry   Extremities: Edema: None    Cardiovascular: normal heart rate noted  Respiratory: normal respiratory effort, no distress  Abdomen: gravid, soft, non-tender  Pelvic: Cervical exam performed  Dilation: 3 Effacement (%): 50 Station: -2, large amt thin malodorous d/c noted, wet prep done  Fetal Status: Fetal Heart Rate (bpm): 140 Fundal Height: 30 cm Movement: Present Presentation: Vertex  Fetal Surveillance Testing today: doppler     Chaperone:    Results for orders placed or performed in visit on 02/21/20 (from the past 24 hour(s))  POC Urinalysis Dipstick OB   Collection Time: 02/21/20 11:09 AM  Result Value Ref Range   Color, UA     Clarity, UA     Glucose, UA Negative Negative   Bilirubin, UA     Ketones, UA neg    Spec Grav, UA     Blood, UA neg    pH, UA     POC,PROTEIN,UA Negative Negative, Trace, Small (1+), Moderate (2+), Large (3+), 4+   Urobilinogen, UA     Nitrite, UA neg    Leukocytes, UA Negative Negative   Appearance     Odor    POCT Wet Prep 02/23/20 [place])   Collection Time: 02/21/20  1:22 PM  Result Value Ref Range   Source Wet Prep POC vaginal    WBC, Wet Prep HPF POC few    Bacteria Wet Prep HPF POC Few Few   BACTERIA WET PREP MORPHOLOGY POC     Clue Cells Wet Prep HPF POC Many (A) None   Clue Cells Wet Prep Whiff POC Positive Whiff    Yeast Wet Prep HPF POC None None   KOH Wet Prep POC     Trichomonas Wet Prep HPF POC Absent Absent    Assessment & Plan:  1) High-risk pregnancy G2P1001 at [redacted]w[redacted]d with an Estimated Date of Delivery: 03/04/20   2) Chronic HepC, stable w/ stable LFTs, refer to GI pp for tx  3) Subutex maintenance, stable, 8mg  1-2.5/day, gave NAS/NICU consult info  4) Xanax use> 1mg  0.5-2/day  5) Thyrotoxicosis w/ thyroid  nodules  6)  Recurrent UTIs> stop macrobid, rx keflex d/t term  7) H/O pre-e w/ pp subarachnoid hemorrhage/stroke, h/o seizures.  8) BV> Rx metronidazole 500mg  BID x 7d for BV  9) Uterine size <dates- get EFW/AFI u/s  Meds:  Meds ordered this encounter  Medications  . cephALEXin (KEFLEX) 500 MG capsule    Sig: Take 1 capsule (500 mg total) by mouth at bedtime.    Dispense:  30 capsule    Refill:  0    Order Specific Question:   Supervising Provider    Answer:   Elonda Husky, LUTHER H [[id number]]  . metroNIDAZOLE (FLAGYL) 500 MG tablet    Sig: Take 1 tablet (500 mg total) by mouth 2 (two) times daily.    Dispense:  14 tablet     Refill:  0    Order Specific Question:   Supervising Provider    Answer:   Tania Ade H [[id number]]    Labs/procedures today: sve, wet prep  Reviewed: Term labor symptoms and general obstetric precautions including but not limited to vaginal bleeding, contractions, leaking of fluid and fetal movement were reviewed in detail with the patient.  All questions were answered.   Follow-up: Return for ASAP, US:EFW (no visit), then 1wk HROB in person w/ CNM.  Orders Placed This Encounter  Procedures  . US OB Follow Up  . POC Urinalysis Dipstick OB  . POCT Wet Prep [facility])   Roma Schanz CNM, [facility] 02/21/2020 1:25 PM

## 2020-02-21 NOTE — Patient Instructions (Addendum)
Dawn Davies, I greatly value your feedback.  If you receive a survey following your visit with [name] today, we appreciate you taking the time to fill it out.  Thanks, Dawn Davies, CNM, [facility]  [facility] HAS MOVED!!! It is now [facility] at [facility] ([address] [place], [place] [address]) Entrance located off of [address] Free 24/7 valet parking   STOP THE MACROBID (MACRODANTIN), START KEFLEX (CEPHALEXIN) NIGHTLY   Go to [email or web address] to register for FREE online childbirth classes   NICU tour/consult with neonatal nurse-practitioner [phone]    Call the office [phone]) or go to [facility] if:  You begin to have strong, frequent contractions  Your water breaks.  Sometimes it is a big gush of fluid, sometimes it is just a trickle that keeps getting your panties wet or running down your legs  You have vaginal bleeding.  It is normal to have a small amount of spotting if your cervix was checked.   You don't feel your baby moving like normal.  If you don't, get you something to eat and drink and lay down and focus on feeling your baby move.  You should feel at least 10 movements in 2 hours.  If you don't, you should call the office or go to [facility].   Home Blood Pressure Monitoring for Patients   Your provider has recommended that you check your blood pressure (BP) at least once a week at home. If you do not have a blood pressure cuff at home, one will be provided for you. Contact your provider if you have not received your monitor within 1 week.   Helpful Tips for Accurate Home Blood Pressure Checks  . Don't smoke, exercise, or drink caffeine 30 minutes before checking your BP . Use the restroom before checking your BP (a full bladder can raise your pressure) . Relax in a comfortable upright chair . Feet on the ground . Left arm resting comfortably on a flat surface at the level of your heart . Legs uncrossed . Back  supported . Sit quietly and don't talk . Place the cuff on your bare arm . Adjust snuggly, so that only two fingertips can fit between your skin and the top of the cuff . Check 2 readings separated by at least one minute . Keep a log of your BP readings . For a visual, please reference this diagram: http://ccnc.care/bpdiagram  Provider Name: [facility]     Phone: [phone]  Zone 1: ALL CLEAR  Continue to monitor your symptoms:  . BP reading is less than 140 (top number) or less than 90 (bottom number)  . No right upper stomach pain . No headaches or seeing spots . No feeling nauseated or throwing up . No swelling in face and hands  Zone 2: CAUTION Call your doctor's office for any of the following:  . BP reading is greater than 140 (top number) or greater than 90 (bottom number)  . Stomach pain under your ribs in the middle or right side . Headaches or seeing spots . Feeling nauseated or throwing up . Swelling in face and hands  Zone 3: EMERGENCY  Seek immediate medical care if you have any of the following:  . BP reading is greater than160 (top number) or greater than 110 (bottom number) . Severe headaches not improving with Tylenol . Serious difficulty catching your breath . Any worsening symptoms from Zone 2    Braxton Hicks Contractions Contractions  of the uterus can occur throughout pregnancy, but they are not always a sign that you are in labor. You may have practice contractions called Braxton Hicks contractions. These false labor contractions are sometimes confused with true labor. What are Dawn Davies contractions? Braxton Hicks contractions are tightening movements that occur in the muscles of the uterus before labor. Unlike true labor contractions, these contractions do not result in opening (dilation) and thinning of the cervix. Toward the end of pregnancy (32-34 weeks), Braxton Hicks contractions can happen more often and may become stronger. These  contractions are sometimes difficult to tell apart from true labor because they can be very uncomfortable. You should not feel embarrassed if you go to the hospital with false labor. Sometimes, the only way to tell if you are in true labor is for your health care provider to look for changes in the cervix. The health care provider will do a physical exam and may monitor your contractions. If you are not in true labor, the exam should show that your cervix is not dilating and your water has not broken. If there are no other health problems associated with your pregnancy, it is completely safe for you to be sent home with false labor. You may continue to have Braxton Hicks contractions until you go into true labor. How to tell the difference between true labor and false labor True labor  Contractions last 30-70 seconds.  Contractions become very regular.  Discomfort is usually felt in the top of the uterus, and it spreads to the lower abdomen and low back.  Contractions do not go away with walking.  Contractions usually become more intense and increase in frequency.  The cervix dilates and gets thinner. False labor  Contractions are usually shorter and not as strong as true labor contractions.  Contractions are usually irregular.  Contractions are often felt in the front of the lower abdomen and in the groin.  Contractions may go away when you walk around or change positions while lying down.  Contractions get weaker and are shorter-lasting as time goes on.  The cervix usually does not dilate or become thin. Follow these instructions at home:   Take over-the-counter and prescription medicines only as told by your health care provider.  Keep up with your usual exercises and follow other instructions from your health care provider.  Eat and drink lightly if you think you are going into labor.  If Braxton Hicks contractions are making you uncomfortable: ? Change your position from  lying down or resting to walking, or change from walking to resting. ? Sit and rest in a tub of warm water. ? Drink enough fluid to keep your urine pale yellow. Dehydration may cause these contractions. ? Do slow and deep breathing several times an hour.  Keep all follow-up prenatal visits as told by your health care provider. This is important. Contact a health care provider if:  You have a fever.  You have continuous pain in your abdomen. Get help right away if:  Your contractions become stronger, more regular, and closer together.  You have fluid leaking or gushing from your vagina.  You pass blood-tinged mucus (bloody show).  You have bleeding from your vagina.  You have low back pain that you never had before.  You feel your baby's head pushing down and causing pelvic pressure.  Your baby is not moving inside you as much as it used to. Summary  Contractions that occur before labor are called Dawn Davies  contractions, false labor, or practice contractions.  Braxton Hicks contractions are usually shorter, weaker, farther apart, and less regular than true labor contractions. True labor contractions usually become progressively stronger and regular, and they become more frequent.  Manage discomfort from [facility] contractions by changing position, resting in a warm bath, drinking plenty of water, or practicing deep breathing. This information is not intended to replace advice given to you by your health care provider. Make sure you discuss any questions you have with your health care provider. Document Revised: 11/05/2017 Document Reviewed: 04/08/2017 Elsevier Patient Education  2020 [employer].

## 2020-02-22 ENCOUNTER — Ambulatory Visit (INDEPENDENT_AMBULATORY_CARE_PROVIDER_SITE_OTHER): Payer: Medicaid Other

## 2020-02-22 ENCOUNTER — Other Ambulatory Visit: Payer: Self-pay

## 2020-02-22 DIAGNOSIS — B1920 Unspecified viral hepatitis C without hepatic coma: Secondary | ICD-10-CM

## 2020-02-22 DIAGNOSIS — F17200 Nicotine dependence, unspecified, uncomplicated: Secondary | ICD-10-CM

## 2020-02-22 DIAGNOSIS — O26843 Uterine size-date discrepancy, third trimester: Secondary | ICD-10-CM | POA: Diagnosis not present

## 2020-02-22 DIAGNOSIS — O99323 Drug use complicating pregnancy, third trimester: Secondary | ICD-10-CM | POA: Diagnosis not present

## 2020-02-22 DIAGNOSIS — O98413 Viral hepatitis complicating pregnancy, third trimester: Secondary | ICD-10-CM

## 2020-02-22 DIAGNOSIS — Z3A38 38 weeks gestation of pregnancy: Secondary | ICD-10-CM

## 2020-02-22 DIAGNOSIS — O0990 Supervision of high risk pregnancy, unspecified, unspecified trimester: Secondary | ICD-10-CM

## 2020-02-22 DIAGNOSIS — O99333 Smoking (tobacco) complicating pregnancy, third trimester: Secondary | ICD-10-CM

## 2020-02-22 DIAGNOSIS — O0993 Supervision of high risk pregnancy, unspecified, third trimester: Secondary | ICD-10-CM

## 2020-02-22 DIAGNOSIS — F1190 Opioid use, unspecified, uncomplicated: Secondary | ICD-10-CM

## 2020-02-22 NOTE — Progress Notes (Signed)
[name] 38+3 wks,cephalic,posterior placenta gr 3,afi 11.5 cm,fhr 126 bpm,EFW 3003g 24%

## 2020-02-27 ENCOUNTER — Other Ambulatory Visit: Payer: Self-pay

## 2020-02-27 ENCOUNTER — Encounter: Payer: Self-pay | Admitting: Women's Health

## 2020-02-27 ENCOUNTER — Ambulatory Visit (INDEPENDENT_AMBULATORY_CARE_PROVIDER_SITE_OTHER): Payer: Medicaid Other | Admitting: Women's Health

## 2020-02-27 VITALS — BP 116/72 | HR 97 | Wt 175.0 lb

## 2020-02-27 DIAGNOSIS — F1120 Opioid dependence, uncomplicated: Secondary | ICD-10-CM

## 2020-02-27 DIAGNOSIS — F1320 Sedative, hypnotic or anxiolytic dependence, uncomplicated: Secondary | ICD-10-CM

## 2020-02-27 DIAGNOSIS — B182 Chronic viral hepatitis C: Secondary | ICD-10-CM

## 2020-02-27 DIAGNOSIS — O99320 Drug use complicating pregnancy, unspecified trimester: Secondary | ICD-10-CM

## 2020-02-27 DIAGNOSIS — Z1389 Encounter for screening for other disorder: Secondary | ICD-10-CM

## 2020-02-27 DIAGNOSIS — Z3A39 39 weeks gestation of pregnancy: Secondary | ICD-10-CM

## 2020-02-27 DIAGNOSIS — O0993 Supervision of high risk pregnancy, unspecified, third trimester: Secondary | ICD-10-CM

## 2020-02-27 DIAGNOSIS — Z331 Pregnant state, incidental: Secondary | ICD-10-CM

## 2020-02-27 DIAGNOSIS — O98413 Viral hepatitis complicating pregnancy, third trimester: Secondary | ICD-10-CM

## 2020-02-27 LAB — POCT URINALYSIS DIPSTICK OB
Blood, UA: NEGATIVE
Glucose, UA: NEGATIVE
Ketones, UA: NEGATIVE
Leukocytes, UA: NEGATIVE
Nitrite, UA: NEGATIVE
POC,PROTEIN,UA: NEGATIVE

## 2020-02-27 NOTE — Progress Notes (Signed)
HIGH-RISK PREGNANCY VISIT Patient name: Dawn Davies MRN [id number]  Date of birth: 11/17/1991 Chief Complaint:   Routine Prenatal Visit  History of Present Illness:   Dawn Davies is a 29 y.o. G55P1001 female at [redacted]w[redacted]d with an Estimated Date of Delivery: 03/04/20 being seen today for ongoing management of a high-risk pregnancy complicated by chronic HepC w/ stable LFTs, subutex maintenance 8mg  1-2.5/day, Xanax 1mg  0.5-2/day, thyrotoxicosis w/ thyroid nodules, recurrent UTIs on supression, h/o pre-e w/ pp subarachnoid hemorrhage/stroke, h/o seizures, uterine size <dates w/ normal efw/afi.  Today she reports irregular uc's. Contractions: Irregular. Vag. Bleeding: None.  Movement: Present. denies leaking of fluid.  Review of Systems:   Pertinent items are noted in HPI Denies abnormal vaginal discharge w/ itching/odor/irritation, headaches, visual changes, shortness of breath, chest pain, abdominal pain, severe nausea/vomiting, or problems with urination or bowel movements unless otherwise stated above. Pertinent History Reviewed:  Reviewed past medical,surgical, social, obstetrical and family history.  Reviewed problem list, medications and allergies. Physical Assessment:   Vitals:   02/27/20 0913  BP: 116/72  Pulse: 97  Weight: 175 lb (79.4 kg)  Body mass index is 26.61 kg/m.           Physical Examination:   General appearance: alert, well appearing, and in no distress  Mental status: alert, oriented to person, place, and time  Skin: warm & dry   Extremities: Edema: None    Cardiovascular: normal heart rate noted  Respiratory: normal respiratory effort, no distress  Abdomen: gravid, soft, non-tender  Pelvic: Cervical exam performed  Dilation: 3 Effacement (%): 50 Station: -2 Offered membrane sweeping, discussed r/b- pt decided to proceed, so membranes swept.   Fetal Status: Fetal Heart Rate (bpm): 145 Fundal Height: 28 cm Movement: Present Presentation: Vertex  Fetal Surveillance  Testing today: doppler   Chaperone: Amanda Rash    Results for orders placed or performed in visit on 02/27/20 (from the past 24 hour(s))  POC Urinalysis Dipstick OB   Collection Time: 02/27/20  9:15 AM  Result Value Ref Range   Color, UA     Clarity, UA     Glucose, UA Negative Negative   Bilirubin, UA     Ketones, UA neg    Spec Grav, UA     Blood, UA neg    pH, UA     POC,PROTEIN,UA Negative Negative, Trace, Small (1+), Moderate (2+), Large (3+), 4+   Urobilinogen, UA     Nitrite, UA neg    Leukocytes, UA Negative Negative   Appearance     Odor      Assessment & Plan:  1) High-risk pregnancy G2P1001 at [redacted]w[redacted]d with an Estimated Date of Delivery: 03/04/20   2) Chronic HepC, stable w/ stable LFTs, refer to GI pp for tx  3) Subutex maintenance, stable, 8mg  1-2.5/day  4) Xanax use> 1mg  0.5-2/day  5) Thyrotoxicosis w/ thyroid nodules  6) Recurrent UTIs> on keflex  7) H/O pre-e w/ pp subarachnoid hemorrhage/stroke  8) H/O seizures  9) Uterine size <dates- EFW 24%/AFI 11.5cm 3/18 @ 38.3wks  Meds: No orders of the defined types were placed in this encounter.   Labs/procedures today: sve, membrane sweeping  Reviewed: Term labor symptoms and general obstetric precautions including but not limited to vaginal bleeding, contractions, leaking of fluid and fetal movement were reviewed in detail with the patient.  All questions were answered.   Follow-up: Return in about 1 week (around 03/05/2020) for [place], MD or CNM.  Orders Placed This Encounter  Procedures  . POC Urinalysis Dipstick OB   Cheral Marker CNM, [facility] 02/27/2020 9:42 AM

## 2020-02-27 NOTE — Patient Instructions (Signed)
Dawn Davies, I greatly value your feedback.  If you receive a survey following your visit with [name] today, we appreciate you taking the time to fill it out.  Thanks, Knute Neu, CNM, [facility]  [place]!!! It is now [place] at [facility] ([place], [place] [address]) Entrance located off of [address] parking   Go to [email or web address] to register for FREE online childbirth classes    Call the office [phone]) or go to [facility] if:  You begin to have strong, frequent contractions  Your water breaks.  Sometimes it is a big gush of fluid, sometimes it is just a trickle that keeps getting your panties wet or running down your legs  You have vaginal bleeding.  It is normal to have a small amount of spotting if your cervix was checked.   You don't feel your baby moving like normal.  If you don't, get you something to eat and drink and lay down and focus on feeling your baby move.  You should feel at least 10 movements in 2 hours.  If you don't, you should call the office or go to [place] Blood Pressure Monitoring for Patients   Your provider has recommended that you check your blood pressure (BP) at least once a week at home. If you do not have a blood pressure cuff at home, one will be provided for you. Contact your provider if you have not received your monitor within 1 week.   Helpful Tips for Accurate Home Blood Pressure Checks  . Don't smoke, exercise, or drink caffeine 30 minutes before checking your BP . Use the restroom before checking your BP (a full bladder can raise your pressure) . Relax in a comfortable upright chair . Feet on the ground . Left arm resting comfortably on a flat surface at the level of your heart . Legs uncrossed . Back supported . Sit quietly and don't talk . Place the cuff on your bare arm . Adjust snuggly, so that only two fingertips can fit between your skin and  the top of the cuff . Check 2 readings separated by at least one minute . Keep a log of your BP readings . For a visual, please reference this diagram: http://ccnc.care/bpdiagram  Provider Name: [facility]     Phone: [phone]  Zone 1: ALL CLEAR  Continue to monitor your symptoms:  . BP reading is less than 140 (top number) or less than 90 (bottom number)  . No right upper stomach pain . No headaches or seeing spots . No feeling nauseated or throwing up . No swelling in face and hands  Zone 2: CAUTION Call your doctor's office for any of the following:  . BP reading is greater than 140 (top number) or greater than 90 (bottom number)  . Stomach pain under your ribs in the middle or right side . Headaches or seeing spots . Feeling nauseated or throwing up . Swelling in face and hands  Zone 3: EMERGENCY  Seek immediate medical care if you have any of the following:  . BP reading is greater than160 (top number) or greater than 110 (bottom number) . Severe headaches not improving with Tylenol . Serious difficulty catching your breath . Any worsening symptoms from Zone 2    Braxton Hicks Contractions Contractions of the uterus can occur throughout pregnancy, but they are not always a sign that you are in  labor. You may have practice contractions called Braxton Hicks contractions. These false labor contractions are sometimes confused with true labor. What are [place] contractions? Braxton Hicks contractions are tightening movements that occur in the muscles of the uterus before labor. Unlike true labor contractions, these contractions do not result in opening (dilation) and thinning of the cervix. Toward the end of pregnancy (32-34 weeks), Braxton Hicks contractions can happen more often and may become stronger. These contractions are sometimes difficult to tell apart from true labor because they can be very uncomfortable. You should not feel embarrassed if you go to the  hospital with false labor. Sometimes, the only way to tell if you are in true labor is for your health care provider to look for changes in the cervix. The health care provider will do a physical exam and may monitor your contractions. If you are not in true labor, the exam should show that your cervix is not dilating and your water has not broken. If there are no other health problems associated with your pregnancy, it is completely safe for you to be sent home with false labor. You may continue to have Braxton Hicks contractions until you go into true labor. How to tell the difference between true labor and false labor True labor  Contractions last 30-70 seconds.  Contractions become very regular.  Discomfort is usually felt in the top of the uterus, and it spreads to the lower abdomen and low back.  Contractions do not go away with walking.  Contractions usually become more intense and increase in frequency.  The cervix dilates and gets thinner. False labor  Contractions are usually shorter and not as strong as true labor contractions.  Contractions are usually irregular.  Contractions are often felt in the front of the lower abdomen and in the groin.  Contractions may go away when you walk around or change positions while lying down.  Contractions get weaker and are shorter-lasting as time goes on.  The cervix usually does not dilate or become thin. Follow these instructions at home:   Take over-the-counter and prescription medicines only as told by your health care provider.  Keep up with your usual exercises and follow other instructions from your health care provider.  Eat and drink lightly if you think you are going into labor.  If Braxton Hicks contractions are making you uncomfortable: ? Change your position from lying down or resting to walking, or change from walking to resting. ? Sit and rest in a tub of warm water. ? Drink enough fluid to keep your urine pale  yellow. Dehydration may cause these contractions. ? Do slow and deep breathing several times an hour.  Keep all follow-up prenatal visits as told by your health care provider. This is important. Contact a health care provider if:  You have a fever.  You have continuous pain in your abdomen. Get help right away if:  Your contractions become stronger, more regular, and closer together.  You have fluid leaking or gushing from your vagina.  You pass blood-tinged mucus (bloody show).  You have bleeding from your vagina.  You have low back pain that you never had before.  You feel your baby's head pushing down and causing pelvic pressure.  Your baby is not moving inside you as much as it used to. Summary  Contractions that occur before labor are called Braxton Hicks contractions, false labor, or practice contractions.  Braxton Hicks contractions are usually shorter, weaker, farther apart, and less  regular than true labor contractions. True labor contractions usually become progressively stronger and regular, and they become more frequent.  Manage discomfort from [facility] contractions by changing position, resting in a warm bath, drinking plenty of water, or practicing deep breathing. This information is not intended to replace advice given to you by your health care provider. Make sure you discuss any questions you have with your health care provider. Document Revised: 11/05/2017 Document Reviewed: 04/08/2017 Elsevier Patient Education  2020 [employer].

## 2020-02-28 ENCOUNTER — Encounter: Payer: Medicaid Other | Admitting: Advanced Practice Midwife

## 2020-02-28 ENCOUNTER — Other Ambulatory Visit: Payer: Self-pay

## 2020-02-28 ENCOUNTER — Encounter (HOSPITAL_COMMUNITY): Payer: Self-pay | Admitting: Obstetrics & Gynecology

## 2020-02-28 ENCOUNTER — Inpatient Hospital Stay (HOSPITAL_COMMUNITY)
Admission: AD | Admit: 2020-02-28 | Discharge: 2020-03-02 | DRG: 806 | Disposition: A | Discharge status: Home / Self Care | Payer: Medicaid Other | Attending: Obstetrics & Gynecology | Admitting: Obstetrics & Gynecology

## 2020-02-28 DIAGNOSIS — O360930 Maternal care for other rhesus isoimmunization, third trimester, not applicable or unspecified: Secondary | ICD-10-CM | POA: Diagnosis not present

## 2020-02-28 DIAGNOSIS — Z20822 Contact with and (suspected) exposure to covid-19: Secondary | ICD-10-CM | POA: Diagnosis present

## 2020-02-28 DIAGNOSIS — F411 Generalized anxiety disorder: Secondary | ICD-10-CM | POA: Diagnosis present

## 2020-02-28 DIAGNOSIS — O99334 Smoking (tobacco) complicating childbirth: Secondary | ICD-10-CM | POA: Diagnosis present

## 2020-02-28 DIAGNOSIS — F410 Panic disorder [episodic paroxysmal anxiety] without agoraphobia: Secondary | ICD-10-CM | POA: Diagnosis present

## 2020-02-28 DIAGNOSIS — O9842 Viral hepatitis complicating childbirth: Secondary | ICD-10-CM | POA: Diagnosis present

## 2020-02-28 DIAGNOSIS — O99344 Other mental disorders complicating childbirth: Principal | ICD-10-CM | POA: Diagnosis present

## 2020-02-28 DIAGNOSIS — Z6791 Unspecified blood type, Rh negative: Secondary | ICD-10-CM

## 2020-02-28 DIAGNOSIS — O99354 Diseases of the nervous system complicating childbirth: Secondary | ICD-10-CM | POA: Diagnosis present

## 2020-02-28 DIAGNOSIS — Z8673 Personal history of transient ischemic attack (TIA), and cerebral infarction without residual deficits: Secondary | ICD-10-CM

## 2020-02-28 DIAGNOSIS — F1320 Sedative, hypnotic or anxiolytic dependence, uncomplicated: Secondary | ICD-10-CM | POA: Diagnosis present

## 2020-02-28 DIAGNOSIS — B1920 Unspecified viral hepatitis C without hepatic coma: Secondary | ICD-10-CM | POA: Diagnosis present

## 2020-02-28 DIAGNOSIS — F17210 Nicotine dependence, cigarettes, uncomplicated: Secondary | ICD-10-CM | POA: Diagnosis present

## 2020-02-28 DIAGNOSIS — O26899 Other specified pregnancy related conditions, unspecified trimester: Secondary | ICD-10-CM

## 2020-02-28 DIAGNOSIS — B182 Chronic viral hepatitis C: Secondary | ICD-10-CM | POA: Diagnosis present

## 2020-02-28 DIAGNOSIS — Z8759 Personal history of other complications of pregnancy, childbirth and the puerperium: Secondary | ICD-10-CM

## 2020-02-28 DIAGNOSIS — O0990 Supervision of high risk pregnancy, unspecified, unspecified trimester: Secondary | ICD-10-CM

## 2020-02-28 DIAGNOSIS — F17200 Nicotine dependence, unspecified, uncomplicated: Secondary | ICD-10-CM | POA: Diagnosis present

## 2020-02-28 DIAGNOSIS — Z3A39 39 weeks gestation of pregnancy: Secondary | ICD-10-CM

## 2020-02-28 DIAGNOSIS — O99324 Drug use complicating childbirth: Secondary | ICD-10-CM | POA: Diagnosis present

## 2020-02-28 DIAGNOSIS — G40909 Epilepsy, unspecified, not intractable, without status epilepticus: Secondary | ICD-10-CM | POA: Diagnosis present

## 2020-02-28 DIAGNOSIS — O26893 Other specified pregnancy related conditions, third trimester: Secondary | ICD-10-CM | POA: Diagnosis present

## 2020-02-28 DIAGNOSIS — F331 Major depressive disorder, recurrent, moderate: Secondary | ICD-10-CM | POA: Diagnosis present

## 2020-02-28 DIAGNOSIS — I609 Nontraumatic subarachnoid hemorrhage, unspecified: Secondary | ICD-10-CM

## 2020-02-28 DIAGNOSIS — F1120 Opioid dependence, uncomplicated: Secondary | ICD-10-CM | POA: Diagnosis present

## 2020-02-28 DIAGNOSIS — I67848 Other cerebrovascular vasospasm and vasoconstriction: Secondary | ICD-10-CM | POA: Diagnosis present

## 2020-02-28 LAB — COMPREHENSIVE METABOLIC PANEL
ALT: 58 U/L — ABNORMAL HIGH (ref 0–44)
AST: 59 U/L — ABNORMAL HIGH (ref 15–41)
Albumin: 3.1 g/dL — ABNORMAL LOW (ref 3.5–5.0)
Alkaline Phosphatase: 201 U/L — ABNORMAL HIGH (ref 38–126)
Anion gap: 10 (ref 5–15)
BUN: 8 mg/dL (ref 6–20)
CO2: 20 mmol/L — ABNORMAL LOW (ref 22–32)
Calcium: 9.6 mg/dL (ref 8.9–10.3)
Chloride: 107 mmol/L (ref 98–111)
Creatinine, Ser: 0.73 mg/dL (ref 0.44–1.00)
GFR calc Af Amer: >60 mL/min (ref >60)
GFR calc non Af Amer: >60 mL/min (ref >60)
Glucose, Bld: 70 mg/dL (ref 70–99)
Potassium: 4.8 mmol/L (ref 3.5–5.1)
Sodium: 137 mmol/L (ref 135–145)
Total Bilirubin: 0.5 mg/dL (ref 0.3–1.2)
Total Protein: 6.4 g/dL — ABNORMAL LOW (ref 6.5–8.1)

## 2020-02-28 LAB — CBC
HCT: 35.8 % — ABNORMAL LOW (ref 36.0–46.0)
Hemoglobin: 11.9 g/dL — ABNORMAL LOW (ref 12.0–15.0)
MCH: 32.2 pg (ref 26.0–34.0)
MCHC: 33.2 g/dL (ref 30.0–36.0)
MCV: 97 fL (ref 80.0–100.0)
Platelets: 343 10*3/uL (ref 150–400)
RBC: 3.69 MIL/uL — ABNORMAL LOW (ref 3.87–5.11)
RDW: 13.1 % (ref 11.5–15.5)
WBC: 18.4 10*3/uL — ABNORMAL HIGH (ref 4.0–10.5)
nRBC: 0 % (ref 0.0–0.2)

## 2020-02-28 LAB — RESPIRATORY PANEL BY RT PCR (FLU A&B, COVID)
Influenza A by PCR: NEGATIVE
Influenza B by PCR: NEGATIVE
SARS Coronavirus 2 by RT PCR: NEGATIVE

## 2020-02-28 LAB — TYPE AND SCREEN
ABO/RH(D): AB NEG
Antibody Screen: POSITIVE

## 2020-02-28 MED ORDER — FENTANYL CITRATE (PF) 100 MCG/2ML IJ SOLN: 100.0000 ug | INTRAMUSCULAR | Status: DC | PRN

## 2020-02-28 MED ORDER — OXYTOCIN 40 UNITS IN NORMAL SALINE INFUSION - SIMPLE MED: 2.5000 [IU]/h | INTRAVENOUS | Status: DC

## 2020-02-28 MED ORDER — LIDOCAINE HCL (PF) 1 % IJ SOLN: 30.0000 mL | INTRAMUSCULAR | Status: DC | PRN

## 2020-02-28 MED ORDER — LACTATED RINGERS IV SOLN
500.0000 mL | INTRAVENOUS | Status: DC | PRN
  Administered 2020-02-28: 1000 mL via INTRAVENOUS

## 2020-02-28 MED ORDER — NON FORMULARY: 8.0000 mg | Freq: Every day | Status: DC

## 2020-02-28 MED ORDER — OXYTOCIN BOLUS FROM INFUSION
500.0000 mL | Freq: Once | INTRAVENOUS | Status: AC
  Administered 2020-02-29: 500 mL via INTRAVENOUS

## 2020-02-28 MED ORDER — SOD CITRATE-CITRIC ACID 500-334 MG/5ML PO SOLN: 30.0000 mL | ORAL | Status: DC | PRN

## 2020-02-28 MED ORDER — LACTATED RINGERS IV SOLN: INTRAVENOUS | Status: DC

## 2020-02-28 MED ORDER — ONDANSETRON HCL 4 MG/2ML IJ SOLN: 4.0000 mg | Freq: Four times a day (QID) | INTRAMUSCULAR | Status: DC | PRN

## 2020-02-28 MED ORDER — ALPRAZOLAM 1 MG PO TABS: 1.0000 mg | ORAL_TABLET | Freq: Three times a day (TID) | ORAL | Status: DC | PRN

## 2020-02-28 MED ORDER — GABAPENTIN 600 MG PO TABS
600.0000 mg | ORAL_TABLET | Freq: Two times a day (BID) | ORAL | Status: DC
  Administered 2020-02-29 – 2020-03-02 (×6): 600 mg via ORAL
  Filled 2020-02-28 (×11): qty 1

## 2020-02-28 NOTE — Progress Notes (Signed)
Pt up to BR

## 2020-02-28 NOTE — H&P (Addendum)
OBSTETRIC ADMISSION HISTORY AND PHYSICAL  Dawn Davies is a 29 y.o. female G2P1001 with IUP at [redacted]w[redacted]d by 7wk [name] presenting for latent labor. She reports +FMs, No LOF, no VB, no blurry vision, headaches or peripheral edema, and RUQ pain.  She plans on bottle feeding. She request Depo for birth control. She received her prenatal care at [facility]   Dating: By 7wk [name] --->  Estimated Date of Delivery: 03/04/20  Sono:    @[redacted]w[redacted]d , CWD, normal anatomy, cephalic presentation, posterior placenta, 3003g, 24% EFW   Prenatal History/Complications:  Past Medical History: Past Medical History:  Diagnosis Date  . Anxiety   . Reversible cerebrovascular vasoconstriction syndrome 2013   2 weeks postpartum    Past Surgical History: History reviewed. No pertinent surgical history.  Obstetrical History: OB History    Gravida  2   Para  1   Term  1   Preterm      AB      Living  1     SAB      TAB      Ectopic      Multiple      Live Births              Social History Social History   Socioeconomic History  . Marital status: Legally Separated    Spouse name: Not on file  . Number of children: 1  . Years of education: Not on file  . Highest education level: Not on file  Occupational History  . Not on file  Tobacco Use  . Smoking status: Current Every Day Smoker    Packs/day: 0.25    Types: Cigarettes  . Smokeless tobacco: Never Used  Substance and Sexual Activity  . Alcohol use: Not Currently  . Drug use: Not Currently  . Sexual activity: Yes    Birth control/protection: None  Other Topics Concern  . Not on file  Social History Narrative   Pt is left handed   Lives in 2 story home with her daughter, brother and both parents   10th grade education   Has 1 child   On disability   Social Determinants of Health   Financial Resource Strain:   . Difficulty of Paying Living Expenses:   Food Insecurity:   . Worried About 2014 in the Last Year:    . Programme researcher, broadcasting/film/video in the Last Year:   Transportation Needs:   . Barista (Medical):   Freight forwarder Lack of Transportation (Non-Medical):   Physical Activity:   . Days of Exercise per Week:   . Minutes of Exercise per Session:   Stress:   . Feeling of Stress :   Social Connections:   . Frequency of Communication with Friends and Family:   . Frequency of Social Gatherings with Friends and Family:   . Attends Religious Services:   . Active Member of Clubs or Organizations:   . Attends [employer] Meetings:   Banker Marital Status:     Family History: Family History  Problem Relation Age of Onset  . Diabetes Paternal Grandmother   . Hypertension Mother   . Juvenile idiopathic arthritis Daughter     Allergies: Allergies  Allergen Reactions  . Hydrocodone-Acetaminophen Other (See Comments)    Reaction not specified by patient Reaction not specified by pat   . Morphine Rash  . Naloxone Nausea And Vomiting  . Varenicline Other (See Comments)    Xyzal-Headaches  Medications Prior to Admission  Medication Sig Dispense Refill Last Dose  . ALPRAZolam (XANAX) 1 MG tablet Take 1 mg by mouth 3 (three) times daily as needed for anxiety.   02/28/2020 at 1630  . aspirin EC 81 MG tablet Take 1 tablet (81 mg total) by mouth daily. 30 tablet 11 02/28/2020 at Unknown time  . buprenorphine (SUBUTEX) 2 MG SUBL SL tablet Place 8 mg under the tongue daily. Takes 2.5 tabs daily   02/28/2020 at Unknown time  . cephALEXin (KEFLEX) 500 MG capsule Take 1 capsule (500 mg total) by mouth at bedtime. 30 capsule 0 02/28/2020 at Unknown time  . gabapentin (NEURONTIN) 600 MG tablet Take 600 mg by mouth 2 (two) times daily.    02/28/2020 at Unknown time  . metroNIDAZOLE (FLAGYL) 500 MG tablet Take 1 tablet (500 mg total) by mouth 2 (two) times daily. 14 tablet 0 02/28/2020 at Unknown time  . Prenatal 6.75-0.2 MG TABS Take by mouth.   02/28/2020 at Unknown time  . Blood Pressure Monitor MISC For  regular home bp monitoring during pregnancy 1 each 0   . nitrofurantoin (MACRODANTIN) 100 MG capsule Take 1 capsule (100 mg total) by mouth at bedtime. Begin at completion of septra 30 capsule 1      Review of Systems   All systems reviewed and negative except as stated in HPI  Blood pressure 109/69, pulse 84, temperature 98.2 F (36.8 C), resp. rate 18, height 5\' 8"  (1.727 m), weight 79.4 kg, last menstrual period 05/19/2019. General appearance: alert, cooperative and no distress Lungs: clear to auscultation bilaterally Heart: regular rate and rhythm Abdomen: soft, non-tender; bowel sounds normal Extremities: Homans sign is negative, no sign of DVT Presentation: cephalic Fetal monitoringBaseline: 140 bpm, Variability: Good {> 6 bpm), Accelerations: Reactive and Decelerations: Variable: mild Uterine activityFrequency: Every 2-4 minutes Dilation: 3.5 Effacement (%): 70 Station: -2 Exam by:: KimShaw CNM   Prenatal labs: ABO, Rh: AB/Negative/-- (09/11 1102) Antibody: Negative (01/05 0925) Rubella: 13.70 (09/11 1102) RPR: Non Reactive (01/05 0925)  HBsAg: Negative (09/11 1102)  HIV: Non Reactive (01/05 0925)  GBS: --Henderson Cloud (03/02 1428) G/C:  Neg/neg 2 hr GTT normal 80/148/101 Genetic screening  Low risk NT/IT, Low risk MaterniT21 Anatomy US normal  Prenatal Transfer Tool  Maternal Diabetes: No Genetic Screening: Normal Maternal Ultrasounds/Referrals: Normal Fetal Ultrasounds or other Referrals:  None Maternal Substance Abuse:  Currently prescribed subutex and xanax Significant Maternal Medications:  Meds include: Other:  subutex, xanax Significant Maternal Lab Results: Group B Strep negative and Rh negative  No results found for this or any previous visit (from the past 24 hour(s)).  Patient Active Problem List   Diagnosis Date Noted  . Thyrotoxicosis 02/06/2020  . Pregnancy complicated by subutex maintenance, antepartum ([place]) 02/06/2020  . UTI (urinary tract  infection) during pregnancy, second trimester 11/16/2019  . Pap smear of cervix with ASCUS, cannot exclude HGSIL 09/01/2019  . Opioid dependence ([place]) 08/24/2019  . Rh negative, antepartum 08/20/2019  . History of pre-eclampsia 08/18/2019  . Supervision of high risk pregnancy, antepartum 08/18/2019  . Hepatitis C 08/18/2019  . Ovarian cyst 07/24/2019  . History of completed stroke 07/12/2019  . Smoker 07/12/2019  . Moderate episode of recurrent major depressive disorder ([place]) 03/08/2019  . Benzodiazepine dependence ([place]) 09/21/2018  . Seizure disorder ([place]) 06/22/2017  . Generalized anxiety disorder 07/23/2016  . Cerebral vasospasm 03/10/2012  . Leukocytosis 03/10/2012  . Subarachnoid hemorrhage ([place]) 03/03/2012    Assessment/Plan:  Jennea Rager is a  29 y.o. G2P1001 at [redacted]w[redacted]d here for SOL  #Labor: SOL. Patient with increasing contractions since Saturday. Had her membranes stripped at the office yesterday. Cervical exam was 3 yesterday. Currently making cervical change. Plan for expectant management. AROM/pitocin as indicated. #Pain: Per patient request. Planning on epidural #FWB: Cat 2 given variable decels. Overall reassuring with moderate variability and reactive accels #ID:  GBS neg #MOF: Bottle #MOC: Depo #Circ:  Yes, inpatient #COVID: pending #Rh Negative: received rhogam on 01/02/20. Will need rhogam eval post partum #Hx of PreE: normal prenatal BP's. Currently normal BP. CMP ordered. #Chronic Hep C: stable LFT's at previous visits. CMP ordered. No IFSE. #Opioid dependence: currently on subutex maintenance 8mg  tablets 1-2.5/day. continue home meds #Benzodiazepine dependence: currently taking xanax 1mg  0.5-2/day. Ordered xanax 1mg  TID PRN   #Thyrotoxicosis with thyroid nodules: Most recent labs were slightly low TSH 0.34 (normal low 0.35), normal free T4 and T3. on 12/07/2019 did not warrant biopsy.   #H/o seizures: currently on Gabapentin 600mg  BID. Reordered home  med #H/o of PreE with post partum subarachnoid hemorrhage/stroke: Will monitor BP closely  , MD  02/28/2020, 9:38 PM   CNM attestation:  I have seen and examined this patient; I agree with above documentation in the resident's note.   Belem Hintze is a 29 y.o. G2P1001 here for latent labor.  PE: BP 109/69   Pulse 84   Temp 98.4 F (36.9 C) (Oral)   Resp 18   Ht 5\' 8"  (1.727 m)   Wt 79.4 kg   LMP 05/19/2019 (Exact Date)   BMI 26.61 kg/m  Gen: coping well w/ ctx by breathing Resp: normal effort, no distress Abd: gravid  ROS, labs, PMH reviewed  Plan: -Admit to Labor & Delivery -Expectant management for now- encouraged to be up and moving due to back discomfort; rev'd positions that make it more likely for baby to turn -Hx of pre-eclampsia with preg prev> BPs stable, labs pending -Continue home Gabapentin, prn Xanax (up to 1mg  tid) and home Subutex -Eval later for need to augment labor with Pitocin -Anticipate vag del -Plan for pp Rhogam eval  03/01/2020 CNM 02/28/2020, 11:15 PM

## 2020-02-28 NOTE — Progress Notes (Signed)
Philipp Deputy CNM notified of pt's admission and status. Aware of ctx pattern, sve, pt's hx of brain bleed and anxiety related to delivery and meds. Aware of FHR strip that has had 1-2 15x15 accels.Will observe an hour and reck. Pt aware and agrees

## 2020-02-28 NOTE — MAU Note (Signed)
Ctxs since Sat but stronger today. Was 3 yesterday at office and membranes stripped. Strong ctxs since 1700. Denies LOF or vag bleeding

## 2020-02-28 NOTE — Progress Notes (Signed)
Philipp Deputy CNM notified of pt's sve reck. And reactive FHR. Will admit to [facility]

## 2020-02-28 NOTE — Progress Notes (Signed)
Update given to [facility] RN at pt's transfer to [facility]

## 2020-02-28 NOTE — MAU Note (Signed)
Covid swab obtained without difficulty and pt tol well. No symptoms 

## 2020-02-29 ENCOUNTER — Inpatient Hospital Stay (HOSPITAL_COMMUNITY): Payer: Medicaid Other | Admitting: Anesthesiology

## 2020-02-29 ENCOUNTER — Encounter (HOSPITAL_COMMUNITY): Payer: Self-pay | Admitting: Obstetrics & Gynecology

## 2020-02-29 DIAGNOSIS — F1120 Opioid dependence, uncomplicated: Secondary | ICD-10-CM

## 2020-02-29 DIAGNOSIS — Z3A39 39 weeks gestation of pregnancy: Secondary | ICD-10-CM

## 2020-02-29 DIAGNOSIS — O99324 Drug use complicating childbirth: Secondary | ICD-10-CM

## 2020-02-29 DIAGNOSIS — O360930 Maternal care for other rhesus isoimmunization, third trimester, not applicable or unspecified: Secondary | ICD-10-CM

## 2020-02-29 LAB — RPR: RPR Ser Ql: NONREACTIVE

## 2020-02-29 MED ORDER — BENZOCAINE-MENTHOL 20-0.5 % EX AERO
1.0000 "application " | INHALATION_SPRAY | CUTANEOUS | Status: DC | PRN
  Administered 2020-02-29: 1 via TOPICAL
  Filled 2020-02-29: qty 56

## 2020-02-29 MED ORDER — COCONUT OIL OIL: 1.0000 "application " | TOPICAL_OIL | Status: DC | PRN

## 2020-02-29 MED ORDER — SODIUM CHLORIDE (PF) 0.9 % IJ SOLN
INTRAMUSCULAR | Status: DC | PRN
  Administered 2020-02-29: 12 mL/h via EPIDURAL

## 2020-02-29 MED ORDER — LACTATED RINGERS IV SOLN: 500.0000 mL | Freq: Once | INTRAVENOUS | Status: DC

## 2020-02-29 MED ORDER — OXYTOCIN 40 UNITS IN NORMAL SALINE INFUSION - SIMPLE MED
1.0000 m[IU]/min | INTRAVENOUS | Status: DC
  Administered 2020-02-29: 2 m[IU]/min via INTRAVENOUS
  Filled 2020-02-29: qty 1000

## 2020-02-29 MED ORDER — TETANUS-DIPHTH-ACELL PERTUSSIS 5-2.5-18.5 LF-MCG/0.5 IM SUSP: 0.5000 mL | Freq: Once | INTRAMUSCULAR | Status: DC

## 2020-02-29 MED ORDER — BUPIVACAINE HCL (PF) 0.25 % IJ SOLN
INTRAMUSCULAR | Status: DC | PRN
  Administered 2020-02-29: 4 mL via EPIDURAL

## 2020-02-29 MED ORDER — BUPRENORPHINE HCL 8 MG SL SUBL
16.0000 mg | SUBLINGUAL_TABLET | Freq: Every day | SUBLINGUAL | Status: DC
  Administered 2020-02-29 – 2020-03-02 (×3): 16 mg via SUBLINGUAL
  Filled 2020-02-29 (×3): qty 2

## 2020-02-29 MED ORDER — WITCH HAZEL-GLYCERIN EX PADS: 1.0000 "application " | MEDICATED_PAD | CUTANEOUS | Status: DC | PRN

## 2020-02-29 MED ORDER — DIBUCAINE (PERIANAL) 1 % EX OINT: 1.0000 "application " | TOPICAL_OINTMENT | CUTANEOUS | Status: DC | PRN

## 2020-02-29 MED ORDER — ACETAMINOPHEN 325 MG PO TABS
650.0000 mg | ORAL_TABLET | ORAL | Status: DC | PRN
  Administered 2020-02-29: 650 mg via ORAL
  Filled 2020-02-29: qty 2

## 2020-02-29 MED ORDER — ACETAMINOPHEN 325 MG PO TABS
650.0000 mg | ORAL_TABLET | ORAL | Status: DC | PRN
  Administered 2020-02-29 – 2020-03-01 (×2): 650 mg via ORAL
  Filled 2020-02-29 (×2): qty 2

## 2020-02-29 MED ORDER — IBUPROFEN 600 MG PO TABS
600.0000 mg | ORAL_TABLET | Freq: Four times a day (QID) | ORAL | Status: DC
  Administered 2020-03-01 – 2020-03-02 (×7): 600 mg via ORAL
  Filled 2020-02-29 (×8): qty 1

## 2020-02-29 MED ORDER — DIPHENHYDRAMINE HCL 50 MG/ML IJ SOLN: 12.5000 mg | INTRAMUSCULAR | Status: DC | PRN

## 2020-02-29 MED ORDER — EPHEDRINE 5 MG/ML INJ: 10.0000 mg | INTRAVENOUS | Status: DC | PRN

## 2020-02-29 MED ORDER — PHENYLEPHRINE 40 MCG/ML (10ML) SYRINGE FOR IV PUSH (FOR BLOOD PRESSURE SUPPORT): 80.0000 ug | PREFILLED_SYRINGE | INTRAVENOUS | Status: DC | PRN

## 2020-02-29 MED ORDER — SENNOSIDES-DOCUSATE SODIUM 8.6-50 MG PO TABS
2.0000 | ORAL_TABLET | ORAL | Status: DC
  Administered 2020-03-01 (×2): 2 via ORAL
  Filled 2020-02-29 (×2): qty 2

## 2020-02-29 MED ORDER — DIPHENHYDRAMINE HCL 25 MG PO CAPS: 25.0000 mg | ORAL_CAPSULE | Freq: Four times a day (QID) | ORAL | Status: DC | PRN

## 2020-02-29 MED ORDER — PHENYLEPHRINE 40 MCG/ML (10ML) SYRINGE FOR IV PUSH (FOR BLOOD PRESSURE SUPPORT)
80.0000 ug | PREFILLED_SYRINGE | INTRAVENOUS | Status: DC | PRN
  Filled 2020-02-29: qty 10

## 2020-02-29 MED ORDER — ONDANSETRON HCL 4 MG/2ML IJ SOLN: 4.0000 mg | INTRAMUSCULAR | Status: DC | PRN

## 2020-02-29 MED ORDER — FENTANYL-BUPIVACAINE-NACL 0.5-0.125-0.9 MG/250ML-% EP SOLN
12.0000 mL/h | EPIDURAL | Status: DC | PRN
  Filled 2020-02-29: qty 250

## 2020-02-29 MED ORDER — ONDANSETRON HCL 4 MG PO TABS: 4.0000 mg | ORAL_TABLET | ORAL | Status: DC | PRN

## 2020-02-29 MED ORDER — ALPRAZOLAM 0.5 MG PO TABS
1.0000 mg | ORAL_TABLET | Freq: Three times a day (TID) | ORAL | Status: DC | PRN
  Administered 2020-02-29 – 2020-03-02 (×7): 1 mg via ORAL
  Filled 2020-02-29 (×10): qty 2

## 2020-02-29 MED ORDER — SIMETHICONE 80 MG PO CHEW: 80.0000 mg | CHEWABLE_TABLET | ORAL | Status: DC | PRN

## 2020-02-29 MED ORDER — LIDOCAINE-EPINEPHRINE (PF) 2 %-1:200000 IJ SOLN
INTRAMUSCULAR | Status: DC | PRN
  Administered 2020-02-29: 3 mL via EPIDURAL

## 2020-02-29 MED ORDER — PRENATAL MULTIVITAMIN CH
1.0000 | ORAL_TABLET | Freq: Every day | ORAL | Status: DC
  Administered 2020-03-02: 1 via ORAL
  Filled 2020-02-29 (×2): qty 1

## 2020-02-29 MED ORDER — TERBUTALINE SULFATE 1 MG/ML IJ SOLN: 0.2500 mg | Freq: Once | INTRAMUSCULAR | Status: DC | PRN

## 2020-02-29 MED ORDER — CEPHALEXIN 500 MG PO CAPS
500.0000 mg | ORAL_CAPSULE | Freq: Every day | ORAL | Status: DC
  Administered 2020-02-29: 500 mg via ORAL
  Filled 2020-02-29: qty 1

## 2020-02-29 NOTE — Anesthesia Preprocedure Evaluation (Addendum)
Anesthesia Evaluation  Patient identified by MRN, date of birth, ID band Patient awake    Reviewed: Allergy & Precautions, Patient's Chart, lab work & pertinent test results  History of Anesthesia Complications Negative for: history of anesthetic complications  Airway Mallampati: I  TM Distance: >3 FB Neck ROM: Full    Dental  (+) Dental Advisory Given, Edentulous Upper, Edentulous Lower   Pulmonary neg pulmonary ROS, neg recent URI, Current Smoker and Patient abstained from smoking.,    breath sounds clear to auscultation       Cardiovascular negative cardio ROS   Rhythm:Regular     Neuro/Psych Seizures -, Well Controlled,  PSYCHIATRIC DISORDERS Anxiety Depression Neurologists 12/2018:  history of subarachnoid hemorrhage in 2013 presenting for evaluation of seizures. Records from [place] were reviewed. In March 2013, she was 3 weeks post-partum when she developed severe headaches. She went to [facility] where [place] head showed [facility] and she was transferred to [facility]. 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 She was initially started on Verapamil then Nimodipine and had TCDs which showed increased velocities in the bilateral cerebral vasculature. She needed narcotics for headache pain control. On her follow-up visits she reported continued headaches and was started on Gabapentin and memory loss. Her last visit at their Neurology clinic was in 2014. There was a phone call to their office in April 2016 where she reported 4 seizures over the past year where she thinks she has anxiety attacks because she is aware of the events, recalling eye rolling, heavy breathing, both hands becoming numb, and chest tightness. She was told by one of her doctors it looked like a  seizure, she was asking for Valium.   She reports today that the seizures started after her Firth in 2013 and that she was having them initially every 3-4 weeks but quieted down, with last episode occurring over 6 months ago. She recalls her hands would get numb then she would lose consciousness/awareness. No tongue bite or incontinence. Dawn Davies reports she would alert him that it's coming on then blacks out with shaking lasting 20-30 seconds. Her eyes would be open and she would not respond. Sometimes she is confused after, but a lot of times she is not. She has fallen to the ground with them, no nocturnal seizures. She feels drained after with no focal weakness. She has anxiety attacks where she feels like she can't breath with tingling in both hands but would not pass out from them. She has anxiety when she gets a headaches or "scares myself into it" due to her prior history of SAH. These have calmed down with Xanax. Headaches occur mostly in the winter, she denies any headaches during summer time. She has some dizziness when standing up quickly. No diplopia. She was previously told the right side of her body is weaker when she was examined for her disability application. No falls. Dawn Davies denies any staring/unresponsive episodes separate from the shaking episodes, she denies any olfactory/gustatory hallucinations, deja vu, rising epigastric sensation, focal numbness/tingling/weakness. She has hypnic jerks.   Epilepsy Risk Factors:  History of prior SAH. Her sister in her teenage years would blackout with shaking. Otherwise she had a normal birth and early development.  There is no history of febrile convulsions, CNS infections such as meningitis/encephalitis, significant traumatic brain injury, neurosurgical procedures.  Neuromuscular disease CVA    GI/Hepatic  negative GI ROS, (+)     substance abuse  , Hepatitis -, C  Endo/Other  Hyperthyroidism -At today's visit, we reviewed her tests and I explained  that the most likely diagnosis is GTT.  It is very fortunate that her TSH normalized and she does not need medication currently.  However, the patient is disappointed that she cannot be treated for the condition, since she feels that the majority of her symptoms are related to the thyroid.  Her symptoms started before her pregnancy and, indeed, she did have a slightly low TSH in 2018, however, this normalized in 2019.  We discussed that as of now, no treatment is indicated, but we decided to check her test again in 4 weeks to see how her tests evolve.at that time, will check the TSH, fT3 and fT4 and also add thyroid stimulating antibodies - we discussed about possible modalities of treatment for the above conditions, to include PTU use for the first trimester and methimazole use afterwards or (last resort) surgery.  RAI treatment is contraindicated during the pregnancy and breast-feeding. - we may need to do thyroid ultrasound depending on the results - I would like to hold off adding beta blockers at this time.  She has only mild tachycardia, which can be related to pregnancy and also to her anxiety, especially in the setting of decreasing Xanax.  I feel that this place quite an important role in her symptoms. - no signs of Graves' ophthalmopathy: she does not have any double vision, blurry vision, eye pain, chemosis.  Renal/GU negative Renal ROS     Musculoskeletal  (+) narcotic dependent  Abdominal   Peds  Hematology  (+) Blood dyscrasia, anemia , plt 343   Anesthesia Other Findings -SAH and preE last pregnancy (2013), neg cerebral angio, no neuro follow up while pregnant, occasional frontal bitemporal HA, decreased DTR's right, no weakness noted  - Subutex and xanax for opoid dependence and anxiety  - thyrotoxicosis stable without need for medications currently  Reproductive/Obstetrics (+) Pregnancy                            Anesthesia Physical Anesthesia  Plan  ASA: III  Anesthesia Plan: Epidural   Post-op Pain Management:    Induction:   PONV Risk Score and Plan: 1 and Treatment may vary due to age or medical condition  Airway Management Planned:   Additional Equipment: Fetal Monitoring  Intra-op Plan:   Post-operative Plan:   Informed Consent: I have reviewed the patients History and Physical, chart, labs and discussed the procedure including the risks, benefits and alternatives for the proposed anesthesia with the patient or authorized representative who has indicated his/her understanding and acceptance.       Plan Discussed with:   Anesthesia Plan Comments:         Anesthesia Quick Evaluation

## 2020-02-29 NOTE — Discharge Summary (Signed)
  Postpartum Discharge Summary     Patient Name: Dawn Davies DOB: 07/26/1991 MRN: [id number]  Date of admission: 02/28/2020 Delivering Provider: WELBORN, RYAN   Date of discharge: 03/02/2020  Admitting diagnosis: Labor and delivery, indication for care [O75.9] Intrauterine pregnancy: [redacted]w[redacted]d     Secondary diagnosis:  Active Problems:   Smoker   Cerebral vasospasm   Generalized anxiety disorder   History of pre-eclampsia   Seizure disorder (HCC)   Subarachnoid hemorrhage (HCC)   Hepatitis C   Benzodiazepine dependence (HCC)   Rh negative, antepartum   Pregnancy complicated by subutex maintenance, antepartum (HCC)   Labor and delivery, indication for care  Additional problems: None     Discharge diagnosis: Term Pregnancy Delivered                                                                                                Post partum procedures:rhogam evaluation; not needed-baby A neg  Augmentation: AROM and Pitocin  Complications: None  Hospital course:  Onset of Labor With Vaginal Delivery     28 y.o. yo G2P1001 at [redacted]w[redacted]d was admitted in Latent Labor on 02/28/2020. Patient had an uncomplicated labor course as follows:  Membrane Rupture Time/Date: 9:28 AM ,02/29/2020   Intrapartum Procedures: Episiotomy: None [1]                                         Lacerations:  Labial [10]  Patient had a delivery of a Viable infant. 02/29/2020  Information for the patient's newborn:  B[place]e, Boy Danise [[id number]]       Pateint had an uncomplicated postpartum course. She was started on Celexa for depression symptoms and will f/u outpatient.  Referred to Hepatology on discharge for Hep C treatment. She is to cont PTA Subutex. Depo ordered prior to discharge. SW saw patient and did not identify barriers to discharge. She is ambulating, tolerating a regular diet, passing flatus, and urinating well. Patient is discharged home in stable condition on 03/02/20.  Delivery time: 10:12 AM     Magnesium Sulfate received: No BMZ received: No Rhophylac:No MMR:No Transfusion:No  Physical exam  Vitals:   02/29/20 1834 02/29/20 2207 03/01/20 0340 03/01/20 2220  BP: 107/80 102/75 115/82 109/72  Pulse: 65 77 69 82  Resp: 17 16    Temp: (!) 97.5 F (36.4 C) 97.8 F (36.6 C) 97.8 F (36.6 C) 98.2 F (36.8 C)  TempSrc: Oral Oral Oral Oral  SpO2:    98%  Weight:      Height:       General: alert, cooperative and no distress Lochia: appropriate Uterine Fundus: firm Incision: N/A DVT Evaluation: No evidence of DVT seen on physical exam. Labs: Lab Results  Component Value Date   WBC 14.2 (H) 03/01/2020   HGB 10.1 (L) 03/01/2020   HCT 30.2 (L) 03/01/2020   MCV 97.4 03/01/2020   PLT 245 03/01/2020   CMP Latest Ref Rng & Units 02/28/2020  Glucose 70 - 99 mg/dL 70  BUN 6 -   20 mg/dL 8  Creatinine 0.44 - 1.00 mg/dL 0.73  Sodium 135 - 145 mmol/L 137  Potassium 3.5 - 5.1 mmol/L 4.8  Chloride 98 - 111 mmol/L 107  CO2 22 - 32 mmol/L 20(L)  Calcium 8.9 - 10.3 mg/dL 9.6  Total Protein 6.5 - 8.1 g/dL 6.4(L)  Total Bilirubin 0.3 - 1.2 mg/dL 0.5  Alkaline Phos 38 - 126 U/L 201(H)  AST 15 - 41 U/L 59(H)  ALT 0 - 44 U/L 58(H)   Edinburgh Score: Edinburgh Postnatal Depression Scale Screening Tool 03/01/2020  I have been able to laugh and see the funny side of things. (No Data)    Discharge instruction: per After Visit Summary and "Baby and Me Booklet".  After visit meds:  Allergies as of 03/02/2020      Reactions   Hydrocodone-acetaminophen Other (See Comments)   Reaction not specified by patient Reaction not specified by pat   Morphine Rash   Naloxone Nausea And Vomiting   Varenicline Other (See Comments)   Xyzal-Headaches      Medication List    STOP taking these medications   aspirin EC 81 MG tablet   cephALEXin 500 MG capsule Commonly known as: KEFLEX   metroNIDAZOLE 500 MG tablet Commonly known as: FLAGYL   nitrofurantoin 100 MG capsule Commonly  known as: Macrodantin     TAKE these medications   acetaminophen 325 MG tablet Commonly known as: Tylenol Take 2 tablets (650 mg total) by mouth every 6 (six) hours as needed (for pain scale < 4).   ALPRAZolam 1 MG tablet Commonly known as: XANAX Take 1 mg by mouth 3 (three) times daily as needed for anxiety.   Blood Pressure Monitor Misc For regular home bp monitoring during pregnancy   buprenorphine 2 MG Subl SL tablet Commonly known as: SUBUTEX Place 8 mg under the tongue daily. Takes 2.5 tabs daily   citalopram 20 MG tablet Commonly known as: CELEXA Take 1 tablet (20 mg total) by mouth daily.   gabapentin 600 MG tablet Commonly known as: NEURONTIN Take 600 mg by mouth 2 (two) times daily.   ibuprofen 600 MG tablet Commonly known as: ADVIL Take 1 tablet (600 mg total) by mouth every 6 (six) hours.   Prenatal 6.75-0.2 MG Tabs Take by mouth.   senna-docusate 8.6-50 MG tablet Commonly known as: Senokot-S Take 2 tablets by mouth daily. Start taking on: March 03, 2020       Diet: routine diet  Activity: Advance as tolerated. Pelvic rest for 6 weeks.   Outpatient follow up:4 weeks Follow up Appt: Future Appointments  Date Time Provider Department Center  03/26/2020 10:30 AM [facility] None  04/04/2020  1:50 PM Cresenzo-Dishmon, Frances, CNM [facility]  06/25/2020 11:00 AM Gherghe, Cristina, MD [facility] None   Follow up Visit: Clinic message sent by S. Weinhold, CNM 02/29/2020  Please schedule this patient for Postpartum visit in: 4 weeks with the following provider: MD In-Person For C/S patients schedule nurse incision check in weeks 2 weeks: no High risk pregnancy  Delivery mode:  SVD Anticipated Birth Control:  Depo PP Procedures needed: two week mood check  Schedule Integrated BH visit: no     Newborn Data: Live born female  Birth Weight: 2985g  APGAR: 9, 9  Newborn Delivery   Birth date/time: 02/29/2020 10:12:00 Delivery  type: Vaginal, Spontaneous      Baby Feeding: Bottle Disposition:home with mother   03/02/2020 Chelsea N Fair, MD   

## 2020-02-29 NOTE — Plan of Care (Signed)
Patient took a "BC powder" aspirin.  Reviewed hospital policy about taking outside medication.  Patient agrees.

## 2020-02-29 NOTE — Anesthesia Procedure Notes (Signed)
Epidural Patient location during procedure: floor Start time: 02/29/2020 12:56 AM End time: 02/29/2020 12:26 AM  Staffing Anesthesiologist: Val Eagle, MD Performed: anesthesiologist   Preanesthetic Checklist Completed: patient identified, IV checked, risks and benefits discussed, surgical consent, monitors and equipment checked, pre-op evaluation and timeout performed  Epidural Patient position: sitting Prep: DuraPrep Patient monitoring: heart rate, continuous pulse ox and blood pressure Approach: midline Location: L4-L5 Injection technique: LOR saline  Needle:  Needle type: Tuohy  Needle gauge: 17 G Needle length: 9 cm Needle insertion depth: 7 cm Catheter type: closed end flexible Catheter size: 19 Gauge Catheter at skin depth: 12 cm Test dose: negative and 2% lidocaine with Epi 1:200 K  Additional Notes Reason for block:at surgeon's request and procedure for pain

## 2020-02-29 NOTE — Discharge Instructions (Signed)

## 2020-02-29 NOTE — Progress Notes (Signed)
LABOR PROGRESS NOTE  Dawn Davies is a 29 y.o. G2P1001 at [redacted]w[redacted]d admitted for SOL.  Subjective: Patient is getting very uncomfortable and is requesting an epidural  Objective: BP 114/72   Pulse 85   Temp 98.4 F (36.9 C) (Oral)   Resp 16   Ht 5\' 8"  (1.727 m)   Wt 79.4 kg   LMP 05/19/2019 (Exact Date)   SpO2 99%   BMI 26.61 kg/m  or  Vitals:   02/29/20 0121 02/29/20 0125 02/29/20 0130 02/29/20 0135  BP: 116/73 113/67 116/72 114/72  Pulse: 89 86 84 85  Resp: 16 16 16 16   Temp:      TempSrc:      SpO2: 100% 99% 99% 99%  Weight:      Height:         Dilation: 4.5 Effacement (%): 90 Cervical Position: Middle Station: -1 Presentation: Vertex Exam by:: K. Cowher RN FHT: baseline rate 120, moderate variability, reactive acel, absent decel Toco: q2-5 min  Labs: Lab Results  Component Value Date   WBC 18.4 (H) 02/28/2020   HGB 11.9 (L) 02/28/2020   HCT 35.8 (L) 02/28/2020   MCV 97.0 02/28/2020   PLT 343 02/28/2020    Patient Active Problem List   Diagnosis Date Noted  . Labor and delivery, indication for care 02/28/2020  . Thyrotoxicosis 02/06/2020  . Pregnancy complicated by subutex maintenance, antepartum (HCC) 02/06/2020  . UTI (urinary tract infection) during pregnancy, second trimester 11/16/2019  . Pap smear of cervix with ASCUS, cannot exclude HGSIL 09/01/2019  . Opioid dependence (HCC) 08/24/2019  . Rh negative, antepartum 08/20/2019  . History of pre-eclampsia 08/18/2019  . Supervision of high risk pregnancy, antepartum 08/18/2019  . Hepatitis C 08/18/2019  . Ovarian cyst 07/24/2019  . History of completed stroke 07/12/2019  . Smoker 07/12/2019  . Moderate episode of recurrent major depressive disorder (HCC) 03/08/2019  . Benzodiazepine dependence (HCC) 09/21/2018  . Seizure disorder (HCC) 06/22/2017  . Generalized anxiety disorder 07/23/2016  . Cerebral vasospasm 03/10/2012  . Leukocytosis 03/10/2012  . Subarachnoid hemorrhage (HCC) 03/03/2012     Assessment / Plan: 29 y.o. G2P1001 at [redacted]w[redacted]d here for SOL.  Labor: Expectant management. If she does not continue to make change, consider cytotec as indicated. Consider AROM/pitocin as indicated. Fetal Wellbeing:  Cat 1 Pain Control:  Epidural GBS: Positive - Vancomycin Anticipated MOD:  VD Rh neg: plan for postpartum eval Hx of PreE: LFT's elevated but stable from chronic Hep C. No elevated BP's.   37, MD OB Resident 02/29/2020, 1:36 AM

## 2020-02-29 NOTE — Progress Notes (Signed)
Labor Progress Note Dawn Davies is a 29 y.o. G2P1001 at [redacted]w[redacted]d presented for latent labor. S:  No complaints. O:  BP 100/65   Pulse (!) 59   Temp (!) 97.4 F (36.3 C) (Oral)   Resp 16   Ht 5\' 8"  (1.727 m)   Wt 79.4 kg   LMP 05/19/2019 (Exact Date)   SpO2 99%   BMI 26.61 kg/m  EFM: 110/moderate/acels present/some decels  CVE: Dilation: 9 Effacement (%): 100 Cervical Position: Middle Station: 0, Plus 1 Presentation: Vertex Exam by:: S WEinhold CNM   A&P: 29 y.o. G2P1001 [redacted]w[redacted]d initially presenting for latent labor, now active. #Labor: AROM performed by S. Weinhold. Continue on pitocin #Pain: Analgesia PRN #FWB: Cat II but reassuring variability #GBS negative #Chronic Hep C with stable LFTs: Will refer to GI or ID upon DC #Rh neg: rhogam eval after delivery  [redacted]w[redacted]d, DO 9:31 AM

## 2020-02-29 NOTE — Progress Notes (Signed)
Patient ID: Ocia Simek, female   DOB: [birth year], 30 y.o.   MRN: [id number]  Mostly comfortable w/ epidural, but feeling some pressure  BP 104/69, P 80 FHR 120, +accels, occ variables Ctx q 3-5 mins, spont Cx unchanged after 1+ hrs (7/90/0 to -1)  IUP@39 .3wks Protracted active labor GBS neg Chronic Hep C with stable LFTs Rh neg  Will start Pitocin 2x2 to augment labor Anticipate vag del Pharmacy to have home-dosing of Subutex ready for later this morning Plan Rhogam eval PP  Arabella Merles [facility] 02/29/2020

## 2020-03-01 LAB — CBC
HCT: 30.2 % — ABNORMAL LOW (ref 36.0–46.0)
Hemoglobin: 10.1 g/dL — ABNORMAL LOW (ref 12.0–15.0)
MCH: 32.6 pg (ref 26.0–34.0)
MCHC: 33.4 g/dL (ref 30.0–36.0)
MCV: 97.4 fL (ref 80.0–100.0)
Platelets: 245 10*3/uL (ref 150–400)
RBC: 3.1 MIL/uL — ABNORMAL LOW (ref 3.87–5.11)
RDW: 13.2 % (ref 11.5–15.5)
WBC: 14.2 10*3/uL — ABNORMAL HIGH (ref 4.0–10.5)
nRBC: 0 % (ref 0.0–0.2)

## 2020-03-01 MED ORDER — CITALOPRAM HYDROBROMIDE 20 MG PO TABS
20.0000 mg | ORAL_TABLET | Freq: Every day | ORAL | Status: DC
  Administered 2020-03-01: 20 mg via ORAL
  Filled 2020-03-01: qty 1

## 2020-03-01 NOTE — Anesthesia Postprocedure Evaluation (Signed)
Anesthesia Post Note  Patient: Dawn Davies  Procedure(s) Performed: AN AD HOC LABOR EPIDURAL     Patient location during evaluation: Mother Baby Anesthesia Type: Epidural Level of consciousness: awake and alert Pain management: pain level controlled Vital Signs Assessment: post-procedure vital signs reviewed and stable Respiratory status: spontaneous breathing, nonlabored ventilation and respiratory function stable Cardiovascular status: stable Postop Assessment: no headache, no backache, epidural receding, no apparent nausea or vomiting, patient able to bend at knees, adequate PO intake and able to ambulate Anesthetic complications: no    Last Vitals:  Vitals:   02/29/20 2207 03/01/20 0340  BP: 102/75 115/82  Pulse: 77 69  Resp: 16   Temp: 36.6 C 36.6 C  SpO2:      Last Pain:  Vitals:   03/01/20 0340  TempSrc: Oral  PainSc:    Pain Goal: Patients Stated Pain Goal: 0 (02/28/20 1947)                 Laban Emperor

## 2020-03-01 NOTE — Clinical Social Work Maternal (Signed)
CLINICAL SOCIAL WORK MATERNAL/CHILD NOTE  Patient Details  Name: Dawn Davies MRN: [id number] Date of Birth: [birth year]  Date:  03/01/2020  Clinical Social Worker Initiating Note:  Elijio Miles Date/Time: Initiated:  03/01/20/1036     Child's Name:  Dawn Davies   Biological Parents:  Mother, Father(Dawn Davies DOB: 09/19/1986)   Need for Interpreter:  None   Reason for Referral:  Behavioral Health Concerns, Current Substance Use/Substance Use During Pregnancy     Address:  [address] [place] [address]    Phone number:  [phone] (home) [phone] (MGM's number)  [phone] (FOB's number)  Additional phone number:  Household Members/Support Persons (HM/SP):   Household Member/Support Person 1, Household Member/Support Person 2, Household Member/Support Person 3, Household Member/Support Person 4, Household Member/Support Person 5   HM/SP Name Relationship DOB or Age  HM/SP -1 Addison [place] Daughter 02/08/2012  HM/SP -[place] Mom    HM/SP -3   Dad    HM/SP -4   Brother    HM/SP -5        HM/SP -6        HM/SP -7        HM/SP -8          Natural Supports (not living in the home):  Immediate Family, Spouse/significant other   Professional Supports: Other (Comment)([facility] (subutex))   Employment: Unemployed   Type of Work:     Education:  9 to 11 years   Homebound arranged:    Museum/gallery curator Resources:  Medicaid   Other Resources:  Physicist, medical  , [place] Considerations Which May Impact Care:    Strengths:  Ability to meet basic needs  , Home prepared for child  , Lexicographer chosen, Psychotropic Medications   Psychotropic Medications:  Subutex, Xanax      Pediatrician:    [employer]  Pediatrician List:   [place]      Pediatrician Fax Number:     Risk Factors/Current Problems:  Mental Health Concerns  , Substance Use     Cognitive State:  Able to Concentrate  , Alert  , Linear Thinking     Mood/Affect:  Calm  , Comfortable  , Interested  , Relaxed     CSW Assessment:  CSW received consult for history of anxiety. During chart review, it was noted [name] currently taking Subutex. CSW met with [name] to offer support and complete assessment.    [name] sitting in bed holding infant with FOB present at bedside, when CSW entered the room. CSW introduced self and received verbal permission from [name] to have FOB step out of the room so that CSW could meet with [name] in private. FOB understanding and left voluntarily. FOB very pleasant prior to leaving the room. CSW explained reason for consult to which [name] expressed understanding. [name] pleasant and engaged throughout assessment. [name] reported she currently lives with her dad, her mom, her daughter and her brother but is hopeful to move in with FOB soon. [name] confirmed she receives both [facility] and food stamps and is aware she needs to update them both of her delivery. CSW inquired about [name]'s mental health history to which [name] acknowledged having depression since she was a teenager. [name] reported currently taking Xanax to help with her anxiety symptoms and feels it is effective. Per  [name], she currently receives her Xanax through her Family Doctor at [place]. [name] stated she intends to follow up with a psychiatrist. CSW offered to provide [name] with resources but [name] stated her Family Doctor intends to make referral. [name] interested in being started on an antidepressant prior to discharge and reported success with Celexa in the past. CSW received verbal permission to reach out to [facility] to inform them of her interest. CSW denied previous PPD/A with other child. CSW provided education regarding the baby blues period vs. perinatal mood disorders. CSW recommended self-evaluation during the  postpartum time period using the New Mom Checklist from Postpartum Progress and encouraged [name] to contact a medical professional if symptoms are noted at any time. [name] did not appear to be displaying any acute mental health symptoms and denied any SI, HI or DV. [name] reported having good support from FOB, her mom, her dad, her brother and her sister. [name] reported having all essential items for infant once discharged and stated infant would be sleeping in a bassinet once home. CSW provided review of Sudden Infant Death Syndrome (SIDS) precautions and safe sleeping habits.  CSW inquired about [name]'s current Subutex prescription. Per [name], she was initially started on Suboxone about 8 years ago and has been on Subutex for the past 7. [name] stated she gets a monthly prescription from her Family Doctor and feels it is effective in managing her symptoms. Per [name], she had a subarachnoid hemorrhage following the delivery of her first child and was prescribed various pain medications. [name] stated she entered treatment and reported she hasn't used since 2014. CSW informed [name] of [place] and explained UDS came back positive for benzodiazepines(rx) and that CDS would continue to be monitored. CSW explained CPS report would be made, if warranted. [name] denied any questions or concerns regarding policy and denied any previous CPS involvement.   CSW to continue to monitor CDS and make CPS report, if warranted.     CSW Plan/Description:  No Further Intervention Required/No Barriers to Discharge, Sudden Infant Death Syndrome (SIDS) Education, Perinatal Mood and Anxiety Disorder (PMADs) Education, [place], CSW Will Continue to Monitor Umbilical Cord Tissue Drug Screen Results and Make Report if Foye Spurling, LCSW 03/01/2020, 12:43 PM

## 2020-03-01 NOTE — Progress Notes (Addendum)
POSTPARTUM PROGRESS NOTE  Subjective: Dawn Davies is a 28 y.o. G2P2002 s/p VD at [redacted]w[redacted]d.  She reports she doing well. No acute events overnight. She denies any problems with ambulating, voiding or po intake. Denies nausea or vomiting. Pain is moderately controlled.  Lochia is similar to menses.  Objective: Blood pressure 115/82, pulse 69, temperature 97.8 F (36.6 C), temperature source Oral, resp. rate 16, height 5' 8" (1.727 m), weight 79.4 kg, last menstrual period 05/19/2019, SpO2 100 %, unknown if currently breastfeeding.  Physical Exam:  General: alert, cooperative and no distress Chest: no respiratory distress Abdomen: soft, non-tender  Uterine Fundus: firm, appropriately tender Extremities: No calf swelling or tenderness   Recent Labs    02/28/20 2158 03/01/20 0449  HGB 11.9* 10.1*  HCT 35.8* 30.2*    Assessment/Plan: Dawn Davies is a 28 y.o. G2P2002 s/p VD at [redacted]w[redacted]d for SOL.  Routine Postpartum Care: Doing well, pain well-controlled.  -- Continue routine care, lactation support  -- Contraception: Depo -- Feeding: Bottle -- Chronic hep c: GI/ID referral on DC  Dispo: Plan for discharge tomorrow, will room in until baby discharged  Ryan Welborn, DO Resident, [facility]   I personally saw and evaluated the patient, performing the key elements of the service. I developed and verified the management plan that is described in the resident's/student's note, and I agree with the content with my edits above. VSS, HRR&R, Resp unlabored, Legs neg.  Fran Cresenzo-Dishmon, CNM 03/02/2020 4:57 AM    

## 2020-03-01 NOTE — Progress Notes (Signed)
Upon entering room there was a very strong presence of cigarette smoke. After speaking with security, she admitted she had smoked in the bathroom. Also made charge RN aware.

## 2020-03-01 NOTE — Progress Notes (Signed)
Went to speak with patient after being informed she would be interested in restarting Celexa which she was on previously. Patient states she had previous bouts of depression and could see herself feeling down again. Patient denies SI/HI. She states she previously did well on Celexa.  Plan: - Will restart Celexa 20mg /day - Message sent to FT for follow up behavorial health appt.

## 2020-03-02 MED ORDER — ACETAMINOPHEN 325 MG PO TABS: 650.0000 mg | ORAL_TABLET | Freq: Four times a day (QID) | ORAL | 0 refills | Status: DC | PRN

## 2020-03-02 MED ORDER — CITALOPRAM HYDROBROMIDE 20 MG PO TABS: 20.0000 mg | ORAL_TABLET | Freq: Every day | ORAL | 0 refills | Status: DC

## 2020-03-02 MED ORDER — MEDROXYPROGESTERONE ACETATE 150 MG/ML IM SUSP
150.0000 mg | Freq: Once | INTRAMUSCULAR | Status: DC
  Filled 2020-03-02: qty 1

## 2020-03-02 MED ORDER — IBUPROFEN 600 MG PO TABS: 600.0000 mg | ORAL_TABLET | Freq: Four times a day (QID) | ORAL | 0 refills | Status: DC

## 2020-03-02 MED ORDER — SENNOSIDES-DOCUSATE SODIUM 8.6-50 MG PO TABS: 2.0000 | ORAL_TABLET | ORAL | 0 refills | Status: DC

## 2020-03-02 NOTE — Progress Notes (Signed)
NT reported to this RN that upon entering the room morning weights, there was a very strong cigarette smell. [name] was found to be in the bathroom and after knocking and opening the door, [name] stuffed what was in her hands into her pants. NT called and reported to Charge RN and when RN went to investigate [name] was leaving unit with pain of 7/10 to get some "fresh air".   Laurianne Floresca, RN  

## 2020-03-02 NOTE — Progress Notes (Signed)
Pt returned from getting "fresh air" and is very drowsy, though easily aroused. Pt fell asleep mid-conversation and is unable to hold newborn without closing eyes, dropping head, and snoring though previously very energetic and talkative. Newborn given to FOB to feed after RN made bottle. [name] sleeping when RN left room and does not need anything at the moment.    Elvia Collum, RN 03/02/20

## 2020-03-02 NOTE — Progress Notes (Signed)
Upon return to unit from "fresh air", [name] called out for morning pain medicine and PRN Xanax. After medication had been administered, RN made up newborn bottle for feed. After handing over newborn to [name], she gor gradually more drowsy and unable to keep eyes open unless probed. RN stood at bedside to ensure newborn did not fall and that Mom and baby were safe. During questioning about nighttime sleep, [name] reported "best sleep in a long time" and that she wasn't tired. [name] also reports that the strong cigarette odor comes from chain smoking and residual leftover on her clothing and denies smoking in room again. RN waited until feed was complete and burped, re-swaddled, and laid newborn in crib safely. RN Percival Spanish reported to Advertising account executive and daytime RN Susie.   Elvia Collum, RN 03/02/20

## 2020-03-02 NOTE — Progress Notes (Signed)
Mom requested xanax 30 min ago.  I now go in room to give them to her.  She is hard to arouse and speech slurred.  But did want her xanax.    Dad was awake after arousing mom   They have only been feeding 15-20   I instructed her and dad earlier in shift they must feed baby 30-60.   They verbalized understanding but still did not feed baby correctly.  States baby gets fussy after a little and refuse to eat.  Instructed dad to call out next time so we can evaluate feeding.

## 2020-03-04 ENCOUNTER — Inpatient Hospital Stay (HOSPITAL_COMMUNITY): Admission: RE | Admit: 2020-03-04 | Payer: Medicaid Other | Source: Home / Self Care

## 2020-03-04 ENCOUNTER — Encounter: Payer: Self-pay | Admitting: *Deleted

## 2020-03-05 ENCOUNTER — Encounter: Payer: Medicaid Other | Admitting: Women's Health

## 2020-03-26 ENCOUNTER — Other Ambulatory Visit: Payer: Medicaid Other

## 2020-04-03 DIAGNOSIS — F53 Postpartum depression: Secondary | ICD-10-CM | POA: Insufficient documentation

## 2020-04-04 ENCOUNTER — Ambulatory Visit (INDEPENDENT_AMBULATORY_CARE_PROVIDER_SITE_OTHER): Payer: Medicaid Other | Admitting: Advanced Practice Midwife

## 2020-04-04 ENCOUNTER — Encounter: Payer: Self-pay | Admitting: Advanced Practice Midwife

## 2020-04-04 ENCOUNTER — Other Ambulatory Visit: Payer: Self-pay

## 2020-04-04 MED ORDER — NORELGESTROMIN-ETH ESTRADIOL 150-35 MCG/24HR TD PTWK: 1.0000 | MEDICATED_PATCH | TRANSDERMAL | 12 refills | Status: DC

## 2020-04-04 MED ORDER — IBUPROFEN 800 MG PO TABS: 800.0000 mg | ORAL_TABLET | Freq: Three times a day (TID) | ORAL | 1 refills | Status: DC | PRN

## 2020-04-04 NOTE — Progress Notes (Signed)
Dawn Davies is a 29 y.o. who presents for a postpartum visit. She is 4+ weeks postpartum following a spontaneous vaginal delivery. I have fully reviewed the prenatal and intrapartum course. The delivery was at 39 gestational weeks.  Anesthesia: epidural. Postpartum course has been uneventful.. Prior to DC from hospital, she was started on Celexa for depression symptoms, changed to zoloft 50mg  w/PCP (doesn't feel like it helped). Still c/o anxiety, sleeping only 2-3 hours a night and fatigue during day. Bloodwork by PCP was completely normal.  Was referred to Hepatology for Hep C treatment, but it's in [place] She is to cont PTA Subutex. Depo was not given prior to DC. Baby's course has been uneventful. Baby is feeding by bottle. Bleeding: no bleeding. Bowel function is normal. Bladder function is normal. Patient is sexually active. Contraception method is none. Postpartum depression screening: positive. Denies HI/SI.   Current Outpatient Medications:  .  acetaminophen (TYLENOL) 325 MG tablet, Take 2 tablets (650 mg total) by mouth every 6 (six) hours as needed (for pain scale < 4)., Disp: 30 tablet, Rfl: 0 .  ALPRAZolam (XANAX) 1 MG tablet, Take 1 mg by mouth 3 (three) times daily as needed for anxiety., Disp: , Rfl:  .  Blood Pressure Monitor MISC, For regular home bp monitoring during pregnancy, Disp: 1 each, Rfl: 0 .  buprenorphine (SUBUTEX) 2 MG SUBL SL tablet, Place 8 mg under the tongue daily. Takes 2.5 tabs daily, Disp: , Rfl:  .  ferrous sulfate 325 (65 FE) MG EC tablet, Take by mouth., Disp: , Rfl:  .  gabapentin (NEURONTIN) 600 MG tablet, Take 600 mg by mouth 3 (three) times daily. , Disp: , Rfl:  .  Prenatal 6.75-0.2 MG TABS, Take by mouth., Disp: , Rfl:  .  sertraline (ZOLOFT) 50 MG tablet, Take by mouth., Disp: , Rfl:  .  ibuprofen (ADVIL) 600 MG tablet, Take 1 tablet (600 mg total) by mouth every 6 (six) hours. (Patient not taking: Reported on 04/04/2020), Disp: 30 tablet, Rfl: 0 .   ibuprofen (ADVIL) 800 MG tablet, Take 1 tablet (800 mg total) by mouth every 8 (eight) hours as needed., Disp: 60 tablet, Rfl: 1 .  norelgestromin-ethinyl estradiol (ORTHO EVRA) 150-35 MCG/24HR transdermal patch, Place 1 patch onto the skin once a week., Disp: 3 patch, Rfl: 12 .  senna-docusate (SENOKOT-S) 8.6-50 MG tablet, Take 2 tablets by mouth daily. (Patient not taking: Reported on 04/04/2020), Disp: 30 tablet, Rfl: 0  Review of Systems   Constitutional: Negative for fever and chills Eyes: Negative for visual disturbances Respiratory: Negative for shortness of breath, dyspnea Cardiovascular: Negative for chest pain or palpitations  Gastrointestinal: Negative for vomiting, diarrhea.  + chronic constipation.  Genitourinary: Negative for dysuria and urgency Musculoskeletal: Negative for back pain, joint pain, myalgias  Neurological: Negative for dizziness and headaches    Objective:     Vitals:   04/04/20 1356  BP: (!) 87/62  Pulse: 90   General:  alert, cooperative and no distress   Breasts:  negative  Lungs: Normal respiratory effort  Heart:  regular rate and rhythm  Abdomen: Soft, nontender   Vulva: Declined exam  Vagina:   Cervix:    Corpus: Well involuted     Rectal Exam: denies hemorrhoids        Assessment:    normal postpartum exam Ongoing depression/anxiety/fatigue Hep C  Plan:   1. Contraception: start patch in 10 days (after a neg UPT) 2.  Contact 04/06/20 for hep c  tx 3.  Has appt w/therapist early May 4.  Stop Taking iron (hgb 15 at PCP) 5.  OTC meds for chronic constipation (doesn't like miralax) or as needed.

## 2020-04-04 NOTE — Patient Instructions (Addendum)
[facility] G[facility]   Sleep Hygeine

## 2020-06-25 ENCOUNTER — Ambulatory Visit: Payer: Medicaid Other | Admitting: Internal Medicine

## 2020-08-05 ENCOUNTER — Ambulatory Visit: Payer: Medicaid Other | Admitting: Internal Medicine

## 2021-01-14 DIAGNOSIS — N921 Excessive and frequent menstruation with irregular cycle: Secondary | ICD-10-CM | POA: Insufficient documentation

## 2021-01-14 DIAGNOSIS — E042 Nontoxic multinodular goiter: Secondary | ICD-10-CM | POA: Insufficient documentation

## 2021-03-19 ENCOUNTER — Other Ambulatory Visit (HOSPITAL_COMMUNITY): Payer: Self-pay | Admitting: Urgent Care

## 2021-03-19 ENCOUNTER — Encounter (HOSPITAL_COMMUNITY): Payer: Self-pay

## 2021-03-19 ENCOUNTER — Ambulatory Visit (HOSPITAL_COMMUNITY): Payer: Medicaid Other

## 2021-03-19 DIAGNOSIS — R102 Pelvic and perineal pain: Secondary | ICD-10-CM

## 2021-03-19 DIAGNOSIS — R1904 Left lower quadrant abdominal swelling, mass and lump: Secondary | ICD-10-CM

## 2021-03-19 DIAGNOSIS — N10 Acute tubulo-interstitial nephritis: Secondary | ICD-10-CM

## 2021-03-19 DIAGNOSIS — R1900 Intra-abdominal and pelvic swelling, mass and lump, unspecified site: Secondary | ICD-10-CM | POA: Insufficient documentation

## 2021-03-19 DIAGNOSIS — R7989 Other specified abnormal findings of blood chemistry: Secondary | ICD-10-CM | POA: Insufficient documentation

## 2021-03-26 ENCOUNTER — Other Ambulatory Visit: Payer: Self-pay

## 2021-03-26 ENCOUNTER — Ambulatory Visit (HOSPITAL_COMMUNITY)
Admission: RE | Admit: 2021-03-26 | Discharge: 2021-03-26 | Disposition: A | Discharge status: Home / Self Care | Payer: Medicaid Other | Source: Ambulatory Visit | Attending: Urgent Care | Admitting: Urgent Care

## 2021-03-26 DIAGNOSIS — R1904 Left lower quadrant abdominal swelling, mass and lump: Secondary | ICD-10-CM | POA: Insufficient documentation

## 2021-03-26 DIAGNOSIS — N10 Acute tubulo-interstitial nephritis: Secondary | ICD-10-CM | POA: Insufficient documentation

## 2021-03-26 DIAGNOSIS — R102 Pelvic and perineal pain: Secondary | ICD-10-CM | POA: Diagnosis present

## 2021-05-07 DIAGNOSIS — J302 Other seasonal allergic rhinitis: Secondary | ICD-10-CM | POA: Insufficient documentation

## 2021-05-22 ENCOUNTER — Encounter: Payer: Self-pay | Admitting: Urology

## 2021-05-22 ENCOUNTER — Ambulatory Visit (INDEPENDENT_AMBULATORY_CARE_PROVIDER_SITE_OTHER): Payer: Medicaid Other | Admitting: Urology

## 2021-05-22 ENCOUNTER — Other Ambulatory Visit: Payer: Self-pay

## 2021-05-22 VITALS — BP 87/62 | HR 121 | Ht 68.25 in | Wt 172.2 lb

## 2021-05-22 DIAGNOSIS — N390 Urinary tract infection, site not specified: Secondary | ICD-10-CM

## 2021-05-22 DIAGNOSIS — R1031 Right lower quadrant pain: Secondary | ICD-10-CM | POA: Diagnosis not present

## 2021-05-22 LAB — URINALYSIS, ROUTINE W REFLEX MICROSCOPIC
Bilirubin, UA: NEGATIVE
Glucose, UA: NEGATIVE
Leukocytes,UA: NEGATIVE
Nitrite, UA: NEGATIVE
Protein,UA: NEGATIVE
RBC, UA: NEGATIVE
Specific Gravity, UA: 1.015 (ref 1.005–1.030)
Urobilinogen, Ur: 0.2 mg/dL (ref 0.2–1.0)
pH, UA: 6 (ref 5.0–7.5)

## 2021-05-22 NOTE — Progress Notes (Signed)
Urological Symptom Review  Patient is experiencing the following symptoms: Hard to postpone urination Have to strain to urinate Urinary tract infection   Review of Systems  Gastrointestinal (upper)  : Negative for upper GI symptoms  Gastrointestinal (lower) : Constipation  Constitutional : Negative for symptoms  Skin: Negative for skin symptoms  Eyes: Negative for eye symptoms  Ear/Nose/Throat : Sinus problems  Hematologic/Lymphatic: Easy bruising  Cardiovascular : Chest pain  Respiratory : Shortness of breath  Endocrine: Excessive thirst  Musculoskeletal: Back pain Joint pain  Neurological: Headaches  Psychologic: Depression Anxiety

## 2021-05-22 NOTE — Progress Notes (Signed)
Subjective: 1. Recurrent UTI   2. Right lower quadrant abdominal pain      Consult requested by Dr. Delano Metz.  Dawn Davies is a 30 yo female who is sent for recurrent UTI's.  She also had a lump adjacent to the iliac crest about a month ago that was evaluated with a CT which was negative.  She has some RLQ pain as well.  The pain can be sharp and stabbing.   She had a fever to 102 about 2-3 months ago.  She was last treated for a UTI about 2 weeks ago.  She was given bactrim and then was given an antibiotic for post coital [name].  She had microhematuria on UA's.  She only voids about 2x daily.  She feels she empties and has a good stream.  She has no dysuria.   She has no history of stones or GU surgery.  She has no incontinence.  She is G2P2 NVD2.  ROS:  ROS  Allergies  Allergen Reactions   Hydrocodone-Acetaminophen Other (See Comments)    Reaction not specified by patient Reaction not specified by pat    Morphine Rash   Naloxone Nausea And Vomiting   Varenicline Other (See Comments)    Xyzal-Headaches    Past Medical History:  Diagnosis Date   Anxiety    Reversible cerebrovascular vasoconstriction syndrome 2013   2 weeks postpartum    History reviewed. No pertinent surgical history.  Social History   Socioeconomic History   Marital status: Legally Separated    Spouse name: Not on file   Number of children: 1   Years of education: Not on file   Highest education level: Not on file  Occupational History   Not on file  Tobacco Use   Smoking status: Every Day    Packs/day: 0.25    Pack years: 0.00    Types: Cigarettes   Smokeless tobacco: Never  Vaping Use   Vaping Use: Never used  Substance and Sexual Activity   Alcohol use: Not Currently   Drug use: Not Currently   Sexual activity: Yes    Birth control/protection: None  Other Topics Concern   Not on file  Social History Narrative   Pt is left handed   Lives in 2 story home with her daughter, brother and  both parents   10th grade education   Has 1 child   On disability   Social Determinants of Health   Financial Resource Strain: Not on file  Food Insecurity: Not on file  Transportation Needs: Not on file  Physical Activity: Not on file  Stress: Not on file  Social Connections: Not on file  Intimate Partner Violence: Not on file    Family History  Problem Relation Age of Onset   Diabetes Paternal Grandmother    Hypertension Mother    Juvenile idiopathic arthritis Daughter     Anti-infectives: Anti-infectives (From admission, onward)    None       Current Outpatient Medications  Medication Sig Dispense Refill   acetaminophen (TYLENOL) 325 MG tablet Take 2 tablets (650 mg total) by mouth every 6 (six) hours as needed (for pain scale < 4). 30 tablet 0   ALPRAZolam (XANAX) 1 MG tablet Take 1 mg by mouth 3 (three) times daily as needed for anxiety.     Blood Pressure Monitor MISC For regular home bp monitoring during pregnancy 1 each 0   buprenorphine (SUBUTEX) 2 MG SUBL SL tablet Place 8 mg under the tongue  daily. Takes 2.5 tabs daily     ferrous sulfate 325 (65 FE) MG EC tablet Take by mouth.     gabapentin (NEURONTIN) 600 MG tablet Take 600 mg by mouth 3 (three) times daily.      ibuprofen (ADVIL) 800 MG tablet Take 1 tablet (800 mg total) by mouth every 8 (eight) hours as needed. 60 tablet 1   senna-docusate (SENOKOT-S) 8.6-50 MG tablet Take 2 tablets by mouth daily. 30 tablet 0   sertraline (ZOLOFT) 50 MG tablet Take by mouth.     No current facility-administered medications for this visit.     Objective: Vital signs in last 24 hours: BP (!) 87/62   Pulse (!) 121   Ht 5' 8.25" (1.734 m)   Wt 172 lb 3.2 oz (78.1 kg)   BMI 25.99 kg/m   Intake/Output from previous day: No intake/output data recorded. Intake/Output this shift: @IOTHISSHIFT @   Physical Exam Vitals reviewed.  Constitutional:      Appearance: Normal appearance.  Abdominal:     General:  Abdomen is flat.     Palpations: Abdomen is soft.     Tenderness: There is no abdominal tenderness.     Hernia: No hernia is present.  Genitourinary:    Comments: Nl External genitalia. Normal urethral meatus. Mild/mod urethral hypermobility without leakage with straining. Vaginal mucosa without atrophy.  Mild white discharge. Small to moderate cystocele without rectocele. Cervix and uterus are unremarkble. Ovaries palpable bilaterally, small, firm, non-tender. Bladder without mass or tenderness.  Musculoskeletal:        General: No swelling or tenderness. Normal range of motion.  Skin:    General: Skin is warm and dry.  Neurological:     General: No focal deficit present.     Mental Status: She is alert and oriented to person, place, and time.  Psychiatric:        Mood and Affect: Mood normal.        Behavior: Behavior normal.    Lab Results:  Results for orders placed or performed in visit on 05/22/21 (from the past 24 hour(s))  Urinalysis, Routine w reflex microscopic     Status: Abnormal   Collection Time: 05/22/21  2:04 PM  Result Value Ref Range   Specific Gravity, UA 1.015 1.005 - 1.030   pH, UA 6.0 5.0 - 7.5   Color, UA Yellow Yellow   Appearance Ur Hazy (A) Clear   Leukocytes,UA Negative Negative   Protein,UA Negative Negative/Trace   Glucose, UA Negative Negative   Ketones, UA Trace (A) Negative   RBC, UA Negative Negative   Bilirubin, UA Negative Negative   Urobilinogen, Ur 0.2 0.2 - 1.0 mg/dL   Nitrite, UA Negative Negative   Narrative   Performed at:  [id number] - [facility] [address], [place], [place]  [place] Lab Director: [id number], Phone:  [phone]    BMET No results for input(s): NA, K, CL, CO2, GLUCOSE, BUN, CREATININE, CALCIUM in the last 72 hours. PT/INR No results for input(s): LABPROT, INR in the last 72 hours. ABG No results for input(s): PHART, HCO3 in the last 72 hours.  Invalid input(s): PCO2, PO2 PVR is 11ml and  UA is clear.  Studies/Results:  CLINICAL DATA:  Bilateral flank pain and hematuria 3 weeks.   EXAM: CT ABDOMEN AND PELVIS WITHOUT CONTRAST   TECHNIQUE: Multidetector CT imaging of the abdomen and pelvis was performed following the standard protocol without IV contrast.   COMPARISON:  None.  FINDINGS: Lower chest: The lung bases are clear. No pleural or pericardial effusion.   Hepatobiliary: No focal liver abnormality is seen. No gallstones, gallbladder wall thickening, or biliary dilatation.   Pancreas: Unremarkable. No pancreatic ductal dilatation or surrounding inflammatory changes.   Spleen: Normal in size without focal abnormality.   Adrenals/Urinary Tract: Adrenal glands are unremarkable. Kidneys are normal, without renal calculi, focal lesion, or hydronephrosis. Bladder is unremarkable.   Stomach/Bowel: Stomach is within normal limits. The appendix is not seen but no evidence of appendicitis is present. No evidence of bowel wall thickening, distention, or inflammatory changes.   Vascular/Lymphatic: No significant vascular findings are present. No enlarged abdominal or pelvic lymph nodes.   Reproductive: Uterus and bilateral adnexa are unremarkable.   Other: None.   Musculoskeletal: Negative.   IMPRESSION: Negative for urinary tract stone.  Negative CT abdomen and pelvis.     Electronically Signed   By: Drusilla Kanner M.D.   On: 03/26/2021 17:15  Assessment/Plan: Recurrent UTI's.   She has been given a post coital antibiotic which I think is appropriate.  UA today is clear.  She will just need prn return.   RLQ pain.  I don't see a urologic cause.  She had what appeared to be some ovarian cysts, right > left, on the CT which could have been the cause of the pain.  I have recommended she see her Gyn.   Left inguinal bulge.  There is no evidence of hernia on exam or CT and that problem has resolved.    Mild prolapse with small cystocele.  No treatment  needed.   No orders of the defined types were placed in this encounter.    Orders Placed This Encounter  Procedures   Urinalysis, Routine w reflex microscopic   Insert,non-indwelling bladder catheter     Return if symptoms worsen or fail to improve.    CC: Dr. Royston Sinner Corrington.      Bjorn Pippin 05/22/2021 [phone]

## 2021-05-22 NOTE — Progress Notes (Signed)
post void residual=75  Pt is prepped for and in and out catherization. Patient was cleaned and prepped in a sterle fashion with betadine. A 14 fr catheter foley was inserted. Urine return was note 75 ml.  Performed by [facility], LPN

## 2021-06-04 ENCOUNTER — Encounter: Payer: Self-pay | Admitting: Internal Medicine

## 2021-06-04 ENCOUNTER — Ambulatory Visit: Payer: Medicaid Other | Admitting: Internal Medicine

## 2021-06-04 NOTE — Progress Notes (Deleted)
Patient ID: Dawn Davies, female   DOB: [birth year], 30 y.o.   MRN: [id number]   This visit occurred during the SARS-CoV-2 public health emergency.  Safety protocols were in place, including screening questions prior to the visit, additional usage of staff PPE, and extensive cleaning of exam room while observing appropriate contact time as indicated for disinfecting solutions.   HPI  Dawn Davies is a 30 y.o.-year-old female, initially referred by her PCP, Dr. Neta Mends, returning for follow-up for thyrotoxicosis in pregnancy and also thyroid nodules. She saw Dr. Hartford Poli on 08/23/2019 but he would like to switch to seeing a woman endocrinologist.  Last visit with me 1 year and 4 months ago.  Interim history: Patient was lost for follow-up after our virtual visit from 01/2020.  She gave birth 02/2020.  Reviewed and addended history: When I first saw her, she was [redacted] weeks pregnant.  When she saw Dr. Hartford Poli, she was in week 7.  At that time, she had mild morning sickness, but no hyperemesis gravidarum.  We checked her thyroid tests and they were all normal, including Graves' antibodies.  Working diagnosis at that time was resolved subacute thyroiditis versus gestational transient thyrotoxicosis.  We repeated her test a month later and the TSH was slightly low.  Free T4 and free T3 were normal.  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  We checked a thyroid ultrasound (12/07/2019): 3 small nodules, 2 meeting criteria for follow-up; no increased blood flow, heterogeneous appearance of the thyroid Parenchymal Echotexture: Mildly heterogenous Isthmus: 5 mm Right lobe: 6.1 x 1.9 x 2.8 cm Left lobe: 5.6 x 1.8 x 2.4 cm   Nodule # 1: Location: Right; Superior Maximum size: 1.7 cm; Other 2 dimensions: 0.9 x 1.4 cm Composition: solid/almost completely solid (2) Echogenicity: isoechoic (1) *Given size (>/= 1.5 - 2.4 cm) and appearance, a follow-up ultrasound in 1  year should be considered based on TI-RADS criteria. _________________________________________________________   Nodule # 2: Location: Left; Superior Maximum size: 1.0 cm; Other 2 dimensions: 0.6 x 0.9 cm Composition: mixed cystic and solid (1) Echogenicity: isoechoic (1) This nodule does NOT meet TI-RADS criteria for biopsy or dedicated follow-up. _________________________________________________________   Nodule # 3: Location: Left; Inferior Patient maximum size: 1.4 cm; Other 2 dimensions: 0.8 x 1.1 cm Composition: solid/almost completely solid (2) Echogenicity: isoechoic (1)  Margins: extra-thyroidal extension (3) *Given size (>/= 1 - 1.4 cm) and appearance, a follow-up ultrasound in 1 year should be considered based on TI-RADS criteria. _________________________________________________________   Minor nonspecific thyroid heterogeneity. No hypervascularity. No regional adenopathy.   IMPRESSION: 1.7 cm right superior TR 3 nodule and 1.4 cm left inferior TR 4 nodule. Both meet criteria for follow-up in 1 year. Currently no biopsy indicated.  We rechecked her thyroid tests in 12/2019 and a TSH was improved, very slightly low, with normal free thyroid hormones.  She was lost for follow-up afterwards.  I reviewed her TFTs: Lab Results  Component Value Date   TSH 0.34 (L) 12/15/2019   TSH 0.24 (L) 11/17/2019   TSH 1.280 10/18/2019   FREET4 0.79 12/15/2019   FREET4 0.77 11/17/2019   T3FREE 3.3 12/15/2019   T3FREE 2.7 11/17/2019   10/18/2019: TSH 1.28, T4 12.8 (4.5-12)  Previously:   Her antithyroid antibodies were not elevated: 10/18/2019: TRAb <1.1 Lab Results  Component Value Date   TSI <89 11/17/2019   She denies: - hoarseness - dysphagia - choking - SOB with lying down But she had  dysphagia whenever she was anxious.  At last visit, she was complaining of: -Fatigue -Weight gain- started 4 years prior: + 40 lbs -Heat intolerance with cold  extremities -Tremors -Palpitations -Chronic hair loss -Long history of insomnia and anxiety, increased approximately 2 years prior to our first appointment in 10/2019.  At last visit she was tapering off Xanax during her pregnancy and was not tolerating the taper very well: Mind racing, lack of appetite, pacing the floor.  At that time, she was convinced that the symptoms were related to her thyroid condition.  Pt does have a FH of thyroid ds.: mother with thyroid nodule and hyperthyroidism, 3 aunts also with thyroid ds. No FH of thyroid cancer. No h/o radiation tx to head or neck.  No seaweed or kelp. No recent contrast studies. No herbal supplements. No Biotin use. No recent steroids use.    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  She also has a history of seizures (last was 2019) and is on Neurontin and Xanax.  She is also on buprenorphine for opioid use disorder.  ROS: Constitutional: + see HPI Eyes: no blurry vision, no xerophthalmia ENT: no sore throat, + see HPI Cardiovascular: no CP/no SOB/+ palpitations Respiratory: no cough/no SOB/no wheezing Gastrointestinal: no N/no V/no D/no C/no acid reflux Musculoskeletal: no muscle aches/no joint aches Skin: no rashes, no hair loss Neurological: no tremors/no numbness/no tingling/no dizziness  I reviewed pt's medications, allergies, PMH, social hx, family hx, and changes were documented in the history of present illness. Otherwise, unchanged from  my initial visit note.  Past Medical History:  Diagnosis Date   Anxiety    Reversible cerebrovascular vasoconstriction syndrome 2013   2 weeks postpartum   No past surgical history on file. Social History   Socioeconomic History   Marital status: Legally Separated    Spouse name: Not on file   Number of children: 1   Years of education: Not on file   Highest education level: Not on file  Occupational History   Not on file  Tobacco Use   Smoking status: Every Day    Packs/day: 0.25    Pack years: 0.00    Types: Cigarettes   Smokeless tobacco: Never  Vaping Use   Vaping Use: Never used  Substance and Sexual Activity   Alcohol use: Not Currently   Drug use: Not Currently   Sexual activity: Yes    Birth control/protection: None  Other Topics Concern   Not on file  Social History Narrative   Pt is left handed   Lives in 2 story home with her daughter, brother and both parents   10th grade education   Has 1 child   On disability   Social Determinants of Health   Financial Resource Strain: Not on file  Food Insecurity: Not on file  Transportation Needs: Not on file  Physical Activity: Not on file  Stress: Not on file  Social  Connections: Not on file  Intimate Partner Violence: Not on file   Current Outpatient Medications on File Prior to Visit  Medication Sig Dispense Refill   acetaminophen (TYLENOL) 325 MG tablet Take 2 tablets (650 mg total) by mouth every 6 (six) hours as needed (for pain scale < 4). 30 tablet 0   ALPRAZolam (XANAX) 1 MG tablet Take 1 mg by mouth 3 (three) times daily as needed for anxiety.     Blood Pressure Monitor MISC For regular home bp monitoring during pregnancy 1 each 0   buprenorphine (SUBUTEX) 2 MG SUBL SL tablet Place 8 mg under the tongue daily. Takes 2.5 tabs daily     ferrous sulfate 325 (65 FE) MG EC tablet Take by mouth.     gabapentin (NEURONTIN) 600 MG tablet Take 600 mg by mouth 3 (three) times daily.      ibuprofen (ADVIL)  800 MG tablet Take 1 tablet (800 mg total) by mouth every 8 (eight) hours as needed. 60 tablet 1   senna-docusate (SENOKOT-S) 8.6-50 MG tablet Take 2 tablets by mouth daily. 30 tablet 0   sertraline (ZOLOFT) 50 MG tablet Take by mouth.     No current facility-administered medications on file prior to visit.   Allergies  Allergen Reactions   Hydrocodone-Acetaminophen Other (See Comments)    Reaction not specified by patient Reaction not specified by pat    Morphine Rash   Naloxone Nausea And Vomiting   Varenicline Other (See Comments)    Xyzal-Headaches   Family History  Problem Relation Age of Onset   Diabetes Paternal Grandmother    Hypertension Mother    Juvenile idiopathic arthritis Daughter    PE: There were no vitals taken for this visit. Wt Readings from Last 3 Encounters:  05/22/21 172 lb 3.2 oz (78.1 kg)  04/04/20 158 lb 9.6 oz (71.9 kg)  02/28/20 175 lb (79.4 kg)   Constitutional: overweight, in NAD Eyes: PERRLA, EOMI, no exophthalmos ENT: moist mucous membranes, no thyromegaly, no cervical lymphadenopathy Cardiovascular: RRR, No MRG Respiratory: CTA B Gastrointestinal: abdomen soft, NT, ND, BS+ Musculoskeletal: no deformities, strength intact in all 4 Skin: moist, warm, no rashes Neurological: no tremor with outstretched hands, DTR normal in all 4  ASSESSMENT: 1.  Subclinical thyrotoxicosis  2.  Thyroid nodules  PLAN:  1. Patient with fluctuating TSH levels from slightly low to normal, with normal free thyroid hormones.  She did not require treatment at last visits, since the degree of thyrotoxicosis was very mild.  Her Graves' antibodies were not elevated and the thyroid ultrasound did not show an increased thyroid blood flow, only heterogeneity.  We could not perform a thyroid uptake and scan at last visit, she was pregnant.   -Our initial differential diagnosis list included: gestational transient thyrotoxicosis, Graves' disease, thyroiditis, toxic  multinodular goiter/toxic adenoma. She did have 3 small thyroid nodules discovered during investigation for her thyrotoxicosis (please see next problem).  However, going forward, thyroiditis was ruled out as this usually resolve spontaneously in few months.  We did discuss that we can investigate these further after she gave birth.  However, she was then lost for follow-up for more than a year. -At last visit she had an array of symptoms which could have been seen in thyrotoxicosis, but in other conditions as well.  She had heat intolerance but also cold intolerance, palpitations, anxiety (chronic), insomnia, hair loss (chronic). -Due to the mild nature of her thyroid condition, no treatment was needed at that time -  Since then, *** -At today's visit, we will recheck her TFTs -RTC in 6 months  2.  Thyroid nodules -No neck compression symptoms except for dysphagia with anxiety-possibly globus sensation -Discovered incidentally during investigation for thyrotoxicosis -We will repeat her thyroid ultrasound now if her TFTs are normal today -However, if the TFTs are thyrotoxic, we will check a thyroid uptake and scan first -We did discuss that her nodules are managed differently than cold nodules we discussed that the risk of thyroid cancer is higher in cold nodules.  No orders of the defined types were placed in this encounter.  Philemon Kingdom, MD PhD [facility] Endocrinology

## 2021-06-13 ENCOUNTER — Telehealth: Payer: Self-pay | Admitting: Internal Medicine

## 2021-06-13 NOTE — Telephone Encounter (Signed)
Patient dismissed from [facility] Endocr[facility] by Carlus Pavlov, MD, effective 06/04/21. Dismissal Letter sent out by 1st class mail. [employer]

## 2021-08-04 ENCOUNTER — Ambulatory Visit: Payer: Medicaid Other

## 2021-08-20 ENCOUNTER — Inpatient Hospital Stay (HOSPITAL_COMMUNITY): Payer: Medicaid Other

## 2021-08-20 ENCOUNTER — Encounter (HOSPITAL_COMMUNITY): Payer: Self-pay | Admitting: Obstetrics and Gynecology

## 2021-08-20 ENCOUNTER — Other Ambulatory Visit: Payer: Self-pay

## 2021-08-20 ENCOUNTER — Inpatient Hospital Stay (HOSPITAL_COMMUNITY)
Admission: AD | Admit: 2021-08-20 | Discharge: 2021-08-20 | Disposition: A | Discharge status: Home / Self Care | Payer: Medicaid Other | Attending: Obstetrics and Gynecology | Admitting: Obstetrics and Gynecology

## 2021-08-20 DIAGNOSIS — F419 Anxiety disorder, unspecified: Secondary | ICD-10-CM

## 2021-08-20 DIAGNOSIS — Z79899 Other long term (current) drug therapy: Secondary | ICD-10-CM | POA: Insufficient documentation

## 2021-08-20 DIAGNOSIS — O99341 Other mental disorders complicating pregnancy, first trimester: Secondary | ICD-10-CM | POA: Diagnosis not present

## 2021-08-20 DIAGNOSIS — R109 Unspecified abdominal pain: Secondary | ICD-10-CM

## 2021-08-20 DIAGNOSIS — R42 Dizziness and giddiness: Secondary | ICD-10-CM | POA: Insufficient documentation

## 2021-08-20 DIAGNOSIS — O99331 Smoking (tobacco) complicating pregnancy, first trimester: Secondary | ICD-10-CM | POA: Insufficient documentation

## 2021-08-20 DIAGNOSIS — O99891 Other specified diseases and conditions complicating pregnancy: Secondary | ICD-10-CM | POA: Diagnosis not present

## 2021-08-20 DIAGNOSIS — F17210 Nicotine dependence, cigarettes, uncomplicated: Secondary | ICD-10-CM | POA: Insufficient documentation

## 2021-08-20 DIAGNOSIS — Z3A01 Less than 8 weeks gestation of pregnancy: Secondary | ICD-10-CM | POA: Diagnosis not present

## 2021-08-20 DIAGNOSIS — R110 Nausea: Secondary | ICD-10-CM

## 2021-08-20 DIAGNOSIS — O26899 Other specified pregnancy related conditions, unspecified trimester: Secondary | ICD-10-CM

## 2021-08-20 LAB — URINALYSIS, ROUTINE W REFLEX MICROSCOPIC
Bilirubin Urine: NEGATIVE
Glucose, UA: NEGATIVE mg/dL
Hgb urine dipstick: NEGATIVE
Ketones, ur: NEGATIVE mg/dL
Leukocytes,Ua: NEGATIVE
Nitrite: NEGATIVE
Protein, ur: NEGATIVE mg/dL
Specific Gravity, Urine: 1.018 (ref 1.005–1.030)
pH: 6 (ref 5.0–8.0)

## 2021-08-20 LAB — WET PREP, GENITAL
Clue Cells Wet Prep HPF POC: NONE SEEN
Sperm: NONE SEEN
Trich, Wet Prep: NONE SEEN
WBC, Wet Prep HPF POC: NONE SEEN
Yeast Wet Prep HPF POC: NONE SEEN

## 2021-08-20 LAB — POCT PREGNANCY, URINE: Preg Test, Ur: POSITIVE — AB

## 2021-08-20 LAB — GLUCOSE, CAPILLARY: Glucose-Capillary: 87 mg/dL (ref 70–99)

## 2021-08-20 MED ORDER — METOCLOPRAMIDE HCL 10 MG PO TABS
10.0000 mg | ORAL_TABLET | Freq: Once | ORAL | Status: AC
  Administered 2021-08-20: 10 mg via ORAL
  Filled 2021-08-20: qty 1

## 2021-08-20 NOTE — MAU Provider Note (Signed)
History     CSN: [id number]  Arrival date and time: 08/20/21 1534   Event Date/Time   First Provider Initiated Contact with Patient 08/20/21 1630      Chief Complaint  Patient presents with   Dizziness   Dawn Davies is a 30 y.o. G3P2002 at [redacted]w[redacted]d by early [name] on Sept 13, 2022.  She presents today, via EMS, for Dizziness.  Patient states she tried to "sit up an hour ago and started having dizziness and cramps."  Patient also reports that she had an anxiety attack at the time. She reports continued cramping in her lower abdominal area that is intermittent and starts in the middle then radiates to the sides.  She states that she would "just like to know if my baby is okay."  She reports having an [name] yesterday that confirmed and IUP, but the "heart rate was a little low."  Patient denies vaginal bleeding or discharge.  She endorses sexual activity in the past 3 days. Patient is agreeable to STD testing today. Patient states she usually takes Xanax 1mg  TID for her anxiety.  Patient reports this medication was left at her home in [facility] while trying to relocate to [facility].    OB History     Gravida  3   Para  2   Term  2   Preterm      AB      Living  2      SAB      IAB      Ectopic      Multiple  0   Live Births  1           Past Medical History:  Diagnosis Date   Anxiety    Reversible cerebrovascular vasoconstriction syndrome 2013   2 weeks postpartum    Past Surgical History:  Procedure Laterality Date   MULTIPLE TOOTH EXTRACTIONS      Family History  Problem Relation Age of Onset   Diabetes Paternal Grandmother    Hypertension Mother    Juvenile idiopathic arthritis Daughter     Social History   Tobacco Use   Smoking status: Every Day    Packs/day: 0.50    Types: Cigarettes   Smokeless tobacco: Never  Vaping Use   Vaping Use: Never used  Substance Use Topics   Alcohol use: Not Currently   Drug use: Not Currently     Allergies:  Allergies  Allergen Reactions   Morphine Anaphylaxis and Rash   Hydrocodone-Acetaminophen Itching and Other (See Comments)    Reaction not specified by patient Reaction not specified by pat    Naloxone Nausea And Vomiting   Varenicline Other (See Comments)    Xyzal-Headaches    Medications Prior to Admission  Medication Sig Dispense Refill Last Dose   buprenorphine (SUBUTEX) 2 MG SUBL SL tablet Place 8 mg under the tongue daily. Takes 2.5 tabs daily   08/20/2021   gabapentin (NEURONTIN) 600 MG tablet Take 600 mg by mouth 3 (three) times daily.    08/20/2021   acetaminophen (TYLENOL) 325 MG tablet Take 2 tablets (650 mg total) by mouth every 6 (six) hours as needed (for pain scale < 4). 30 tablet 0    ALPRAZolam (XANAX) 1 MG tablet Take 1 mg by mouth 3 (three) times daily as needed for anxiety.   08/17/2021   Blood Pressure Monitor MISC For regular home bp monitoring during pregnancy 1 each 0    ferrous sulfate 325 (65  FE) MG EC tablet Take by mouth.      ibuprofen (ADVIL) 800 MG tablet Take 1 tablet (800 mg total) by mouth every 8 (eight) hours as needed. 60 tablet 1    senna-docusate (SENOKOT-S) 8.6-50 MG tablet Take 2 tablets by mouth daily. 30 tablet 0    sertraline (ZOLOFT) 50 MG tablet Take by mouth.       Review of Systems  Gastrointestinal:  Positive for abdominal pain (Cramping) and constipation (BM yesterday, but reports constipation with medication-Subutex). Negative for diarrhea, nausea and vomiting.  Genitourinary:  Negative for difficulty urinating, dysuria, vaginal bleeding and vaginal discharge.  Neurological:  Positive for dizziness. Negative for light-headedness and headaches.  Physical Exam   Blood pressure 106/64, pulse 85, temperature 98.5 F (36.9 C), temperature source Oral, resp. rate 16, height 5\' 8"  (1.727 m), weight 85.3 kg, SpO2 100 %, not currently breastfeeding.  Physical Exam Constitutional:      Appearance: Normal appearance.  HENT:      Head: Normocephalic and atraumatic.  Eyes:     Conjunctiva/sclera: Conjunctivae normal.  Cardiovascular:     Rate and Rhythm: Normal rate and regular rhythm.     Heart sounds: Normal heart sounds.  Pulmonary:     Effort: Pulmonary effort is normal.     Breath sounds: Normal breath sounds.  Abdominal:     General: Bowel sounds are normal.     Palpations: Abdomen is soft.     Tenderness: There is no abdominal tenderness.  Musculoskeletal:        General: Normal range of motion.     Cervical back: Normal range of motion.  Skin:    General: Skin is warm and dry.  Neurological:     Mental Status: She is alert and oriented to person, place, and time.  Psychiatric:        Mood and Affect: Mood normal.        Behavior: Behavior normal.        Thought Content: Thought content normal.    MAU Course  Procedures Results for orders placed or performed during the hospital encounter of 08/20/21 (from the past 24 hour(s))  Urinalysis, Routine w reflex microscopic Urine, Clean Catch     Status: Abnormal   Collection Time: 08/20/21  3:43 PM  Result Value Ref Range   Color, Urine YELLOW YELLOW   APPearance CLOUDY (A) CLEAR   Specific Gravity, Urine 1.018 1.005 - 1.030   pH 6.0 5.0 - 8.0   Glucose, UA NEGATIVE NEGATIVE mg/dL   Hgb urine dipstick NEGATIVE NEGATIVE   Bilirubin Urine NEGATIVE NEGATIVE   Ketones, ur NEGATIVE NEGATIVE mg/dL   Protein, ur NEGATIVE NEGATIVE mg/dL   Nitrite NEGATIVE NEGATIVE   Leukocytes,Ua NEGATIVE NEGATIVE  Pregnancy, urine POC     Status: Abnormal   Collection Time: 08/20/21  3:44 PM  Result Value Ref Range   Preg Test, Ur POSITIVE (A) NEGATIVE  Wet prep, genital     Status: None   Collection Time: 08/20/21  4:36 PM   Specimen: Vaginal  Result Value Ref Range   Yeast Wet Prep HPF POC NONE SEEN NONE SEEN   Trich, Wet Prep NONE SEEN NONE SEEN   Clue Cells Wet Prep HPF POC NONE SEEN NONE SEEN   WBC, Wet Prep HPF POC NONE SEEN NONE SEEN   Sperm NONE  SEEN   Glucose, capillary     Status: None   Collection Time: 08/20/21  4:54 PM  Result Value Ref Range  Glucose-Capillary 87 70 - 99 mg/dL   US OB LESS THAN 14 WEEKS WITH OB TRANSVAGINAL  Result Date: 08/20/2021 CLINICAL DATA:  Cramping. Assigned gestational age of [redacted] weeks, 2 days. EXAM: OBSTETRIC <14 WK [name] AND TRANSVAGINAL OB US TECHNIQUE: Both transabdominal and transvaginal ultrasound examinations were performed for complete evaluation of the gestation as well as the maternal uterus, adnexal regions, and pelvic cul-de-sac. Transvaginal technique was performed to assess early pregnancy. COMPARISON:  CT abdomen pelvis dated March 26, 2021. FINDINGS: Intrauterine gestational sac: Single. Yolk sac:  Visualized. Embryo:  Visualized. Cardiac Activity: Visualized. Heart Rate: 109 bpm CRL:  3.9 mm   6 w   0 d                  [name] EDC: 04/15/2022 Subchorionic hemorrhage:  None visualized. Maternal uterus/adnexae: Unremarkable. Trace free fluid in the pelvis. IMPRESSION: 1. Single live intrauterine pregnancy with estimated gestational age of [redacted] weeks, 0 days. No acute abnormality. Electronically Signed   By: Obie Dredge M.D.   On: 08/20/2021 18:10    MDM Labs: UPT, UA, CBG Wet prep, GT/CT Ultrasound Assessment and Plan  30 year old, G3P2002  SIUP at 6.2 weeks Dizziness Anxiety  -Reviewed POC with patient. -Exam performed.  -Cultures to be collected by nurse -Will collect CBG to assess BS.   -Informed that provider will be not giving dose of Xanax today. -However, will perform [name] to provide reassurance and decreased anxiety regarding fetal-wellbeing.  -Patient agreeable and without questions or concerns. -Will send and reassess.  Cherre Robins 08/20/2021, 4:30 PM   Reassessment (6:17 PM)  -Instructed to follow up with primary doctor regarding anxiety medications. -Patient reports anxiety. -Offered and accepts reglan. -Given list of community providers for ob care. -Encouraged to  call primary office or return to MAU if symptoms worsen or with the onset of new symptoms. -Discharged to home in stable condition.  Cherre Robins MSN, CNM Advanced Practice Provider, [facility]

## 2021-08-20 NOTE — Discharge Instructions (Signed)
  [facility] Providers          [facility]  [address], [place], [place] [address]  [phone]  [facility]  [address], [place], [place] [address]  [phone]  [facility]  [address], [place], [place] [address]  [phone]  [facility] [place]  [address], [place], [place] [address]  [phone]  [facility]  [address]First floor), [place], [place] [address]  [phone]  [facility] [address], [place], [place] [address] [phone]  [facility]  [address], [place], [place] [address]  [phone]  [facility]  [address], [place], [place] [address]  [phone]  [facility]  [address], [place], [place] [address]  [phone]  [facility]  [address], [place], [place] [address]  [phone]  [facility]  [address], [place], [place] [address]  [phone]  [facility]  [address], [place], [place] [address]  [phone]  [facility]   [address], [place], [place] [address]  [phone]  [facility]  [address], [place], [place] [address]   [phone]  [facility]  [address], , [place] [address]  [phone]         

## 2021-08-20 NOTE — MAU Note (Signed)
Dawn Davies is a 30 y.o. at [redacted]w[redacted]d here in MAU reporting: pt arrived via EMS complaining of dizziness and cramping. Cramping has been going on intermittently for about 2 weeks. No bleeding or discharge.  [facility] and had and u/s done. States she was measuring about 6 weeks and the HR was 115.  Onset of complaint: ongoing  Pain score: 0/10  Vitals:   08/20/21 1553  BP: (!) 107/59  Pulse: 92  Resp: 16  Temp: 98.5 F (36.9 C)  SpO2: 100%     Lab orders placed from triage: UA, UPT

## 2021-08-21 LAB — GC/CHLAMYDIA PROBE AMP (~~LOC~~) NOT AT ARMC
Chlamydia: NEGATIVE
Comment: NEGATIVE
Comment: NORMAL
Neisseria Gonorrhea: NEGATIVE

## 2021-10-22 DIAGNOSIS — O0930 Supervision of pregnancy with insufficient antenatal care, unspecified trimester: Secondary | ICD-10-CM | POA: Insufficient documentation

## 2021-10-23 LAB — OB RESULTS CONSOLE RPR: RPR: NONREACTIVE

## 2021-10-23 LAB — HEPATITIS C ANTIBODY: HCV Ab: POSITIVE

## 2021-10-23 LAB — OB RESULTS CONSOLE HEPATITIS B SURFACE ANTIGEN: Hepatitis B Surface Ag: NEGATIVE

## 2021-10-23 LAB — OB RESULTS CONSOLE ANTIBODY SCREEN: Antibody Screen: NEGATIVE

## 2021-10-23 LAB — OB RESULTS CONSOLE ABO/RH: RH Type: NEGATIVE

## 2021-10-23 LAB — OB RESULTS CONSOLE HIV ANTIBODY (ROUTINE TESTING): HIV: NONREACTIVE

## 2021-10-23 LAB — OB RESULTS CONSOLE RUBELLA ANTIBODY, IGM: Rubella: IMMUNE

## 2021-10-23 LAB — OB RESULTS CONSOLE VARICELLA ZOSTER ANTIBODY, IGG: Varicella: IMMUNE

## 2021-11-19 DIAGNOSIS — F419 Anxiety disorder, unspecified: Secondary | ICD-10-CM | POA: Insufficient documentation

## 2021-11-19 DIAGNOSIS — O283 Abnormal ultrasonic finding on antenatal screening of mother: Secondary | ICD-10-CM | POA: Insufficient documentation

## 2021-12-07 NOTE — L&D Delivery Note (Addendum)
OB/GYN Faculty Practice Delivery Note ? ?Dawn Davies is a 31 y.o. G3P2002 s/p VAVD at [redacted]w[redacted]d. She was admitted for IOL 2/2 DFM and A2GDM on Metformin.  ? ?ROM: 2h 42m with blood-tinged clear fluid ?GBS Status:  NEGATIVE/-- (05/01 1959) ?Maximum Maternal Temperature: 98.47F ? ?Labor Progress: ?Initial SVE: 1.5/50.  Patient's induction was started with cytotec x 1 dose at 1840.  Pitocin was started at 2311 and has AROM at 0730. She then progressed to complete.  ? ?Delivery Date/Time: 04/07/22 @1009  ?Delivery: Presented to bedside due to fetal deceleration into the 80's. Cervix complete but -1 station. Attempted a few pushes with some minimal movement of fetal head. Dr. presented to bedside. Patient now able to get fetal head around 2+ with pushes. Fetal HR still 70-80's while able to auscultate.  ? ?Risks of vacuum assistance were discussed in detail, including but not limited to, bleeding, infection, damage to maternal tissues, fetal cephalohematoma, inability to achieve vaginal delivery of the head or shoulder dystocia that cannot be resolved by established maneuvers and need for emergency cesarean section.    ? ?Placed kiwi vacuum on fetal head and pulled with 3 pushes. Had two pop-offs. Reached fetal head to 3+ and noted direct OP positioning. Vacuum was replaced more posterior along sagittal suture. Delivered on 5th push. Shoulder and body delivered in usual fashion. Infant with spontaneous cry, placed on mother's abdomen, dried and stimulated. Cord clamped x 2 after 1-minute delay, and cut by FOB. Cord ABG 7.3. Minimal cord blood remaining, unable to get large sample. Placenta delivered spontaneously with gentle cord traction. Fundus firm with massage and Pitocin. Labia, perineum, vagina, and cervix inspected and found to have no lacerations.   ?Baby Weight: 2892g ? ?Placenta: 3 vessel, intact. Sent to pathology ?Complications: Fetal bradycardia with Vacuum assistance, ?placental abruption as etiology with  clots coming out after infant delivery ?Lacerations: None ?EBL: 400 mL ?Analgesia: Epidural  ? ?Infant:  ?APGAR (1 MIN):  9 ?APGAR (5 MINS): 9  ? ?Adrian Blackwater, DO, PGY-1 ?04/07/2022, 10:35 AM ? ?ATTESTATION ? ?I was present, gloved, and supervising throughout the delivery and agree with above documentation in the resident's note. My edits were added above.  ? ?Vacuum assistance performed by myself and Dr. 06/07/2022. Remainder of infant delivery performed myself. Placenta delivery by resident.  ? ?Adrian Blackwater, DO ?OB Fellow  ?[facility] [employer]) ?04/07/2022, 5:05 PM   ? ? ? ?

## 2022-01-21 DIAGNOSIS — J4521 Mild intermittent asthma with (acute) exacerbation: Secondary | ICD-10-CM | POA: Insufficient documentation

## 2022-01-21 DIAGNOSIS — F17210 Nicotine dependence, cigarettes, uncomplicated: Secondary | ICD-10-CM | POA: Insufficient documentation

## 2022-02-06 DIAGNOSIS — O35DXX0 Maternal care for other (suspected) fetal abnormality and damage, fetal gastrointestinal anomalies, not applicable or unspecified: Secondary | ICD-10-CM | POA: Insufficient documentation

## 2022-04-06 ENCOUNTER — Other Ambulatory Visit: Payer: Self-pay

## 2022-04-06 ENCOUNTER — Inpatient Hospital Stay (HOSPITAL_COMMUNITY)
Admission: AD | Admit: 2022-04-06 | Discharge: 2022-04-09 | DRG: 806 | Disposition: A | Discharge status: Home / Self Care | Payer: Medicaid Other | Attending: Family Medicine | Admitting: Family Medicine

## 2022-04-06 ENCOUNTER — Inpatient Hospital Stay (HOSPITAL_COMMUNITY): Payer: Medicaid Other | Admitting: Anesthesiology

## 2022-04-06 ENCOUNTER — Encounter (HOSPITAL_COMMUNITY): Payer: Self-pay | Admitting: Obstetrics and Gynecology

## 2022-04-06 DIAGNOSIS — F17210 Nicotine dependence, cigarettes, uncomplicated: Secondary | ICD-10-CM | POA: Diagnosis present

## 2022-04-06 DIAGNOSIS — Z3A39 39 weeks gestation of pregnancy: Secondary | ICD-10-CM

## 2022-04-06 DIAGNOSIS — F1320 Sedative, hypnotic or anxiolytic dependence, uncomplicated: Secondary | ICD-10-CM | POA: Diagnosis present

## 2022-04-06 DIAGNOSIS — O99344 Other mental disorders complicating childbirth: Secondary | ICD-10-CM | POA: Diagnosis present

## 2022-04-06 DIAGNOSIS — F411 Generalized anxiety disorder: Secondary | ICD-10-CM | POA: Diagnosis present

## 2022-04-06 DIAGNOSIS — O368130 Decreased fetal movements, third trimester, not applicable or unspecified: Secondary | ICD-10-CM | POA: Diagnosis present

## 2022-04-06 DIAGNOSIS — G40909 Epilepsy, unspecified, not intractable, without status epilepticus: Secondary | ICD-10-CM

## 2022-04-06 DIAGNOSIS — O99324 Drug use complicating childbirth: Secondary | ICD-10-CM | POA: Diagnosis present

## 2022-04-06 DIAGNOSIS — Z6791 Unspecified blood type, Rh negative: Secondary | ICD-10-CM

## 2022-04-06 DIAGNOSIS — R768 Other specified abnormal immunological findings in serum: Secondary | ICD-10-CM

## 2022-04-06 DIAGNOSIS — O4292 Full-term premature rupture of membranes, unspecified as to length of time between rupture and onset of labor: Secondary | ICD-10-CM | POA: Diagnosis present

## 2022-04-06 DIAGNOSIS — O24425 Gestational diabetes mellitus in childbirth, controlled by oral hypoglycemic drugs: Secondary | ICD-10-CM | POA: Diagnosis present

## 2022-04-06 DIAGNOSIS — F1120 Opioid dependence, uncomplicated: Secondary | ICD-10-CM | POA: Diagnosis present

## 2022-04-06 DIAGNOSIS — O26899 Other specified pregnancy related conditions, unspecified trimester: Secondary | ICD-10-CM

## 2022-04-06 DIAGNOSIS — Z8619 Personal history of other infectious and parasitic diseases: Secondary | ICD-10-CM

## 2022-04-06 DIAGNOSIS — E079 Disorder of thyroid, unspecified: Secondary | ICD-10-CM

## 2022-04-06 DIAGNOSIS — O26893 Other specified pregnancy related conditions, third trimester: Secondary | ICD-10-CM | POA: Diagnosis present

## 2022-04-06 DIAGNOSIS — O99334 Smoking (tobacco) complicating childbirth: Secondary | ICD-10-CM | POA: Diagnosis present

## 2022-04-06 DIAGNOSIS — O4202 Full-term premature rupture of membranes, onset of labor within 24 hours of rupture: Secondary | ICD-10-CM | POA: Diagnosis not present

## 2022-04-06 DIAGNOSIS — O24424 Gestational diabetes mellitus in childbirth, insulin controlled: Secondary | ICD-10-CM | POA: Diagnosis not present

## 2022-04-06 DIAGNOSIS — Z8673 Personal history of transient ischemic attack (TIA), and cerebral infarction without residual deficits: Secondary | ICD-10-CM

## 2022-04-06 DIAGNOSIS — O24419 Gestational diabetes mellitus in pregnancy, unspecified control: Secondary | ICD-10-CM

## 2022-04-06 DIAGNOSIS — F17200 Nicotine dependence, unspecified, uncomplicated: Secondary | ICD-10-CM | POA: Diagnosis present

## 2022-04-06 DIAGNOSIS — Z8759 Personal history of other complications of pregnancy, childbirth and the puerperium: Secondary | ICD-10-CM

## 2022-04-06 LAB — TYPE AND SCREEN
ABO/RH(D): AB NEG
Antibody Screen: NEGATIVE

## 2022-04-06 LAB — COMPREHENSIVE METABOLIC PANEL
ALT: 14 U/L (ref 0–44)
AST: 21 U/L (ref 15–41)
Albumin: 2.9 g/dL — ABNORMAL LOW (ref 3.5–5.0)
Alkaline Phosphatase: 128 U/L — ABNORMAL HIGH (ref 38–126)
Anion gap: 11 (ref 5–15)
BUN: 7 mg/dL (ref 6–20)
CO2: 20 mmol/L — ABNORMAL LOW (ref 22–32)
Calcium: 8.8 mg/dL — ABNORMAL LOW (ref 8.9–10.3)
Chloride: 107 mmol/L (ref 98–111)
Creatinine, Ser: 0.57 mg/dL (ref 0.44–1.00)
GFR, Estimated: >60 mL/min (ref >60)
Glucose, Bld: 69 mg/dL — ABNORMAL LOW (ref 70–99)
Potassium: 4.6 mmol/L (ref 3.5–5.1)
Sodium: 138 mmol/L (ref 135–145)
Total Bilirubin: 0.3 mg/dL (ref 0.3–1.2)
Total Protein: 5.8 g/dL — ABNORMAL LOW (ref 6.5–8.1)

## 2022-04-06 LAB — RAPID URINE DRUG SCREEN, HOSP PERFORMED
Amphetamines: NOT DETECTED
Barbiturates: NOT DETECTED
Benzodiazepines: POSITIVE — AB
Cocaine: NOT DETECTED
Opiates: NOT DETECTED
Tetrahydrocannabinol: NOT DETECTED

## 2022-04-06 LAB — CBC
HCT: 35.2 % — ABNORMAL LOW (ref 36.0–46.0)
Hemoglobin: 12.2 g/dL (ref 12.0–15.0)
MCH: 33.2 pg (ref 26.0–34.0)
MCHC: 34.7 g/dL (ref 30.0–36.0)
MCV: 95.9 fL (ref 80.0–100.0)
Platelets: 226 10*3/uL (ref 150–400)
RBC: 3.67 MIL/uL — ABNORMAL LOW (ref 3.87–5.11)
RDW: 13.2 % (ref 11.5–15.5)
WBC: 8.6 10*3/uL (ref 4.0–10.5)
nRBC: 0 % (ref 0.0–0.2)

## 2022-04-06 LAB — GLUCOSE, CAPILLARY
Glucose-Capillary: 81 mg/dL (ref 70–99)
Glucose-Capillary: 113 mg/dL — ABNORMAL HIGH (ref 70–99)

## 2022-04-06 LAB — WET PREP, GENITAL
Clue Cells Wet Prep HPF POC: NONE SEEN
Sperm: NONE SEEN
Trich, Wet Prep: NONE SEEN
WBC, Wet Prep HPF POC: >=10 — AB (ref <10)
Yeast Wet Prep HPF POC: NONE SEEN

## 2022-04-06 LAB — TSH: TSH: 0.519 u[IU]/mL (ref 0.350–4.500)

## 2022-04-06 LAB — HEPATITIS B SURFACE ANTIBODY,QUALITATIVE: Hep B S Ab: NONREACTIVE

## 2022-04-06 LAB — GROUP B STREP BY PCR: Group B strep by PCR: NEGATIVE

## 2022-04-06 MED ORDER — SOD CITRATE-CITRIC ACID 500-334 MG/5ML PO SOLN
30.0000 mL | ORAL | Status: DC | PRN
  Filled 2022-04-06: qty 30

## 2022-04-06 MED ORDER — PHENYLEPHRINE 80 MCG/ML (10ML) SYRINGE FOR IV PUSH (FOR BLOOD PRESSURE SUPPORT): 80.0000 ug | PREFILLED_SYRINGE | INTRAVENOUS | Status: DC | PRN

## 2022-04-06 MED ORDER — ACETAMINOPHEN 325 MG PO TABS: 650.0000 mg | ORAL_TABLET | ORAL | Status: DC | PRN

## 2022-04-06 MED ORDER — LACTATED RINGERS IV SOLN: INTRAVENOUS | Status: DC

## 2022-04-06 MED ORDER — MISOPROSTOL 25 MCG QUARTER TABLET: 25.0000 ug | ORAL_TABLET | ORAL | Status: DC | PRN

## 2022-04-06 MED ORDER — LIDOCAINE HCL (PF) 1 % IJ SOLN: 30.0000 mL | INTRAMUSCULAR | Status: DC | PRN

## 2022-04-06 MED ORDER — ALPRAZOLAM 0.25 MG PO TABS
1.0000 mg | ORAL_TABLET | Freq: Three times a day (TID) | ORAL | Status: DC | PRN
  Administered 2022-04-06 – 2022-04-09 (×8): 1 mg via ORAL
  Filled 2022-04-06 (×2): qty 4
  Filled 2022-04-06: qty 2
  Filled 2022-04-06 (×3): qty 4
  Filled 2022-04-06: qty 2
  Filled 2022-04-06: qty 4

## 2022-04-06 MED ORDER — ONDANSETRON HCL 4 MG/2ML IJ SOLN: 4.0000 mg | Freq: Four times a day (QID) | INTRAMUSCULAR | Status: DC | PRN

## 2022-04-06 MED ORDER — FENTANYL CITRATE (PF) 100 MCG/2ML IJ SOLN
50.0000 ug | INTRAMUSCULAR | Status: DC | PRN
  Administered 2022-04-06: 100 ug via INTRAVENOUS
  Filled 2022-04-06: qty 2

## 2022-04-06 MED ORDER — TERBUTALINE SULFATE 1 MG/ML IJ SOLN: 0.2500 mg | Freq: Once | INTRAMUSCULAR | Status: DC | PRN

## 2022-04-06 MED ORDER — OXYTOCIN BOLUS FROM INFUSION
333.0000 mL | Freq: Once | INTRAVENOUS | Status: AC
  Administered 2022-04-07: 333 mL via INTRAVENOUS

## 2022-04-06 MED ORDER — EPHEDRINE 5 MG/ML INJ: 10.0000 mg | INTRAVENOUS | Status: DC | PRN

## 2022-04-06 MED ORDER — OXYTOCIN-SODIUM CHLORIDE 30-0.9 UT/500ML-% IV SOLN
2.5000 [IU]/h | INTRAVENOUS | Status: DC
  Administered 2022-04-07: 2.5 [IU]/h via INTRAVENOUS

## 2022-04-06 MED ORDER — MISOPROSTOL 50MCG HALF TABLET
50.0000 ug | ORAL_TABLET | ORAL | Status: DC | PRN
  Administered 2022-04-06: 50 ug via BUCCAL
  Filled 2022-04-06: qty 1

## 2022-04-06 MED ORDER — LIDOCAINE-EPINEPHRINE (PF) 2 %-1:200000 IJ SOLN
INTRAMUSCULAR | Status: DC | PRN
  Administered 2022-04-06: 5 mL via EPIDURAL

## 2022-04-06 MED ORDER — NICOTINE 21 MG/24HR TD PT24
21.0000 mg | MEDICATED_PATCH | Freq: Every day | TRANSDERMAL | Status: DC
  Administered 2022-04-06: 21 mg via TRANSDERMAL
  Filled 2022-04-06 (×4): qty 1

## 2022-04-06 MED ORDER — DIPHENHYDRAMINE HCL 50 MG/ML IJ SOLN: 12.5000 mg | INTRAMUSCULAR | Status: DC | PRN

## 2022-04-06 MED ORDER — LACTATED RINGERS IV SOLN
500.0000 mL | INTRAVENOUS | Status: DC | PRN
  Administered 2022-04-06: 500 mL via INTRAVENOUS

## 2022-04-06 MED ORDER — FENTANYL-BUPIVACAINE-NACL 0.5-0.125-0.9 MG/250ML-% EP SOLN
12.0000 mL/h | EPIDURAL | Status: DC | PRN
  Administered 2022-04-06 – 2022-04-07 (×2): 12 mL/h via EPIDURAL
  Filled 2022-04-06 (×2): qty 250

## 2022-04-06 MED ORDER — LACTATED RINGERS IV SOLN
500.0000 mL | Freq: Once | INTRAVENOUS | Status: AC
  Administered 2022-04-06: 500 mL via INTRAVENOUS

## 2022-04-06 MED ORDER — BUPRENORPHINE HCL 8 MG SL SUBL
8.0000 mg | SUBLINGUAL_TABLET | Freq: Three times a day (TID) | SUBLINGUAL | Status: DC
  Administered 2022-04-06 – 2022-04-09 (×9): 8 mg via SUBLINGUAL
  Filled 2022-04-06 (×9): qty 1

## 2022-04-06 MED ORDER — OXYTOCIN-SODIUM CHLORIDE 30-0.9 UT/500ML-% IV SOLN
1.0000 m[IU]/min | INTRAVENOUS | Status: DC
  Administered 2022-04-06: 2 m[IU]/min via INTRAVENOUS
  Filled 2022-04-06: qty 500

## 2022-04-06 MED ORDER — BUPRENORPHINE HCL 8 MG SL SUBL: 8.0000 mg | SUBLINGUAL_TABLET | Freq: Every day | SUBLINGUAL | Status: DC

## 2022-04-06 NOTE — Progress Notes (Signed)
Labor Progress Note ?Dawn Davies is a 31 y.o. G3P2002 at [redacted]w[redacted]d who presented for IOL due to DFM at term and A2GDM.  ? ?S: Doing well. Comfortable with epidural. No concerns at this time.  ? ?O:  ?BP 93/64   Pulse 84   Temp 98 ?F (36.7 ?C) (Oral)   Resp 18   Ht 5\' 8"  (1.727 m)   Wt 90.7 kg   SpO2 100%   BMI 30.41 kg/m?  ? ?EFM: Baseline 110 bpm, moderate variability, + accels, no decels  ?Toco: Every 1-3 minutes  ? ?CVE: Dilation: 3 ?Effacement (%): 80 ?Cervical Position: Anterior ?Station: -2 ?Presentation: Vertex ?Exam by:: [email or web address], RN ? ?A&P: 31 y.o. G3P2002 [redacted]w[redacted]d  ? ?#Labor: Progressing well s/p Cytotec x1. Will start Pitocin 2x2 and reassess in 4 hours. Plan for AROM on next check as able.  ?#Pain: Epidural  ?#FWB: Cat 1  ?#GBS negative per PCR ? ?#A2GDM: CBGs at goal. Will continue q4hr glucose checks in latent phase. ? ?[redacted]w[redacted]d, MD ?11:44 PM ? ?

## 2022-04-06 NOTE — H&P (Addendum)
OBSTETRIC ADMISSION HISTORY AND PHYSICAL ? ?Dawn Davies is a 31 y.o. female G3P2002 with IUP at 4852w0d by LMP presenting for SOL 2/2 to PROM with subsequent early labor. She reports +FMs, No LOF, no VB, no blurry vision, headaches or peripheral edema, and RUQ pain.  She plans on breast feeding. She request BTL for birth control but has not filled out any paperwork, amenable to depo and an interval tubal. ?[facility]    ? ?Dating: By LMP --->  Estimated Date of Delivery: 04/13/22 ? ?Sono:   ? ?@[redacted]w[redacted]d , CWD, echogenic bowel much less prominent, cephalic presentation, anterior placental lie, 2354g, 55% EFW ? ? ?Prenatal History/Complications:  ?-- GAD ?-- Benzodiazepine dependence on Xanax  ?-- Opioid dependence on subutex  ?-- Hx of stroke/subarachnoid hemorrhage 2 weeks postpartum in 2013, imaging and clinical presentation consistent with RCVS at that time  ?-- Hx of pre-eclampsia, BP WNL this pregnancy  ?-- Hx of Hepatitis C ?-- Tobacco abuse  ?-- Seizure disorder, not on medications   ?--[facility]  ?--Fetal echogenic bowel (mild) on US  ? ?Past Medical History: ?Past Medical History:  ?Diagnosis Date  ? Anxiety   ? Reversible cerebrovascular vasoconstriction syndrome 2013  ? 2 weeks postpartum  ? ? ?Past Surgical History: ?Past Surgical History:  ?Procedure Laterality Date  ? MULTIPLE TOOTH EXTRACTIONS    ? ? ?Obstetrical History: ?OB History   ? ? Gravida  ?3  ? Para  ?2  ? Term  ?2  ? Preterm  ?   ? AB  ?   ? Living  ?2  ?  ? ? SAB  ?   ? IAB  ?   ? Ectopic  ?   ? Multiple  ?0  ? Live Births  ?2  ?   ?  ?  ? ? ?Social History ?Social History  ? ?Socioeconomic History  ? Marital status: Legally Separated  ?  Spouse name: Not on file  ? Number of children: 1  ? Years of education: Not on file  ? Highest education level: Not on file  ?Occupational History  ? Not on file  ?Tobacco Use  ? Smoking status: Every Day  ?  Packs/day: 0.50  ?  Types: Cigarettes  ?  Smokeless tobacco: Never  ?Vaping Use  ? Vaping Use: Never used  ?Substance and Sexual Activity  ? Alcohol use: Not Currently  ? Drug use: Not Currently  ? Sexual activity: Yes  ?  Birth control/protection: None  ?Other Topics Concern  ? Not on file  ?Social History Narrative  ? Pt is left handed  ? Lives in 2 story home with her daughter, brother and both parents  ? 10th grade education  ? Has 1 child  ? On disability  ?   ? Pt lives with fiance in [place] and two other children live with other family members.  ? ?Social Determinants of Health  ? ?Financial Resource Strain: Not on file  ?Food Insecurity: Not on file  ?Transportation Needs: Not on file  ?Physical Activity: Not on file  ?Stress: Not on file  ?Social Connections: Not on file  ? ? ?Family History: ?Family History  ?Problem Relation Age of Onset  ? Hypertension Mother   ? COPD Mother   ? Juvenile idiopathic arthritis Daughter   ? Diabetes Paternal Grandmother   ? ? ?Allergies: ?Allergies  ?Allergen Reactions  ? Morphine Anaphylaxis and Rash  ?  Hydrocodone-Acetaminophen Itching and Other (See Comments)  ?  Reaction not specified by patient ?Reaction not specified by pat ?  ? Naloxone Nausea And Vomiting  ? Varenicline Other (See Comments)  ?  Xyzal-Headaches  ? ? ?Medications Prior to Admission  ?Medication Sig Dispense Refill Last Dose  ? ALPRAZolam (XANAX) 1 MG tablet Take 1 mg by mouth 3 (three) times daily as needed for anxiety.   04/05/2022  ? buprenorphine (SUBUTEX) 2 MG SUBL SL tablet Place 8 mg under the tongue daily. Takes 2.5 tabs daily   04/05/2022  ? sertraline (ZOLOFT) 50 MG tablet Take by mouth.   04/05/2022  ? acetaminophen (TYLENOL) 325 MG tablet Take 2 tablets (650 mg total) by mouth every 6 (six) hours as needed (for pain scale < 4). 30 tablet 0 More than a month  ? Blood Pressure Monitor MISC For regular home bp monitoring during pregnancy 1 each 0 More than a month  ? ferrous sulfate 325 (65 FE) MG EC tablet Take by mouth.     ?  gabapentin (NEURONTIN) 600 MG tablet Take 600 mg by mouth 3 (three) times daily.    More than a month  ? senna-docusate (SENOKOT-S) 8.6-50 MG tablet Take 2 tablets by mouth daily. 30 tablet 0 More than a month  ? ? ? ?Review of Systems  ? ?All systems reviewed and negative except as stated in HPI ? ?Blood pressure 116/69, pulse 73, temperature 98.5 ?F (36.9 ?C), temperature source Oral, resp. rate 18, height 5\' 8"  (1.727 m), weight 90.7 kg, SpO2 98 %. ?General appearance: alert, cooperative, and no distress ?Lungs: clear to auscultation bilaterally ?Heart: regular rate and rhythm ?Abdomen: soft, non-tender; bowel sounds normal ?Pelvic: normal external genitalia ?Extremities: Homans sign is negative, no sign of DVT ?Presentation: cephalic ?Fetal monitoringBaseline: 120 bpm, Variability: Good {> 6 bpm), and Accelerations: Reactive ?Uterine activityFrequency: Every 3-6 minutes ? ?Dilation: 2.5 ?Effacement (%): 60 ?Station: -3 ?Exam by:: k. jones ? ? ?Prenatal labs: ?ABO, Rh: --/--/AB NEG (05/01 1631)  ?Antibody: NEG (05/01 1631)  ?Rubella: Immune (11/17 0000)  ?RPR: Nonreactive (11/17 0000)  ?HBsAg: Negative (11/17 0000)  ?HIV: Non-reactive (11/17 0000)  ?GBS:   Unknown ?Elevated 3 hr GTT ?Genetic screening  All negative reported by patient, unsure of what tests were done ?Anatomy 08[birth year]27[birth year]1984: echogenic bowel of fetus  ? ?Prenatal Transfer Tool  ?Maternal Diabetes: Yes:  Diabetes Type:  Insulin/Medication controlled ?Genetic Screening: Normal ?Maternal Ultrasounds/Referrals: Echogenic bowel ?Fetal Ultrasounds or other Referrals:  Referred to Materal Fetal Medicine  ?Maternal Substance Abuse:  No, patient on subutex ?Significant Maternal Medications:  Meds include: Other: Xanax ?Significant Maternal Lab Results: Other: GBS pending ? ?Results for orders placed or performed during the hospital encounter of 04/06/22 (from the past 24 hour(s))  ?Wet prep, genital  ? Collection Time: 04/06/22  3:39 PM  ?Result Value Ref Range  ?  Yeast Wet Prep HPF POC NONE SEEN NONE SEEN  ? Trich, Wet Prep NONE SEEN NONE SEEN  ? Clue Cells Wet Prep HPF POC NONE SEEN NONE SEEN  ? WBC, Wet Prep HPF POC >=10 (A) <10  ? Sperm NONE SEEN   ?CBC  ? Collection Time: 04/06/22  4:31 PM  ?Result Value Ref Range  ? WBC 8.6 4.0 - 10.5 K/uL  ? RBC 3.67 (L) 3.87 - 5.11 MIL/uL  ? Hemoglobin 12.2 12.0 - 15.0 g/dL  ? HCT 35.2 (L) 36.0 - 46.0 %  ? MCV 95.9 80.0 - 100.0 fL  ?  MCH 33.2 26.0 - 34.0 pg  ? MCHC 34.7 30.0 - 36.0 g/dL  ? RDW 13.2 11.5 - 15.5 %  ? Platelets 226 150 - 400 K/uL  ? nRBC 0.0 0.0 - 0.2 %  ?Hepatitis B surface antibody,qualitative  ? Collection Time: 04/06/22  4:31 PM  ?Result Value Ref Range  ? Hep B S Ab NON REACTIVE NON REACTIVE  ?TSH  ? Collection Time: 04/06/22  4:31 PM  ?Result Value Ref Range  ? TSH 0.519 0.350 - 4.500 uIU/mL  ?Type and screen [facility]  ? Collection Time: 04/06/22  4:31 PM  ?Result Value Ref Range  ? ABO/RH(D) AB NEG   ? Antibody Screen NEG   ? Sample Expiration    ?  04/09/2022,2359 ?Performed at [facility], [address], [place], [place] [address] ?  ?Glucose, capillary  ? Collection Time: 04/06/22  5:39 PM  ?Result Value Ref Range  ? Glucose-Capillary 81 70 - 99 mg/dL  ? ? ?Patient Active Problem List  ? Diagnosis Date Noted  ? Thyrotoxicosis 02/06/2020  ? Pregnancy complicated by subutex maintenance, antepartum (HCC) 02/06/2020  ? UTI (urinary tract infection) during pregnancy, second trimester 11/16/2019  ? Pap smear of cervix with ASCUS, cannot exclude HGSIL 09/01/2019  ? Opioid dependence (HCC) 08/24/2019  ? Rh negative, antepartum 08/20/2019  ? History of pre-eclampsia 08/18/2019  ? Hepatitis C 08/18/2019  ? Ovarian cyst 07/24/2019  ? History of completed stroke 07/12/2019  ? Smoker 07/12/2019  ? Moderate episode of recurrent major depressive disorder (HCC) 03/08/2019  ? Benzodiazepine dependence (HCC) 09/21/2018  ? Seizure disorder (HCC) 06/22/2017  ? Generalized anxiety disorder 07/23/2016  ?  Cerebral vasospasm 03/10/2012  ? Leukocytosis 03/10/2012  ? Subarachnoid hemorrhage (HCC) 03/03/2012  ? ? ?Assessment/Plan:  ?Briget Shaheed is a 31 y.o. G3P2002 at [redacted]w[redacted]d here for IOL 2/2 DFM (with multiple co-morb

## 2022-04-06 NOTE — MAU Provider Note (Signed)
Per RN: Ms. Dawn Davies is a 31 y.o. G3P2002 at [redacted]w[redacted]d  who presents to MAU for labor check but reports no FM since 11am.  Pregnancy complicated by history of preeclampsia in previous pregnancy, A2GDM, OUD on Suboxone, tobacco use, history of subarachnoid hemorrhage and stroke during postpartum period in first pregnancy, chronic hepatitis C. ? ?O: BP 115/71   Pulse 87  ? ?Cervical exam:  ?Dilation: 1.5 ?Effacement (%): 50 ?Cervical Position: Anterior ?Presentation: Vertex ?Exam by:: Erle Crocker RN ? ?Fetal Monitoring: ?Baseline: 120 ?Variability: mod ?Accelerations: no ?Decelerations: no ?Contractions: irreg ? ?MDM: NST nonreactive.  Patient felt 3 FMs after EFM applied.  Consult with Dr. Crissie Reese, plan for admit and IOL. ? ?A: ?SIUP at [redacted]w[redacted]d  ?Decreased fetal movement ?Nonreactive NST ? ?P: ?Admit to L&D ?Management per labor team-Dr. Annia Friendly notified ? ?Donette Larry, CNM ?04/06/2022 4:16 PM ? ?

## 2022-04-06 NOTE — MAU Note (Signed)
Patient Presents to MAU VIA EMS, Complaining of Contractions since 1230 3 minutes apart.  Patient denies Vaginal leakage of Fluid and Bleeding .  Complains of Clear /Pinkish Discharge. Patient States " I have not felt movement since 0830 but I was asleep".  ?

## 2022-04-06 NOTE — Anesthesia Procedure Notes (Signed)
Epidural ?Patient location during procedure: OB ?Start time: 04/06/2022 9:15 PM ?End time: 04/06/2022 9:25 PM ? ?Staffing ?Anesthesiologist: Elmer Picker, MD ?Performed: anesthesiologist  ? ?Preanesthetic Checklist ?Completed: patient identified, IV checked, risks and benefits discussed, monitors and equipment checked, pre-op evaluation and timeout performed ? ?Epidural ?Patient position: sitting ?Prep: DuraPrep and site prepped and draped ?Patient monitoring: continuous pulse ox, blood pressure, heart rate and cardiac monitor ?Approach: midline ?Location: L3-L4 ?Injection technique: LOR air ? ?Needle:  ?Needle type: Tuohy  ?Needle gauge: 17 G ?Needle length: 9 cm ?Needle insertion depth: 6 cm ?Catheter type: closed end flexible ?Catheter size: 19 Gauge ?Catheter at skin depth: 12 cm ?Test dose: negative ? ?Assessment ?Sensory level: T8 ?Events: blood not aspirated, injection not painful, no injection resistance, no paresthesia and negative IV test ? ?Additional Notes ?Patient identified. Risks/Benefits/Options discussed with patient including but not limited to bleeding, infection, nerve damage, paralysis, failed block, incomplete pain control, headache, blood pressure changes, nausea, vomiting, reactions to medication both or allergic, itching and postpartum back pain. Confirmed with bedside nurse the patient's most recent platelet count. Confirmed with patient that they are not currently taking any anticoagulation, have any bleeding history or any family history of bleeding disorders. Patient expressed understanding and wished to proceed. All questions were answered. Sterile technique was used throughout the entire procedure. Please see nursing notes for vital signs. Test dose was given through epidural catheter and negative prior to continuing to dose epidural or start infusion. Warning signs of high block given to the patient including shortness of breath, tingling/numbness in hands, complete motor block, or  any concerning symptoms with instructions to call for help. Patient was given instructions on fall risk and not to get out of bed. All questions and concerns addressed with instructions to call with any issues or inadequate analgesia.  Reason for block:procedure for pain ? ? ? ?

## 2022-04-06 NOTE — Anesthesia Preprocedure Evaluation (Addendum)
Anesthesia Evaluation  ?Patient identified by MRN, date of birth, ID band ?Patient awake ? ? ? ?Reviewed: ?Allergy & Precautions, NPO status , Patient's Chart, lab work & pertinent test results ? ?Airway ?Mallampati: II ? ?TM Distance: >3 FB ?Neck ROM: Full ? ? ? Dental ?no notable dental hx. ?(+) Teeth Intact, Dental Advisory Given ?  ?Pulmonary ?neg pulmonary ROS, Current Smoker,  ?  ?Pulmonary exam normal ?breath sounds clear to auscultation ? ? ? ? ? ? Cardiovascular ?negative cardio ROS ?Normal cardiovascular exam ?Rhythm:Regular Rate:Normal ? ? ?  ?Neuro/Psych ?Seizures - (No medications and denies any recent seizure, last 2019), Well Controlled,  PSYCHIATRIC DISORDERS Anxiety Depression Hx of stroke/subarachnoid hemorrhage 2 weeks postpartum in 2013, imaging and clinical presentation consistent with RCVS at that time  ?  ? GI/Hepatic ?negative GI ROS, (+)  ?  ? substance abuse ? , Hepatitis -, C  ?Endo/Other  ?diabetes, Gestational ? Renal/GU ?negative Renal ROS  ?negative genitourinary ?  ?Musculoskeletal ? ?(+) narcotic dependent ? Abdominal ?  ?Peds ? Hematology ?negative hematology ROS ?(+)   ?Anesthesia Other Findings ?SA ? Reproductive/Obstetrics ?(+) Pregnancy ? ?  ? ? ? ? ? ? ? ? ? ? ? ? ? ?  ?  ? ? ? ? ? ? ? ?Anesthesia Physical ?Anesthesia Plan ? ?ASA: 3 ? ?Anesthesia Plan: Epidural  ? ?Post-op Pain Management:   ? ?Induction:  ? ?PONV Risk Score and Plan: Treatment may vary due to age or medical condition ? ?Airway Management Planned: Natural Airway ? ?Additional Equipment:  ? ?Intra-op Plan:  ? ?Post-operative Plan:  ? ?Informed Consent: I have reviewed the patients History and Physical, chart, labs and discussed the procedure including the risks, benefits and alternatives for the proposed anesthesia with the patient or authorized representative who has indicated his/her understanding and acceptance.  ? ? ? ? ? ?Plan Discussed with: Anesthesiologist ? ?Anesthesia  Plan Comments: (Patient identified. Risks, benefits, options discussed with patient including but not limited to bleeding, infection, nerve damage, paralysis, failed block, incomplete pain control, headache, blood pressure changes, nausea, vomiting, reactions to medication, itching, and post partum back pain. Confirmed with bedside nurse the patient's most recent platelet count. Confirmed with the patient that they are not taking any anticoagulation, have any bleeding history or any family history of bleeding disorders. Patient expressed understanding and wishes to proceed. All questions were answered. )  ? ? ? ? ? ? ?Anesthesia Quick Evaluation ? ?

## 2022-04-07 ENCOUNTER — Encounter (HOSPITAL_COMMUNITY): Payer: Self-pay | Admitting: Family Medicine

## 2022-04-07 DIAGNOSIS — O4202 Full-term premature rupture of membranes, onset of labor within 24 hours of rupture: Secondary | ICD-10-CM | POA: Diagnosis not present

## 2022-04-07 DIAGNOSIS — O24419 Gestational diabetes mellitus in pregnancy, unspecified control: Secondary | ICD-10-CM

## 2022-04-07 DIAGNOSIS — R768 Other specified abnormal immunological findings in serum: Secondary | ICD-10-CM

## 2022-04-07 DIAGNOSIS — O24424 Gestational diabetes mellitus in childbirth, insulin controlled: Secondary | ICD-10-CM | POA: Diagnosis not present

## 2022-04-07 DIAGNOSIS — O99334 Smoking (tobacco) complicating childbirth: Secondary | ICD-10-CM | POA: Diagnosis not present

## 2022-04-07 DIAGNOSIS — O368130 Decreased fetal movements, third trimester, not applicable or unspecified: Secondary | ICD-10-CM | POA: Diagnosis not present

## 2022-04-07 DIAGNOSIS — Z3A39 39 weeks gestation of pregnancy: Secondary | ICD-10-CM

## 2022-04-07 DIAGNOSIS — E079 Disorder of thyroid, unspecified: Secondary | ICD-10-CM

## 2022-04-07 LAB — GLUCOSE, CAPILLARY
Glucose-Capillary: 122 mg/dL — ABNORMAL HIGH (ref 70–99)
Glucose-Capillary: 78 mg/dL (ref 70–99)
Glucose-Capillary: 83 mg/dL (ref 70–99)

## 2022-04-07 LAB — GC/CHLAMYDIA PROBE AMP (~~LOC~~) NOT AT ARMC
Chlamydia: NEGATIVE
Comment: NEGATIVE
Comment: NORMAL
Neisseria Gonorrhea: NEGATIVE

## 2022-04-07 LAB — RPR: RPR Ser Ql: NONREACTIVE

## 2022-04-07 LAB — HCV RNA QUANT: HCV Quantitative: NOT DETECTED IU/mL (ref >50)

## 2022-04-07 MED ORDER — DIPHENHYDRAMINE HCL 25 MG PO CAPS: 25.0000 mg | ORAL_CAPSULE | Freq: Four times a day (QID) | ORAL | Status: DC | PRN

## 2022-04-07 MED ORDER — BENZOCAINE-MENTHOL 20-0.5 % EX AERO: 1.0000 "application " | INHALATION_SPRAY | CUTANEOUS | Status: DC | PRN

## 2022-04-07 MED ORDER — FERROUS SULFATE 325 (65 FE) MG PO TABS
325.0000 mg | ORAL_TABLET | ORAL | Status: DC
  Administered 2022-04-08: 325 mg via ORAL
  Filled 2022-04-07: qty 1

## 2022-04-07 MED ORDER — MEDROXYPROGESTERONE ACETATE 150 MG/ML IM SUSP: 150.0000 mg | INTRAMUSCULAR | Status: DC | PRN

## 2022-04-07 MED ORDER — ONDANSETRON HCL 4 MG/2ML IJ SOLN: 4.0000 mg | INTRAMUSCULAR | Status: DC | PRN

## 2022-04-07 MED ORDER — BUPRENORPHINE HCL 2 MG SL SUBL
4.0000 mg | SUBLINGUAL_TABLET | Freq: Once | SUBLINGUAL | Status: AC
  Administered 2022-04-07: 4 mg via SUBLINGUAL
  Filled 2022-04-07: qty 2

## 2022-04-07 MED ORDER — SIMETHICONE 80 MG PO CHEW
80.0000 mg | CHEWABLE_TABLET | ORAL | Status: DC | PRN
  Administered 2022-04-07: 80 mg via ORAL
  Filled 2022-04-07: qty 1

## 2022-04-07 MED ORDER — WITCH HAZEL-GLYCERIN EX PADS: 1.0000 "application " | MEDICATED_PAD | CUTANEOUS | Status: DC | PRN

## 2022-04-07 MED ORDER — DIBUCAINE (PERIANAL) 1 % EX OINT: 1.0000 "application " | TOPICAL_OINTMENT | CUTANEOUS | Status: DC | PRN

## 2022-04-07 MED ORDER — ACETAMINOPHEN 500 MG PO TABS
1000.0000 mg | ORAL_TABLET | Freq: Four times a day (QID) | ORAL | Status: DC
  Administered 2022-04-07 – 2022-04-09 (×8): 1000 mg via ORAL
  Filled 2022-04-07 (×8): qty 2

## 2022-04-07 MED ORDER — COCONUT OIL OIL
1.0000 "application " | TOPICAL_OIL | Status: DC | PRN
  Administered 2022-04-08: 1 via TOPICAL

## 2022-04-07 MED ORDER — KETOROLAC TROMETHAMINE 30 MG/ML IJ SOLN
30.0000 mg | Freq: Once | INTRAMUSCULAR | Status: AC
  Administered 2022-04-07: 30 mg via INTRAVENOUS
  Filled 2022-04-07: qty 1

## 2022-04-07 MED ORDER — ONDANSETRON HCL 4 MG PO TABS: 4.0000 mg | ORAL_TABLET | ORAL | Status: DC | PRN

## 2022-04-07 MED ORDER — PRENATAL MULTIVITAMIN CH
1.0000 | ORAL_TABLET | Freq: Every day | ORAL | Status: DC
  Administered 2022-04-08 – 2022-04-09 (×2): 1 via ORAL
  Filled 2022-04-07 (×2): qty 1

## 2022-04-07 MED ORDER — IBUPROFEN 600 MG PO TABS
600.0000 mg | ORAL_TABLET | Freq: Four times a day (QID) | ORAL | Status: DC
  Administered 2022-04-07 – 2022-04-09 (×9): 600 mg via ORAL
  Filled 2022-04-07 (×9): qty 1

## 2022-04-07 MED ORDER — TETANUS-DIPHTH-ACELL PERTUSSIS 5-2.5-18.5 LF-MCG/0.5 IM SUSY: 0.5000 mL | PREFILLED_SYRINGE | Freq: Once | INTRAMUSCULAR | Status: DC | PRN

## 2022-04-07 MED ORDER — SENNOSIDES-DOCUSATE SODIUM 8.6-50 MG PO TABS
2.0000 | ORAL_TABLET | Freq: Every day | ORAL | Status: DC
  Administered 2022-04-08 – 2022-04-09 (×2): 2 via ORAL
  Filled 2022-04-07 (×2): qty 2

## 2022-04-07 NOTE — Lactation Note (Signed)
This note was copied from a baby's chart. ?Lactation Consultation Note ? ?Patient Name: Dawn Davies ?Today's Date: 04/07/2022 ?Reason for consult: L&D Initial assessment;1st time breastfeeding;Term;Breastfeeding assistance;Other (Comment) (LC LD visit at 50 mins PP,Baby awake, rooting, and congestion, LC suctioned mouth w/ the bulb syringe - clear secretions. Latched for 4-5 sucks/ released. Suctioned again, and latched longer. Still feeding with swallows. mom aware she will be seen MBU.) ?LC reassured mom when baby latches and breastfeeds it will clear the nasal congestion.  ?Age:87 mins old  ?Mom on Subutex 8 mg 3x/ per day - L2 ?Xanax 1mg  3x's per day - prn  ? ? ?Maternal Data ?Does the patient have breastfeeding experience prior to this delivery?: No ? ?Feeding ?Mother's Current Feeding Choice: Breast Milk and Formula ? ?LATCH Score ?Latch: Repeated attempts needed to sustain latch, nipple held in mouth throughout feeding, stimulation needed to elicit sucking reflex. ? ?Audible Swallowing: A few with stimulation ? ?Type of Nipple: Everted at rest and after stimulation (compressible areola) ? ?Comfort (Breast/Nipple): Soft / non-tender ? ?Hold (Positioning): Assistance needed to correctly position infant at breast and maintain latch. ? ?LATCH Score: 7 ? ? ?Lactation Tools Discussed/Used ?  ? ?Interventions ?Interventions: Breast feeding basics reviewed;Assisted with latch;Skin to skin;Breast compression;Adjust position;Education ? ?Discharge ?  ? ?Consult Status ?Consult Status: Follow-up from L&D ?Date: 04/07/22 ?Follow-up type: In-patient ? ? ? ?06/07/22 Dawayne Ohair ?04/07/2022, 11:16 AM ? ? ? ?

## 2022-04-07 NOTE — Lactation Note (Signed)
This note was copied from a baby's chart. ?Lactation Consultation Note ? ?Patient Name: Dawn Davies ?Today's Date: 04/07/2022 ?Reason for consult: Initial assessment;1st time breastfeeding ?Age:32 hours, see mom's MR regarding medications. ?P3, term female infant. ?Per mom, infant has not latched on MBU, she has made a few attempts. ?Initially infant was fussy, LC used breast model and mom self express and infant was given 2 mls of EBM. ?Mom made a few attempts initially infant was on and off breast, Mom latched infant on her right breast, using the cradle hold position, infant started sustaining latch and still BF after 10 minutes when LC left the room.  ?Mom will continue to breastfeed infant according hunger cues, on demand, 8 to 12+ times within 24 hours, skin to skin. ?Mom will attempt to latch infant on both breast during a feeding. ?Mom knows to ask RN/LC for further latch assistance if needed. ?Mom made aware of O/P services, breastfeeding support groups, community resources, and our phone # for post-discharge questions.   ? ?Maternal Data ?Has patient been taught Hand Expression?: Yes ? ?Feeding ?Mother's Current Feeding Choice: Breast Milk ? ?LATCH Score ?Latch: Repeated attempts needed to sustain latch, nipple held in mouth throughout feeding, stimulation needed to elicit sucking reflex. ? ?Audible Swallowing: A few with stimulation ? ?Type of Nipple: Everted at rest and after stimulation ? ?Comfort (Breast/Nipple): Soft / non-tender ? ?Hold (Positioning): Assistance needed to correctly position infant at breast and maintain latch. ? ?LATCH Score: 7 ? ? ?Lactation Tools Discussed/Used ?  ? ?Interventions ?Interventions: Breast feeding basics reviewed;Assisted with latch;Skin to skin;Breast compression;Hand express;Support pillows;Adjust position;Position options;Expressed milk;Education;LC Services brochure ? ?Discharge ?  ? ?Consult Status ?Consult Status: Follow-up ?Date: 04/08/22 ?Follow-up type:  In-patient ? ? ? ?[name] ?04/07/2022, 5:15 PM ? ? ? ?

## 2022-04-07 NOTE — Progress Notes (Signed)
Labor Progress Note ?Dawn Davies is a 31 y.o. G3P2002 at [redacted]w[redacted]d who presented for IOL due to DFM at term and A2GDM.  ? ?S: Doing well. No concerns. Comfortable with epidural. Reports some increased bloody show.  ? ?O:  ?BP 119/74   Pulse (!) 59   Temp 98.2 ?F (36.8 ?C) (Oral)   Resp 18   Ht 5\' 8"  (1.727 m)   Wt 90.7 kg   SpO2 100%   BMI 30.41 kg/m?  ? ?EFM: Baseline 105 bpm, moderate variability, + accels, no decels  ? ?CVE: Dilation: 4 ?Effacement (%): 80 ?Cervical Position: Anterior ?Station: -2 ?Presentation: Vertex ?Exam by:: [email or web address] ? ?A&P: 31 y.o. 38 [redacted]w[redacted]d  ? ?#Labor: Progressing well. AROM performed with small amount of clear fluid. Fetal head well applied. Will continue Pitocin and reassess in 4 hours, sooner as needed.  ?#Pain: Epidural  ?#FWB: Cat 1 ?#GBS negative ? ?#A2GDM: CBGs within normal range. Will continue with q4hr glucose checks in latent phase.  ? ?[redacted]w[redacted]d, MD ?7:52 AM ? ?

## 2022-04-07 NOTE — Anesthesia Postprocedure Evaluation (Signed)
Anesthesia Post Note ? ?Patient: Dawn Davies ? ?Procedure(s) Performed: AN AD HOC LABOR EPIDURAL ? ?  ? ?Patient location during evaluation: Mother Baby ?Anesthesia Type: Epidural ?Level of consciousness: awake and alert ?Pain management: pain level controlled ?Vital Signs Assessment: post-procedure vital signs reviewed and stable ?Respiratory status: spontaneous breathing, nonlabored ventilation and respiratory function stable ?Cardiovascular status: stable ?Postop Assessment: no headache, no backache and epidural receding ?Anesthetic complications: no ? ? ?No notable events documented. ? ?Last Vitals:  ?Vitals:  ? 04/07/22 1230 04/07/22 1330  ?BP: (!) 110/59 112/73  ?Pulse: 70 (!) 57  ?Resp: 18 18  ?Temp: 36.4 ?C (!) 36.4 ?C  ?SpO2:    ?  ?Last Pain:  ?Vitals:  ? 04/07/22 1330  ?TempSrc: Oral  ?PainSc: 7   ? ?Pain Goal: Patients Stated Pain Goal: 3 (04/07/22 1230) ? ?  ?  ?  ?  ?  ?  ?  ? ?[name] ? ? ? ? ?

## 2022-04-07 NOTE — Discharge Summary (Signed)
? ?  Postpartum Discharge Summary ? ?Date of Service updated ? ?   ?Patient Name: Dawn Davies ?DOB: [birth year] ?MRN: [id number] ? ?Date of admission: 04/06/2022 ?Delivery date:04/07/2022  ?Delivering provider: Patriciaann Clan  ?Date of discharge: 04/09/2022 ? ?Admitting diagnosis: Decreased fetal movement [O36.8190] ?Intrauterine pregnancy: [redacted]w[redacted]d    ?Secondary diagnosis:  Active Problems: ?  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  Smoker ?  History of pre-eclampsia (none in current pregnancy)  ?  Seizure disorder ([place] (no medications)  ?  Benzodiazepine dependence ([place] ?  Rh negative, antepartum ?  Pregnancy complicated by subutex maintenance, antepartum ([place] ?  Hepatitis C antibody positive in blood (RNA NOT detected--cleared infection)   ?  Vacuum-assisted vaginal delivery ?  Thyroid condition ?  GDM, class A2 ? ?Additional problems: None     ?Discharge diagnosis: Term Pregnancy Delivered                                              ?Post partum procedures:rhogam ?Augmentation: AROM, Pitocin, and Cytotec ?Complications: None ? ?Hospital course: Induction of Labor With Vaginal Delivery   ?31y.o. yo G3P3003 at 341w1das admitted to the hospital 04/06/2022 for induction of labor.  Indication for induction:  DFM/A2GDM .  Patient had an uncomplicated labor course as follows but then required a vacuum assistance vaginal delivery due to fetal bradycardia in OP position: ?Membrane Rupture Time/Date: 7:30 AM ,04/07/2022   ?Delivery Method:Vaginal, Spontaneous  ?Episiotomy: None  ?Lacerations:  None  ?Details of delivery can be found in separate delivery note.  Patient had a routine postpartum course. Her BP remained WNL. She was continued on her subutex and xanax as scheduled. Seen by social work who has cleared her and infant for discharge home and will follow up if needed. Patient is discharged home 04/09/22. ? ?Newborn Data: ?Birth date:04/07/2022  ?Birth time:10:09 AM  ?Gender:Female  ?Living status:Living  ?Apgars:9  ,9  ?Weight:2892 g  ? ?Magnesium Sulfate received: No ?BMZ received: No ?Rhophylac:No ?MMR:No ?T-DaP:Given prenatally ?Flu: No ?Transfusion:No ? ?Physical exam  ?Vitals:  ? 04/08/22 0515 04/08/22 1303 04/08/22 2152 04/09/22 0600  ?BP: 108/76 121/89 111/74 115/77  ?Pulse: 80 71 67 64  ?Resp: '18 19 18 18  ' ?Temp: 98.2 ?F (36.8 ?C) 98 ?F (36.7 ?C) 98.2 ?F (36.8 ?C) 98.1 ?F (36.7 ?C)  ?TempSrc: Oral Oral Oral Oral  ?SpO2: 100% 100%    ?Weight:      ?Height:      ? ?General: alert, cooperative, and no distress ?Lochia: appropriate ?Uterine Fundus: firm ?Incision: N/A ?DVT Evaluation: No significant calf/ankle edema. ?Labs: ?Lab Results  ?Component Value Date  ? WBC 8.6 04/06/2022  ? HGB 12.2 04/06/2022  ? HCT 35.2 (L) 04/06/2022  ? MCV 95.9 04/06/2022  ? PLT 226 04/06/2022  ? ? ?  Latest Ref Rng & Units 04/09/2022  ?  4:46 AM  ?CMP  ?Glucose 70 - 99 mg/dL 75    ? ?Edinburgh Score: ? ?  04/08/2022  ? 10:30 AM  ?Edinburgh Postnatal Depression Scale Screening Tool  ?I have been able to laugh and see the funny side of things. 0  ?I have looked forward with enjoyment to things. 1  ?I have blamed myself unnecessarily when things went wrong. 1  ?I have been anxious or worried for no good reason. 2  ?I have  felt scared or panicky for no good reason. 2  ?Things have been getting on top of me. 1  ?I have been so unhappy that I have had difficulty sleeping. 1  ?I have felt sad or miserable. 1  ?I have been so unhappy that I have been crying. 2  ?The thought of harming myself has occurred to me. 0  ?Edinburgh Postnatal Depression Scale Total 11  ? ? ? ?After visit meds:  ?Allergies as of 04/09/2022   ? ?   Reactions  ? Morphine Anaphylaxis, Rash  ? Hydrocodone-acetaminophen Itching, Other (See Comments)  ? Reaction not specified by patient ?Reaction not specified by pat  ? Naloxone Nausea And Vomiting  ? Varenicline Other (See Comments)  ? Xyzal-Headaches  ? ?  ? ?  ?Medication List  ?  ? ?STOP taking these medications   ? ?GoodSense  Aspirin Low Dose 81 MG EC tablet ?Generic drug: aspirin ?  ?metFORMIN 500 MG tablet ?Commonly known as: GLUCOPHAGE ?  ?omeprazole 40 MG capsule ?Commonly known as: PRILOSEC ?  ? ?  ? ?TAKE these medications   ? ?acetaminophen 325 MG tablet ?Commonly known as: Tylenol ?Take 2 tablets (650 mg total) by mouth every 6 (six) hours as needed (for pain scale < 4). ?  ?Allergy Relief 10 MG tablet ?Generic drug: loratadine ?Take 10 mg by mouth daily. ?  ?ALPRAZolam 1 MG tablet ?Commonly known as: Duanne Moron ?Take 1 mg by mouth 3 (three) times daily as needed for anxiety. ?  ?Blood Pressure Monitor Misc ?For regular home bp monitoring during pregnancy ?  ?buprenorphine 2 MG Subl SL tablet ?Commonly known as: SUBUTEX ?Place 8 mg under the tongue daily. Takes 2.5 tabs daily ?  ?buprenorphine 8 MG Subl SL tablet ?Commonly known as: SUBUTEX ?Place 24 mg under the tongue daily as needed for withdrawal. ?  ?ferrous sulfate 325 (65 FE) MG EC tablet ?Take by mouth. ?  ?gabapentin 600 MG tablet ?Commonly known as: NEURONTIN ?Take 600 mg by mouth 3 (three) times daily. ?  ?ibuprofen 600 MG tablet ?Commonly known as: ADVIL ?Take 1 tablet (600 mg total) by mouth every 6 (six) hours as needed. ?  ?M-Natal Plus 27-1 MG Tabs ?Take 1 tablet by mouth daily. ?  ?senna-docusate 8.6-50 MG tablet ?Commonly known as: Senokot-S ?Take 2 tablets by mouth daily. ?  ?sertraline 25 MG tablet ?Commonly known as: ZOLOFT ?Take 25 mg by mouth daily. ?  ?Ventolin HFA 108 (90 Base) MCG/ACT inhaler ?Generic drug: albuterol ?Inhale 2 puffs into the lungs every 4 (four) hours as needed for wheezing or shortness of breath. ?  ? ?  ? ? ? ?Discharge home in stable condition ?Infant Feeding: Breast ?Infant Disposition:home with mother when cleared  ?Discharge instruction: per After Visit Summary and Postpartum booklet. ?Activity: Advance as tolerated. Pelvic rest for 6 weeks.  ?Diet: routine diet ?Future Appointments: ?Future Appointments  ?Date Time Provider [place]  ?05/11/2022  3:15 PM Aletha Halim, MD [facility]  ?05/18/2022  8:20 AM [facility]  ?05/18/2022  9:55 AM Aletha Halim, MD [facility]  ? ?Follow up Visit: ? ?Message sent to [facility] by Dr Higinio Plan:  ?Please schedule this patient for a In person postpartum visit in 6 weeks with the following provider: Any provider. ?Additional Postpartum F/U:2 hour GTT and 2 week BTL appt with Ob/gyn MD   ?High risk pregnancy complicated by: GDM and subutex/benzo dependence, h/o SAH with severe pre-e ?Delivery mode:  Vaginal, Spontaneous  ?  Anticipated Birth Control:   plans interval BTL  ? ? ?04/09/2022 ?Patriciaann Clan, DO ? ? ? ?

## 2022-04-07 NOTE — Progress Notes (Signed)
Assessed patient at bedside due to increased pressure in her abdomen and bottom area. Cervical exam performed. Cervix positioned very anteriorly. SVE 2.5/70-80/-2 to -3 station. Will continue to titrate Pitocin. Unable to AROM this check due to position of cervix. Will reassess in 3-4 hours and re-attempt at that time. Cat 1 tracing. Baseline FHR low at 105 currently. Suspect patient's chronic Subutex use contributing to this. Continues to have moderate variability and 15 x 15 accels. Will monitor closely.  ? ?Vilma Meckel, MD ?

## 2022-04-08 DIAGNOSIS — Z8619 Personal history of other infectious and parasitic diseases: Secondary | ICD-10-CM

## 2022-04-08 LAB — CULTURE, BETA STREP (GROUP B ONLY)

## 2022-04-08 MED ORDER — RHO D IMMUNE GLOBULIN 1500 UNIT/2ML IJ SOSY
300.0000 ug | PREFILLED_SYRINGE | Freq: Once | INTRAMUSCULAR | Status: AC
  Administered 2022-04-08: 300 ug via INTRAVENOUS
  Filled 2022-04-08: qty 2

## 2022-04-08 MED ORDER — MEDROXYPROGESTERONE ACETATE 150 MG/ML IM SUSP: 150.0000 mg | Freq: Once | INTRAMUSCULAR | Status: DC

## 2022-04-08 NOTE — Progress Notes (Deleted)
Patient is a 31 yo g2p0 at 37+4,history lupus, here for iol for lupus. This is day 2 of her induction. She is complete. She practice pushed earlier this morning for about 20 minutes and has now been pushing regularly since 6:30 AM. I was called to evaluate for c/s. On my exam cervix is completely dilated and vertex is at plus 2 station. Maternal effort is good but push is weak. Tracing shows baseline of about 150 with moderate variability and variables with pushing, with quick recover. Overall fetal status is reassuring. We discussed risks and benefits of cesarean section. We also discussed risks and benefits of vacuum-assisted delivery. Extrapolated EFW is 8 pounds. I explained that although cesarean section is always available upon maternal request, at this time given progress, station, and relatively reassuring fetal status, I advise continued pushing, and patient and family are in agreement. Should expeditious delivery prove necessary, fetal station would allow us to attempt a vacuum-assisted delivery.  ?

## 2022-04-08 NOTE — Progress Notes (Signed)
POSTPARTUM PROGRESS NOTE ? ?Post Partum Day 1 ? ?Subjective: ? ?Dawn Davies is a 31 y.o. S0F0932 s/p VASVD at [redacted]w[redacted]d.  No acute events overnight.  Pt denies problems with ambulating, voiding or po intake.  She denies nausea or vomiting.  Pain is well controlled.  She has had flatus. She has had bowel movement.  Lochia Minimal. Abdominal pain improved after simethicone and additional subutex 4mg  dose.   ? ?Objective: ?Blood pressure 108/76, pulse 80, temperature 98.2 ?F (36.8 ?C), temperature source Oral, resp. rate 18, height 5\' 8"  (1.727 m), weight 90.7 kg, SpO2 100 %, unknown if currently breastfeeding. ? ?Physical Exam:  ?General: alert, cooperative and no distress ?Chest: no respiratory distress ?Heart:regular rate, distal pulses intact ?Abdomen: soft, nontender,  ?Uterine Fundus: firm, appropriately tender ?DVT Evaluation: No calf swelling or tenderness ?Extremities: no edema ?Skin: warm, dry; ? ?Recent Labs  ?  04/06/22 ?1631  ?HGB 12.2  ?HCT 35.2*  ? ? ?Assessment/Plan: ?Dawn Davies is a 31 y.o. [phone] s/p VASVD at [redacted]w[redacted]d  ? ?PPD#1 - Doing well ?Contraception: Interval BTL ?Feeding: Breast ?Dispo: Plan for discharge PPD#2. ? ?#A2GDM ?-- sugars well controlled, continue to monitor  ? ?#Opioid dependence on Subutex ?-- pain better controlled today after additional 4mg  subutex dose ? ? LOS: 2 days  ? ?[id number], DO, PGY-1 ?04/08/2022, 7:36 AM   ?

## 2022-04-08 NOTE — Social Work (Signed)
CSW received a call from [place] to inform CSW that [name] [age]-year-old child was removed from her custody by CPS, and she is making a report to [place]. CSW informed the [facility] Advocate Supervisor that a report will be filed with [facility] CPS.  ? ?Kathrin Greathouse, MSW, LCSW ?[facility] [place]  ?Clinical Social Worker  ?[phone] ?04/08/2022   ?
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
## 2022-04-08 NOTE — Lactation Note (Signed)
This note was copied from a baby's chart. ?Lactation Consultation Note ? ?Patient Name: Dawn Davies ?Today's Date: 04/08/2022 ?Reason for consult: Follow-up assessment;1st time breastfeeding;Term ?Age:32 hours ?Per mom, infant BF for 2 hours one feeding. ?LC suggested mom not BF infant for 2 hours to reserve infant's energy.  ?LC discussed Breast stimulation, not let infant sleep at the breast and not actively BF. ?Mom latched infant on her left breast using cradle hold position and did breast stimulation techniques keep infant actively feeding.   ?Infant BF for 16 minutes, sustained latch and BF with depth, mom is working lower infant's jaw, infant has tendency keep not open his mouth wide,  after latching infant,  mom hand expressed 10 mls of EBM that was spoon feed to infant.  ?Per dad, he went to gift shop looking at hand pump, LC gave mom hand pump for prn use when showing how to use mom was easily expressing colostrum that LC finger feed to infant. ?Mom knows infant is currently cluster feeding, she BF on demand, 8 to 12+ or more times within 24 hours  and mom  knows she can hand express after infant latches  if infant BF 30 -40 minutes and do skin to skin. ? ?Maternal Data ?  ? ?Feeding ?Mother's Current Feeding Choice: Breast Milk ? ?LATCH Score ?Latch: Grasps breast easily, tongue down, lips flanged, rhythmical sucking. ? ?Audible Swallowing: Spontaneous and intermittent ? ?Type of Nipple: Everted at rest and after stimulation ? ?Comfort (Breast/Nipple): Filling, red/small blisters or bruises, mild/mod discomfort ? ?Hold (Positioning): No assistance needed to correctly position infant at breast. (LC only extended infant's lower jaw for a deeper latch.) ? ?LATCH Score: 9 ? ? ?Lactation Tools Discussed/Used ?Tools: Pump ?Breast pump type: Manual ?Pump Education: Setup, frequency, and cleaning;Milk Storage ?Reason for Pumping: prn use ?Pumping frequency: prn ? ?Interventions ?Interventions: Skin to  skin;Expressed milk;Hand express;Education;Hand pump ? ?Discharge ?  ? ?Consult Status ?Consult Status: Follow-up ?Date: 04/09/22 ?Follow-up type: In-patient ? ? ? ?[name] ?04/08/2022, 6:29 PM ? ? ? ?

## 2022-04-09 ENCOUNTER — Ambulatory Visit: Payer: Self-pay

## 2022-04-09 ENCOUNTER — Other Ambulatory Visit (HOSPITAL_COMMUNITY): Payer: Self-pay

## 2022-04-09 ENCOUNTER — Encounter: Payer: Self-pay | Admitting: Family Medicine

## 2022-04-09 DIAGNOSIS — O99820 Streptococcus B carrier state complicating pregnancy: Secondary | ICD-10-CM | POA: Insufficient documentation

## 2022-04-09 LAB — SURGICAL PATHOLOGY

## 2022-04-09 LAB — RH IG WORKUP (INCLUDES ABO/RH)
Fetal Screen: NEGATIVE
Gestational Age(Wks): 39.1
Unit division: 0

## 2022-04-09 LAB — GLUCOSE, RANDOM: Glucose, Bld: 75 mg/dL (ref 70–99)

## 2022-04-09 MED ORDER — IBUPROFEN 600 MG PO TABS
600.0000 mg | ORAL_TABLET | Freq: Four times a day (QID) | ORAL | 0 refills | Status: DC | PRN
  Filled 2022-04-09: qty 60, 15d supply, fill #0

## 2022-04-09 NOTE — Social Work (Signed)
Per [facility] intake, the assigned CPS social worker is Ernestene Kiel [phone]. CSW reached out to the social worker and left a confidential voicemail with CSW contact information. ? ?Barriers to discharge.  ? ?Vivi Barrack, MSW, LCSW ?[facility]  ?Clinical Social Worker  ?[phone] ?04/09/2022  9:14 AM  ?

## 2022-04-09 NOTE — Social Work (Addendum)
Per CPS social worker Ernestene Kiel an Emergency CFT meeting will be held at 2:00 pm today. [name] and FOB made aware.  ? ?Update: Per CPS social worker Ernestene Kiel the CFT was canceled. The infant will discharge home with [name] and FOB.  ?There are No Barriers to discharge.  ? ?Vivi Barrack, MSW, LCSW ?[facility]  ?Clinical Social Worker  ?[phone] ?04/09/2022  1:06 PM  ?

## 2022-04-09 NOTE — Lactation Note (Signed)
This note was copied from a baby's chart. ?Lactation Consultation Note ? ?Patient Name: Dawn Davies ?Today's Date: 04/09/2022 ?Reason for consult: Follow-up assessment;1st time breastfeeding;Term (ESC, Infant with -8% weight loss, infant is being supplemented with EBM and 20 kcal Similac Pro Total Comfort.) ?Age:32 hours ?Per mom, infant recently BF for 30 minutes prior to [facility] entering the room, mom did not supplement with this feeding,  infant still cuing LC suggested mom to supplement  with every feeding due infant having weight loss of -8%. ?Mom was giving infant 30 mls of Similac Pro total comfort using white Nfant nipple and pace feeding infant while LC was in the room. ?Per mom, she is still pumping ever 3 hours for 15 minutes and she was told her breast milk can be mixed for higher calorie formula to give infant, mom is saving pumped milk in Fridge. ?LC suggested mom to give infant her EBM she pumped because milk lab is closed  tonight and the medications in mom's milk will help infant.  ?Mom's plan: ?1- Mom will continue to latch infant at the breast, limit feedings  at breast to 20 minutes and then supplement infant with her EBM first that she pump and then offer formula. ?2- Mom understands that at 60 hours of life infant should be supplemented with 30 mls + per feeding based to help stabilize  infant's weight.  ?3- Mom knows to call RN/ LC if she has any questions or concerns, ?Maternal Data ?  ? ?Feeding ?Mother's Current Feeding Choice: Breast Milk and Formula ?Nipple Type: Nfant Standard Flow (white) ? ?LATCH Score ?  ? ?  ? ?  ? ?  ? ?  ? ?  ? ? ?Lactation Tools Discussed/Used ?  ? ?Interventions ?Interventions: Skin to skin;Education ? ?Discharge ?  ? ?Consult Status ?  ? ? ? ?Vicente Serene ?04/09/2022, 10:31 PM ? ? ? ?

## 2022-04-09 NOTE — Social Work (Signed)
CSW escorted CSW CPS social worker Ernestene Kiel) to [facility] room to complete an assessment.  ? ?Vivi Barrack, MSW, LCSW ?[facility]  ?Clinical Social Worker  ?[phone] ?04/09/2022  11:28 AM  ?

## 2022-04-10 ENCOUNTER — Other Ambulatory Visit (HOSPITAL_COMMUNITY): Payer: Self-pay

## 2022-04-10 ENCOUNTER — Ambulatory Visit: Payer: Self-pay

## 2022-04-10 NOTE — Lactation Note (Signed)
This note was copied from a baby's chart. ?Lactation Consultation Note ? ?Patient Name: Dawn Davies ?Today's Date: 04/10/2022 ?Reason for consult: Follow-up assessment;Infant weight loss;Breastfeeding assistance (10% WL) ?Age:32 hours ? ?P3, Term, Female Infant ? ?Mom was latching baby on left breast as LC entered the room. Mom states that breastfeeding is going well. She says that she has some soreness when baby latches, but it gets better during the feeding. Baby was supplemented prior to [facility] entering the room and mom latched baby for 15 min. Baby still cueing after being fed. Mom states that she keeps feedings under 30 min (including supplementing) per the guidelines given to her by speech. Baby attempted to switch to the right side. LC assisted mom with hand expressing to soften the tissue so that baby could latch.  ? ?LC noted a scab on mom's right nipple. Mom stated that she had some knots in her breast. LC assessed mom's breast and did note some fullness. LC encouraged mom to pump after baby has finished the feeding since it has been almost 3 hours since she last pumped.  ? ?Mom will continue to follow the guidelines given by speech.  ?Latch baby after supplementing and keep feedings under 30 min including supplementing and latching the infant.  ?Mom will continue to pump every 3 hours.  ? ?Mom states that she will call for assistance if needed. ? ?Maternal Data ?  ? ?Feeding ?Nipple Type: Nfant Extra Slow Flow (gold) ? ?LATCH Score ?Latch: Grasps breast easily, tongue down, lips flanged, rhythmical sucking. ? ?Audible Swallowing: Spontaneous and intermittent ? ?Type of Nipple: Everted at rest and after stimulation ? ?Comfort (Breast/Nipple): Filling, red/small blisters or bruises, mild/mod discomfort ? ?Hold (Positioning): Assistance needed to correctly position infant at breast and maintain latch. (Mom's breasts are full. LC assisted with hand expression and reverse pressure to soften the tissue so that  baby could latch.) ? ?LATCH Score: 8 ? ? ?Lactation Tools Discussed/Used ?  ? ?Interventions ?Interventions: Assisted with latch;Hand express;Reverse pressure;Education ? ?Discharge ?  ? ?Consult Status ?Consult Status: Follow-up ?Date: 04/11/22 ?Follow-up type: In-patient ? ? ? ?Jeorgia Helming P [place], IBCLC ?04/10/2022, 5:45 PM ? ? ? ?

## 2022-04-11 ENCOUNTER — Ambulatory Visit: Payer: Self-pay

## 2022-04-11 NOTE — Lactation Note (Signed)
This note was copied from a baby's chart. ?Lactation Consultation Note ? ?Patient Name: Dawn Davies ?Today's Date: 04/11/2022 ?Reason for consult: Engorgement ?Age:32 days ? ?Follow up per nurse request due to engorgement. Mother is severely engorged. Mother states she is pumping ~5-6h and collecting ~90 mL with initiation setting. LC noted firm, warm, engorged breasts. Mother explains they are very uncomfortable. ? ?Ice bags to breast for 15 minutes and demonstrated light massage using coconut oil. Per mother, breasts/nipples feel better. LC demonstrated maintenance setting, provided hands-free top. Mother was able to collect 100 mL of EBM combined.  ? ?Discussed that pumping session time will always vary and it will give her stimulation similar to infant feeding from breast. Mother verbalizes in agreement.  ? ?Encouraged to request [facility] for any needs, support or questions. Praised mother for her milk supply, effort and dedication.  ? ?Plan: ?1-Apply ice to breast for 15 minutes at a time at least 30 minutes prior to pumping.  ?2-Massage breasts using coconut oil prior to pumping. ?3-Pump 8-12 in 24 h following maintenance setting. ?4-Only use heat while actively pumping.  ? ?All questions answered at this time.  ? ? ?Maternal Data ?Does the patient have breastfeeding experience prior to this delivery?: No ? ?Feeding ?Mother's Current Feeding Choice: Breast Milk and Formula ?Nipple Type: Dr. Myra Gianotti Preemie ? ?Lactation Tools Discussed/Used ?Tools: Pump;Flanges;Coconut oil;Hands-free pumping top ?Flange Size: 24 ?Breast pump type: Double-Electric Breast Pump;Manual ?Reason for Pumping: stimulation and supplementation ?Pumping frequency: at least 8 times a day ?Pumped volume: 100 mL ? ?Interventions ?Interventions: Skin to skin;Breast massage;Expressed milk;Coconut oil;Reverse pressure;DEBP;Ice;Education;Pace feeding ? ?Discharge ?Discharge Education: Engorgement and breast care ?Pump: DEBP;Manual ? ?Consult  Status ?Consult Status: Follow-up ?Date: 04/12/22 ?Follow-up type: In-patient ? ? ? ?Brownie Nehme A Higuera Ancidey ?04/11/2022, 1:50 PM ? ? ? ?

## 2022-04-11 NOTE — Lactation Note (Signed)
This note was copied from a baby's chart. ?Lactation Consultation Note ? ?Patient Name: Dawn Davies ?Today's Date: 04/11/2022 ?Age:31 days ? ?[place] attempted visit, per nurse request, due to engorgement. Mother and baby are sleeping. Notified RN. LC will come back at a later time.   ? ?[name] ?04/11/2022, 9:00 AM ? ? ? ?

## 2022-04-13 ENCOUNTER — Other Ambulatory Visit (HOSPITAL_COMMUNITY): Payer: Self-pay

## 2022-04-15 ENCOUNTER — Telehealth (HOSPITAL_COMMUNITY): Payer: Self-pay | Admitting: *Deleted

## 2022-04-15 ENCOUNTER — Other Ambulatory Visit (HOSPITAL_COMMUNITY): Payer: Self-pay

## 2022-04-15 DIAGNOSIS — Z1331 Encounter for screening for depression: Secondary | ICD-10-CM

## 2022-04-15 NOTE — Telephone Encounter (Signed)
Attempted hospital discharge follow-up call. No answer received. EPDS in hospital on 04/08/22 was 11. Referral sent to Dr. Shonna Chock, MD,  for Integrated Behavioral Health follow-up. Deforest Hoyles, RN, 04/15/22, 1610 ?

## 2022-04-21 NOTE — BH Specialist Note (Signed)
Integrated Behavioral Health via Telemedicine Visit  04/30/2022 Dawn Davies [id number]  Number of Integrated Behavioral Health Clinician visits: 1- Initial Visit  Session Start time: [phone]   Session End time: 0950  Total time in minutes: 34   Referring Provider: Shonna Chock, MD Patient/Family location: Home [facility] Provider location: Center for [facility]  All persons participating in visit: Patient Dawn Davies and [facility] Dawn Davies   Types of Service: Individual psychotherapy and Video visit  I connected with Dawn Davies and/or Dawn Davies  n/a  via  Telephone or Video Enabled Telemedicine Application  (Video is Caregility application) and verified that I am speaking with the correct person using two identifiers. Discussed confidentiality: Yes   I discussed the limitations of telemedicine and the availability of in person appointments.  Discussed there is a possibility of technology failure and discussed alternative modes of communication if that failure occurs.  I discussed that engaging in this telemedicine visit, they consent to the provision of behavioral healthcare and the services will be billed under their insurance.  Patient and/or legal guardian expressed understanding and consented to Telemedicine visit: Yes   Presenting Concerns: Patient and/or family reports the following symptoms/concerns: Fatigue, difficulty staying asleep, worry about baby in NICU (with no known discharge date); pt states anxiety attacks have not increased postpartum, but have remained the same; started back taking Celexa/citalapram 40mg  yesterday.  Duration of problem: Ongoing; Severity of problem: moderate  Patient and/or Family's Strengths/Protective Factors: Social connections, Concrete supports in place (healthy food, safe environments, etc.), and Sense of purpose  Goals Addressed: Patient will:  Maintain reduction of  symptoms of: anxiety    Demonstrate ability to: Increase motivation to adhere to plan of care  Progress towards Goals: Ongoing  Interventions: Interventions utilized:  Psychoeducation and/or Health Education, Link to , and Supportive Reflection Standardized Assessments completed: GAD-7 and PHQ 9  Patient and/or Family Response: Patient agrees with treatment plan.   Assessment: Patient currently experiencing Generalized anxiety disorder and Opioid use disorder, moderate, on maintenance therapy (both as previously diagnosed)   Patient may benefit from psychoeducation and brief therapeutic interventions regarding coping with symptoms of anxiety and adjusting to current life stressors .  Plan: Follow up with behavioral health clinician on : Two weeks Behavioral recommendations:  -Continue taking BH medications as prescribed (Celexa, Subutex, Xanax); discuss any changes with PCP on 05/13/2022 -May continue taking prenatal vitamin until 6 week postpartum check up on 05/18/2022; discuss with ob/gyn on 05/11/2022 if any concern -Accept referral to [facility] for [facility] -Prioritize healthy self-care daily for the next two weeks; allow friends/family to offer practical support as needed Referral(s): Integrated [facility] (In Clinic) and Community Resources:  [facility]  I discussed the assessment and treatment plan with the patient and/or parent/guardian. They were provided an opportunity to ask questions and all were answered. They agreed with the plan and demonstrated an understanding of the instructions.   They were advised to call back or seek an in-person evaluation if the symptoms worsen or if the condition fails to improve as anticipated.  [facility], LCSW     04/08/2022   10:30 AM 04/04/2020    1:58 PM 03/02/2020    3:49 PM  Edinburgh Postnatal Depression Scale Screening Tool  I have been able to laugh and see the funny side of things. 0 0 0  I have looked forward with  enjoyment to things. 1 1 0  I have  blamed myself unnecessarily when things went wrong. 1 2 0  I have been anxious or worried for no good reason. 2 3 2   I have felt scared or panicky for no good reason. 2 3 0  Things have been getting on top of me. 1 1 0  I have been so unhappy that I have had difficulty sleeping. 1 2 0  I have felt sad or miserable. 1 2 0  I have been so unhappy that I have been crying. 2 2 0  The thought of harming myself has occurred to me. 0 0 0  Edinburgh Postnatal Depression Scale Total 11 16 2        04/30/2022    9:22 AM 08/22/2019   10:54 AM 07/12/2019    4:29 PM  Depression screen PHQ 2/9  Decreased Interest 0 0 0  Down, Depressed, Hopeless 0 0 0  PHQ - 2 Score 0 0 0  Altered sleeping 2    Tired, decreased energy 3    Change in appetite 0    Feeling bad or failure about yourself  0    Trouble concentrating 0    Moving slowly or fidgety/restless 0    Suicidal thoughts 0    PHQ-9 Score 5        04/30/2022    9:25 AM  GAD 7 : Generalized Anxiety Score  Nervous, Anxious, on Edge 1  Control/stop worrying 0  Worry too much - different things 0  Trouble relaxing 3  Restless 1  Easily annoyed or irritable 0  Afraid - awful might happen 0  Total GAD 7 Score 5

## 2022-04-29 ENCOUNTER — Ambulatory Visit: Payer: Self-pay

## 2022-04-29 NOTE — Lactation Note (Signed)
This note was copied from a baby's chart. Lactation Consultation Note  Patient Name: Dawn Davies [id number] Date: 04/29/2022 Reason for consult: Follow-up assessment;NICU baby;Term;Other (Comment) (Mom is Hep C (+)) Age:32 wk.o.  Visited with mom of 47 weeks old FT NICU female, she reported she stopped pumping weeks ago shortly after the episode of engorgement. She endorses breast are no longer engorged and that they feel soft now, no longer leaking. She was a transfer from the [facility], last seen on 04/11/22 by [facility] LC. Baby is on Similac 27 calorie formula on ad lib status. Lactation services are completed at this point, Ms. Kreuser to contact [facility] services PRN.  Feeding Mother's Current Feeding Choice: Formula Nipple Type: Dr. Myra Gianotti Preemie  Interventions Interventions: Education  Consult Status Consult Status: Complete Date: 04/29/22 Follow-up type: Call as needed   [name] Francene Boyers 04/29/2022, 5:21 PM

## 2022-04-30 ENCOUNTER — Ambulatory Visit (INDEPENDENT_AMBULATORY_CARE_PROVIDER_SITE_OTHER): Payer: Medicaid Other | Admitting: Clinical

## 2022-04-30 DIAGNOSIS — F411 Generalized anxiety disorder: Secondary | ICD-10-CM | POA: Diagnosis not present

## 2022-04-30 DIAGNOSIS — F1120 Opioid dependence, uncomplicated: Secondary | ICD-10-CM

## 2022-04-30 NOTE — Patient Instructions (Signed)
[facility] [address] [place], [place] [address] [phone] (main office) [phone] (Zonya Gudger's office)  New Parent Support Groups [email or web address]   

## 2022-05-01 ENCOUNTER — Inpatient Hospital Stay (HOSPITAL_COMMUNITY)
Admission: AD | Admit: 2022-05-01 | Discharge: 2022-05-01 | Disposition: A | Discharge status: Home / Self Care | Payer: Medicaid Other | Attending: Obstetrics and Gynecology | Admitting: Obstetrics and Gynecology

## 2022-05-01 DIAGNOSIS — O9089 Other complications of the puerperium, not elsewhere classified: Secondary | ICD-10-CM | POA: Insufficient documentation

## 2022-05-01 DIAGNOSIS — R143 Flatulence: Secondary | ICD-10-CM | POA: Diagnosis not present

## 2022-05-01 DIAGNOSIS — G4700 Insomnia, unspecified: Secondary | ICD-10-CM

## 2022-05-01 MED ORDER — MELATONIN 5 MG PO CHEW
5.0000 mg | CHEWABLE_TABLET | Freq: Every evening | ORAL | 0 refills | Status: AC | PRN
Start: 2022-05-01 — End: 2022-05-15

## 2022-05-01 NOTE — MAU Provider Note (Signed)
History     CSN: [id number]  Arrival date and time: 05/01/22 0950   Event Date/Time   First Provider Initiated Contact with Patient 05/01/22 1013      Chief Complaint  Patient presents with   Insomnia   Anxiety   HPI Dawn Davies is a 31 y.o. G3P3003 postpartum patient who presents to MAU for evaluation of insomnia. She is s/p VAVD on 04/07/2022. Her baby is in the NICU. She states she can't get more than 1-2 hours of sleep and has difficulty "turning my thoughts off". She denies concerns related to her physical recovery. She is spending the majority of her time with her baby and is typically not spending much time at home resting.  She denies SI, HI, IPV.  OB History     Gravida  3   Para  3   Term  3   Preterm      AB      Living  3      SAB      IAB      Ectopic      Multiple  0   Live Births  3           Past Medical History:  Diagnosis Date   Anxiety    Reversible cerebrovascular vasoconstriction syndrome 2013   2 weeks postpartum    Past Surgical History:  Procedure Laterality Date   MULTIPLE TOOTH EXTRACTIONS      Family History  Problem Relation Age of Onset   Hypertension Mother    COPD Mother    Juvenile idiopathic arthritis Daughter    Diabetes Paternal Grandmother     Social History   Tobacco Use   Smoking status: Every Day    Packs/day: 1.00    Types: Cigarettes   Smokeless tobacco: Never  Vaping Use   Vaping Use: Never used  Substance Use Topics   Alcohol use: Not Currently   Drug use: Not Currently    Allergies:  Allergies  Allergen Reactions   Morphine Anaphylaxis and Rash   Hydrocodone-Acetaminophen Itching and Other (See Comments)    Reaction not specified by patient Reaction not specified by pat    Naloxone Nausea And Vomiting   Varenicline Other (See Comments)    Xyzal-Headaches    No medications prior to admission.    Review of Systems  Constitutional:        Difficulty falling and staying  asleep  All other systems reviewed and are negative. Physical Exam   Blood pressure 128/82, pulse (!) 101, temperature 98.8 F (37.1 C), temperature source Oral, resp. rate 17, SpO2 100 %, not currently breastfeeding.  Physical Exam Vitals and nursing note reviewed. Exam conducted with a chaperone present.  Constitutional:      Appearance: Normal appearance. She is not ill-appearing.  Cardiovascular:     Rate and Rhythm: Normal rate and regular rhythm.     Pulses: Normal pulses.     Heart sounds: Normal heart sounds.  Pulmonary:     Effort: Pulmonary effort is normal.     Breath sounds: Normal breath sounds.  Skin:    Capillary Refill: Capillary refill takes less than 2 seconds.  Neurological:     Mental Status: She is alert and oriented to person, place, and time.  Psychiatric:        Mood and Affect: Mood normal.        Behavior: Behavior normal.        Thought Content: Thought content normal.  Judgment: Judgment normal.    MAU Course  Procedures  MDM  --No physical complaints or concerns --Insomnia 2/2 stress of recent birth, baby in NICU. No SI, HI, or IPV. S/p virtual meeting with 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 Patient agreeable to meeting with Dr. Crissie Reese in addition to new Gyn/postpartum appointment. Appointment coordinated with [facility] front desk and Dr. Crissie Reese  Patient Vitals for the past 24 hrs:  BP Temp Temp src Pulse Resp SpO2  05/01/22 1011 128/82 98.8 F (37.1 C) Oral (!) 101 17 100 %   Meds ordered this encounter  Medications   Melatonin 5 MG CHEW    Sig: Chew 5 mg by mouth at bedtime as needed for up to 14 days.    Dispense:  14 tablet    Refill:  0    Order Specific Question:   Supervising Provider    Answer:   Hermina Staggers [[id number]]   Assessment and Plan  --31 y.o. G3P3003 s/p VAVD on 04/07/22 --Insomnia 2/2 stress --No acute mental health  concerns --Trial Melatonin per patient preference --Discharge home in stable condition  F/U: Patient has office appointments as follows: --Dr. Vergie Living 05/11/22 BTL follow-up/New GYN --[facility] 05/14/22 --Postpartum visit 05/18/22 --New patient Dr. Crissie Reese 06/02/22   Calvert Cantor, CNM 05/01/2022, 7:03 PM

## 2022-05-01 NOTE — MAU Note (Addendum)
Dawn Davies is a 31 y.o. 3 wks PP.  here in MAU reporting: delivered vag 3 wks ago, baby is in the NICU. Baby is doing good, off the feeding tube.  hasn't had much sleep since then.  Having problems with anxiety. Bleeding has almost stopped. Has really bad gas pains.   Onset of complaint: increasing Pain score: 3 Vitals:   05/01/22 1011  BP: 128/82  Pulse: (!) 101  Resp: 17  Temp: 98.8 F (37.1 C)  SpO2: 100%     Lab orders placed from triage:  none  Denies depression, "just can't sleep due to anxiety".

## 2022-05-01 NOTE — BH Specialist Note (Signed)
error 

## 2022-05-11 ENCOUNTER — Encounter: Payer: Medicaid Other | Admitting: Obstetrics and Gynecology

## 2022-05-13 ENCOUNTER — Other Ambulatory Visit: Payer: Self-pay | Admitting: *Deleted

## 2022-05-13 DIAGNOSIS — O24429 Gestational diabetes mellitus in childbirth, unspecified control: Secondary | ICD-10-CM

## 2022-05-18 ENCOUNTER — Other Ambulatory Visit: Payer: Medicaid Other

## 2022-05-18 ENCOUNTER — Ambulatory Visit: Payer: Medicaid Other | Admitting: Obstetrics and Gynecology

## 2022-05-20 NOTE — BH Specialist Note (Unsigned)
Integrated Behavioral Health via Telemedicine Visit  05/20/2022 Dawn Davies [id number]  Number of [place] Clinician visits: 1- Initial Visit  Session Start time: [phone]   Session End time: 0950  Total time in minutes: 34   Referring Provider: *** Patient/Family location: [facility] Provider location: *** All persons participating in visit: *** Types of Service: {CHL AMB TYPE OF SERVICE:[id number]}  I connected with Dawn Davies and/or Dawn Davies's {family members:20773} via  Telephone or Video Enabled Telemedicine Application  (Video is Caregility application) and verified that I am speaking with the correct person using two identifiers. Discussed confidentiality: {YES/NO:21197}  I discussed the limitations of telemedicine and the availability of in person appointments.  Discussed there is a possibility of technology failure and discussed alternative modes of communication if that failure occurs.  I discussed that engaging in this telemedicine visit, they consent to the provision of behavioral healthcare and the services will be billed under their insurance.  Patient and/or legal guardian expressed understanding and consented to Telemedicine visit: {YES/NO:21197}  Presenting Concerns: Patient and/or family reports the following symptoms/concerns: *** Duration of problem: ***; Severity of problem: {Mild/Moderate/Severe:20260}  Patient and/or Family's Strengths/Protective Factors: {CHL AMB BH PROTECTIVE [phone]}  Goals Addressed: Patient will:  Reduce symptoms of: {IBH Symptoms:21014056}   Increase knowledge and/or ability of: {IBH Patient Tools:21014057}   Demonstrate ability to: {IBH Goals:21014053}  Progress towards Goals: {CHL AMB BH PROGRESS TOWARDS GOALS:[phone]}  Interventions: Interventions utilized:  {IBH Interventions:21014054} Standardized Assessments completed: {IBH Screening Tools:21014051}  Patient and/or Family  Response: ***  Assessment: Patient currently experiencing ***.   Patient may benefit from ***.  Plan: Follow up with behavioral health clinician on : *** Behavioral recommendations: *** Referral(s): {IBH Referrals:21014055}  I discussed the assessment and treatment plan with the patient and/or parent/guardian. They were provided an opportunity to ask questions and all were answered. They agreed with the plan and demonstrated an understanding of the instructions.   They were advised to call back or seek an in-person evaluation if the symptoms worsen or if the condition fails to improve as anticipated.  Dawn Davies Dawn Diss, LCSW

## 2022-06-01 NOTE — Progress Notes (Signed)
Virtual Visit via Video Note  I connected with Dawn Davies on 06/03/22 at  1:15 PM EDT by a video enabled telemedicine application and verified that I am speaking with the correct person using two identifiers.  Location: Patient: home Provider: [facility]   I discussed the limitations of evaluation and management by telemedicine and the availability of in person appointments. The patient expressed understanding and agreed to proceed.       Virtual Post Partum Visit Note  Dawn Davies is a 31 y.o. G75P3003 female who presents for a postpartum visit. She is 8 weeks postpartum following a vacuum-assisted vaginal delivery.  I have fully reviewed the prenatal and intrapartum course. The delivery was at 39.1 gestational weeks.  Anesthesia: epidural. Postpartum course has been difficult due to her depression. Baby is doing well. Baby is feeding by breast. Bleeding no bleeding. Bowel function is normal. Bladder function is normal. Patient is sexually active. Contraception method is none. Postpartum depression screening: positive.  During visit patient appears intoxicated. 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 She reports she is taking them more often than prescribed.   The pregnancy intention screening data noted above was reviewed. Potential methods of contraception were discussed. The patient elected to proceed with No data recorded.   Edinburgh Postnatal Depression Scale - 06/02/22 1342       Edinburgh Postnatal Depression Scale:  In the Past 7 Days   I have been able to laugh and see the funny side of things. 1    I have looked forward with enjoyment to things. 0    I have blamed myself unnecessarily when things went wrong. 2    I have been anxious or worried for no good reason. 3    I have felt scared or panicky for no good reason. 3    Things have been getting on top of me. 2    I have been so unhappy that I have had difficulty sleeping.  2    I have felt sad or miserable. 2    I have been so unhappy that I have been crying. 0    The thought of harming myself has occurred to me. 0    Edinburgh Postnatal Depression Scale Total 15             Health Maintenance Due  Topic Date Due   COVID-19 Vaccine (1) Never done    The following portions of the patient's history were reviewed and updated as appropriate: allergies, current medications, past family history, past medical history, past social history, past surgical history, and problem list.  Review of Systems Pertinent items noted in HPI and remainder of comprehensive ROS otherwise negative.  Objective:  There were no vitals taken for this visit.   General:  Appears intoxicated, speech is slurred  Lungs: Comfortable on room air    Assessment:    There are no diagnoses linked to this encounter.  Abnormal postpartum exam.   Plan:   Essential components of care per ACOG recommendations:  1.  Mood and well being: Patient with positive depression screening today. Reviewed local resources for support.  - Patient tobacco use? Yes. Patient desires to quit? No.   - hx of drug use? Yes. Discussed support systems and outpatient/inpatient treatment options.   - long discussion with patient. She appeared intoxicated during our visit. She admitted to using benzo's beyond what she is prescribed as well as more frequently. She desires to  go to rehab for this issue, unfortunately this is difficult given the substance in question is a benzo. I reached out to a colleague who provided two possibilities and I will message her with these options. She also gave me verbal permission to message her prescriber Dr. Salvadore Farber and let him know what is going on as well as her desires to undergo detox from benzos. I told patient a hospital admission may be her only option.   2. Infant care and feeding:  -Patient currently breastmilk feeding? No.  -Social determinants of health (SDOH)  reviewed in EPIC.   3. Sexuality, contraception and birth spacing - Patient does not want a pregnancy in the next year.  Desired family size is 3 children.  - Reviewed reproductive life planning. Reviewed contraceptive methods based on pt preferences and effectiveness.  Patient desired Depo, scheduled to come in to clinic in two days for that     4. Sleep and fatigue -Encouraged family/partner/community support of 4 hrs of uninterrupted sleep to help with mood and fatigue  5. Physical Recovery  - Discussed patients delivery and complications. She describes her labor as mixed. - Patient had a  VAVD . Patient had no laceration. Perineal healing reviewed. Patient expressed understanding - Patient has urinary incontinence? No. - Patient is safe to resume physical and sexual activity  6.  Health Maintenance - HM due items addressed Yes - Last pap smear  Diagnosis  Date Value Ref Range Status  08/22/2019   Final   - Negative for intraepithelial lesion or malignancy (NILM)   Pap smear not done at today's visit. Needs repeat in three months.  -Breast Cancer screening indicated? No.   7. Chronic Disease/Pregnancy Condition follow up: Gestational Diabetes - needs to be scheduled for 2hr GTT - PCP follow up   I discussed the assessment and treatment plan with the patient. The patient was provided an opportunity to ask questions and all were answered. The patient agreed with the plan and demonstrated an understanding of the instructions.   The patient was advised to call back or seek an in-person evaluation if the symptoms worsen or if the condition fails to improve as anticipated.  I provided 15 minutes of non-face-to-face time during this encounter.  Venora Maples, MD [facility], [facility]

## 2022-06-02 ENCOUNTER — Telehealth (INDEPENDENT_AMBULATORY_CARE_PROVIDER_SITE_OTHER): Payer: Medicaid Other | Admitting: Family Medicine

## 2022-06-02 ENCOUNTER — Encounter: Payer: Self-pay | Admitting: Family Medicine

## 2022-06-02 ENCOUNTER — Ambulatory Visit (INDEPENDENT_AMBULATORY_CARE_PROVIDER_SITE_OTHER): Payer: Medicaid Other | Admitting: Clinical

## 2022-06-02 DIAGNOSIS — O26899 Other specified pregnancy related conditions, unspecified trimester: Secondary | ICD-10-CM | POA: Diagnosis not present

## 2022-06-02 DIAGNOSIS — R87611 Atypical squamous cells cannot exclude high grade squamous intraepithelial lesion on cytologic smear of cervix (ASC-H): Secondary | ICD-10-CM

## 2022-06-02 DIAGNOSIS — O24419 Gestational diabetes mellitus in pregnancy, unspecified control: Secondary | ICD-10-CM

## 2022-06-02 DIAGNOSIS — G40909 Epilepsy, unspecified, not intractable, without status epilepticus: Secondary | ICD-10-CM

## 2022-06-02 DIAGNOSIS — E079 Disorder of thyroid, unspecified: Secondary | ICD-10-CM

## 2022-06-02 DIAGNOSIS — Z6791 Unspecified blood type, Rh negative: Secondary | ICD-10-CM

## 2022-06-02 DIAGNOSIS — F1120 Opioid dependence, uncomplicated: Secondary | ICD-10-CM

## 2022-06-02 DIAGNOSIS — Z8673 Personal history of transient ischemic attack (TIA), and cerebral infarction without residual deficits: Secondary | ICD-10-CM

## 2022-06-02 DIAGNOSIS — F411 Generalized anxiety disorder: Secondary | ICD-10-CM | POA: Diagnosis not present

## 2022-06-02 DIAGNOSIS — O99320 Drug use complicating pregnancy, unspecified trimester: Secondary | ICD-10-CM | POA: Diagnosis not present

## 2022-06-02 NOTE — Progress Notes (Signed)
1318: attempted to contact patient via phone to log onto virtual visit, no answer, VM left. Will re attempt to contact patient   Dawn Davies, [place]

## 2022-06-02 NOTE — BH Specialist Note (Deleted)
Integrated Behavioral Health via Telemedicine Visit  06/02/2022 Lakara Weiland [id number]  Number of Integrated Behavioral Health Clinician visits: 2- Second Visit  Session Start time: 1015   Session End time: 1041  Total time in minutes: 26   Referring Provider: *** Patient/Family location: [facility] Provider location: *** All persons participating in visit: *** Types of Service: {CHL AMB TYPE OF SERVICE:[phone]}  I connected with [name] and/or Morrie Sheldon Coscia's {family members:20773} via  Telephone or Video Enabled Telemedicine Application  (Video is Caregility application) and verified that I am speaking with the correct person using two identifiers. Discussed confidentiality: {YES/NO:21197}  I discussed the limitations of telemedicine and the availability of in person appointments.  Discussed there is a possibility of technology failure and discussed alternative modes of communication if that failure occurs.  I discussed that engaging in this telemedicine visit, they consent to the provision of behavioral healthcare and the services will be billed under their insurance.  Patient and/or legal guardian expressed understanding and consented to Telemedicine visit: {YES/NO:21197}  Presenting Concerns: Patient and/or family reports the following symptoms/concerns: *** Duration of problem: ***; Severity of problem: {Mild/Moderate/Severe:20260}  Patient and/or Family's Strengths/Protective Factors: {CHL AMB BH PROTECTIVE [phone]}  Goals Addressed: Patient will:  Reduce symptoms of: {IBH Symptoms:21014056}   Increase knowledge and/or ability of: {IBH Patient Tools:21014057}   Demonstrate ability to: {IBH Goals:21014053}  Progress towards Goals: {CHL AMB BH PROGRESS TOWARDS GOALS:[phone]}  Interventions: Interventions utilized:  {IBH Interventions:21014054} Standardized Assessments completed: {IBH Screening Tools:21014051}  Patient and/or Family  Response: ***  Assessment: Patient currently experiencing ***.   Patient may benefit from ***.  Plan: Follow up with behavioral health clinician on : *** Behavioral recommendations: *** Referral(s): {IBH Referrals:21014055}  I discussed the assessment and treatment plan with the patient and/or parent/guardian. They were provided an opportunity to ask questions and all were answered. They agreed with the plan and demonstrated an understanding of the instructions.   They were advised to call back or seek an in-person evaluation if the symptoms worsen or if the condition fails to improve as anticipated.  Valetta Close Lyndzie Zentz, LCSW

## 2022-06-03 ENCOUNTER — Encounter: Payer: Self-pay | Admitting: Family Medicine

## 2022-06-04 ENCOUNTER — Ambulatory Visit: Payer: Medicaid Other

## 2022-07-30 ENCOUNTER — Ambulatory Visit (HOSPITAL_COMMUNITY)
Admission: EM | Admit: 2022-07-30 | Discharge: 2022-07-30 | Disposition: A | Discharge status: Home / Self Care | Payer: Medicaid Other | Attending: Mental Health | Admitting: Mental Health

## 2022-07-30 DIAGNOSIS — F1310 Sedative, hypnotic or anxiolytic abuse, uncomplicated: Secondary | ICD-10-CM | POA: Insufficient documentation

## 2022-07-30 DIAGNOSIS — F411 Generalized anxiety disorder: Secondary | ICD-10-CM | POA: Diagnosis not present

## 2022-07-30 NOTE — BH Assessment (Signed)
Comprehensive Clinical Assessment (CCA) Note  07/30/2022 Dawn Davies [id number]  Disposition: Rockney Ghee, NP recommends discharge and to follow up with substance use inpatient resources. Resources provided.   Flowsheet Row ED from 07/30/2022 in [facility] Admission (Discharged) from 05/01/2022 in [facility] 1S Maternity Assessment Unit Admission (Discharged) from 04/06/2022 in [facility] 4S Mother Baby Unit  C-SSRS RISK CATEGORY No Risk No Risk No Risk      The patient demonstrates the following risk factors for suicide: Chronic risk factors for suicide include: psychiatric disorder of Major Depressive Disorder, and GAD . Acute risk factors for suicide include: N/A. Protective factors for this patient include: positive social support and Pt denies, SI and previous suicide attempts . Considering these factors, the overall suicide risk at this point appears to be no risk. Patient is not appropriate for outpatient follow up.  Dawn Davies is a 31 year old female who presents voluntary and unaccompanied to [facility]. Clinician asked the pt, "what brought you to the hospital?" Pt reports, she wants to detox from Xanax. Pt reports, feeling hot/cold, nauseous and having a headache. Pt reports, her last seizure was two years ago. Pt reports, she recently had a baby three months ago and wants off Benzo's. Pt reports, her three 57 old and oldest child lives in [place] with her mother; her middle child lives with paternal great aunt. Pt denies, SI, HI, AVH, self-injurious behaviors and access to weapons.    Pt reports, Dr. Delano Metz, PCP at [facility] prescribes her Xanax and Subutex. Pt reports, taking 5-8, 1mg  tablets daily, for about 10 years. Pt reports, she took her last tablet yesterday. Pt reports, takes Subutex three times daily. Per chart pt was prescribed Xanax this week and is currently out. Pt denies, previous inpatient psychiatric admissions and substance use  admission.   Pt presents quiet, awake with normal speech. Pt's mood wad euthymic. Pt's affect was flat. Pt's insight, judgement was fair. Pt reports, if discharged she can contract for safety.   Diagnosis: GAD  Chief Complaint:  Chief Complaint  Patient presents with   Addiction Problem   Visit Diagnosis:     CCA Screening, Triage and Referral (STR)  Patient Reported Information How did you hear about ? Self  What Is the Reason for Your Visit/Call Today? Patient presents to the [facility] requesting Xanax detox.  Patient states that she has been on Xanax for the past 10 years and states that she takes 5 mg to 8 mg daily. She is prescribed 3 mg daily.  Last use was yesterday.  Patient states that she is prescribed subutex 8 mg tid.  Patient states that she last took it this morning.  Patient states that she is currently experiencing withdrawal symptoms of hot flashes, cold chills, diarrhea.  Patient denies SI/HI/Psychosis. Patient is urgent, but not appropriate for a [facility] admission due to the amount of Xanax she is using and her desire to continue with her subutex.  How Long Has This Been Causing You Problems? > than 6 months  What Do You Feel Would Help You the Most Today? Alcohol or Drug Use Treatment   Have You Recently Had Any Thoughts About Hurting Yourself? No  Are You Planning to Commit Suicide/Harm Yourself At This time? No   Have you Recently Had Thoughts About Hurting Someone [facility]? No  Are You Planning to Harm Someone at This Time? No  Explanation: No data recorded  Have You Used Any Alcohol or Drugs in the  Past 24 Hours? Yes  How Long Ago Did You Use Drugs or Alcohol? No data recorded What Did You Use and How Much? 8 mg subutex, Xanax 1 mg last night   Do You Currently Have a Therapist/Psychiatrist? No data recorded Name of Therapist/Psychiatrist: No data recorded  Have You Been Recently Discharged From Any Office Practice or Programs? No data recorded Explanation  of Discharge From Practice/Program: No data recorded    CCA Screening Triage Referral Assessment Type of Contact: No data recorded Telemedicine Service Delivery:   Is this Initial or Reassessment? No data recorded Date Telepsych consult ordered in [facility]:  No data recorded Time Telepsych consult ordered in [facility]:  No data recorded Location of Assessment: No data recorded Provider Location: No data recorded  Collateral Involvement: No data recorded  Does Patient Have a Court Appointed Legal Guardian? No data recorded Name and Contact of Legal Guardian: No data recorded If Minor and Not Living with Parent(s), Who has Custody? No data recorded Is CPS involved or ever been involved? No data recorded Is APS involved or ever been involved? No data recorded  Patient Determined To Be At Risk for Harm To Self or Others Based on Review of Patient Reported Information or Presenting Complaint? No data recorded Method: No data recorded Availability of Means: No data recorded Intent: No data recorded Notification Required: No data recorded Additional Information for Danger to Others Potential: No data recorded Additional Comments for Danger to Others Potential: No data recorded Are There Guns or Other Weapons in Your Home? No data recorded Types of Guns/Weapons: No data recorded Are These Weapons Safely Secured?                            No data recorded Who Could Verify You Are Able To Have These Secured: No data recorded Do You Have any Outstanding Charges, Pending Court Dates, Parole/Probation? No data recorded Contacted To Inform of Risk of Harm To Self or Others: No data recorded   Does Patient Present under Involuntary Commitment? No data recorded IVC Papers Initial File Date: No data recorded  [place] of Residence: No data recorded  Patient Currently Receiving the Following Services: No data recorded  Determination of Need: Urgent (48 hours)   Options For Referral: Other: Comment  (Patient will most likely require hospital based detox services.)     CCA Biopsychosocial Patient Reported Schizophrenia/Schizoaffective Diagnosis in Past: No   Strengths: No data recorded  Mental Health Symptoms Depression:   Worthlessness; Hopelessness; Irritability; Difficulty Concentrating; Sleep (too much or little); Increase/decrease in appetite   Duration of Depressive symptoms:    Mania:   None   Anxiety:    Tension; Worrying; Restlessness; Irritability (Pt reports, she had a panic attack 2-3 days ago.)   Psychosis:   None   Duration of Psychotic symptoms:    Trauma:   None   Obsessions:   None   Compulsions:   None   Inattention:   None   Hyperactivity/Impulsivity:   Feeling of restlessness; Fidgets with hands/feet   Oppositional/Defiant Behaviors:   None   Emotional Irregularity:   None   Other Mood/Personality Symptoms:  No data recorded   Mental Status Exam Appearance and self-care  Stature:   Average   Weight:   Average weight   Clothing:   Casual   Grooming:   Normal   Cosmetic use:   None   Posture/gait:   Normal   Motor  activity:  No data recorded  Sensorium  Attention:   Normal   Concentration:   Normal   Orientation:   X5   Recall/memory:   Normal   Affect and Mood  Affect:   Flat   Mood:   Euthymic   Relating  Eye contact:   Normal   Facial expression:   Responsive   Attitude toward examiner:   Cooperative   Thought and Language  Speech flow:  Normal   Thought content:   Appropriate to Mood and Circumstances   Preoccupation:   None   Hallucinations:   None   Organization:  No data recorded  [employer] of Knowledge:   Fair   Intelligence:   Average   Abstraction:  No data recorded  Judgement:   Fair   Reality Testing:  No data recorded  Insight:   Fair   Decision Making:   Normal   Social Functioning  Social Maturity:  No data recorded  Social  Judgement:   "Street Smart"; Victimized   Stress  Stressors:   Other (Comment) (Getting off Benzos.)   Coping Ability:   Overwhelmed   Skill Deficits:   Self-control   Supports:   Family     Religion: Religion/Spirituality Are You A Religious Person?: Yes What is Your Religious Affiliation?: Christian  Leisure/Recreation: Leisure / Recreation Do You Have Hobbies?: Yes Leisure and Hobbies: Being with kids.  Exercise/Diet: Exercise/Diet Do You Exercise?: No Do You Follow a Special Diet?: No Do You Have Any Trouble Sleeping?: Yes Explanation of Sleeping Difficulties: Pt reports, getting 4-5 hours of sleep.   CCA Employment/Education Employment/Work Situation: Employment / Work Situation Employment Situation: Unemployed (Pt reports, her disability is pending, her lawyers are on it.) Has Patient ever Been in the [employer]?: No  Education: Education Is Patient Currently Attending School?: No Last Grade Completed: 10 Did You Product manager?: No   CCA Family/Childhood History Family and Relationship History: Family history Marital status: Separated Separated, when?: 10 years. What types of issues is patient dealing with in the relationship?: UTA Additional relationship information: UTA Does patient have children?: Yes How many children?: 3 How is patient's relationship with their children?: Pt reports, her mother has her oldest child and her three month child lives in [place] and her middle child lives with their paternal great aunt.  Childhood History:  Childhood History Did patient suffer any verbal/emotional/physical/sexual abuse as a child?: No Did patient suffer from severe childhood neglect?: No Has patient ever been sexually abused/assaulted/raped as an adolescent or adult?: No Was the patient ever a victim of a crime or a disaster?: No Witnessed domestic violence?: No Has patient been affected by domestic violence as an adult?:   (NA)  Child/Adolescent Assessment:     CCA Substance Use Alcohol/Drug Use: Alcohol / Drug Use Pain Medications: See MAR Prescriptions: See MAR Over the Counter: See MAR History of alcohol / drug use?: Yes Negative Consequences of Use: Personal relationships Withdrawal Symptoms: Nausea / Vomiting (Hot/cold, headaches.) Substance #1 Name of Substance 1: Xanax. 1 - Age of First Use: Pt has been using for 10 years. 1 - Amount (size/oz): Pt reports, taking 5-8, 1mg  tablets daily. 1 - Frequency: Ongoing. Pt was prescribed Xanax this week and is currently out. 1 - Duration: Ongoing. 1 - Last Use / Amount: Pt reports, she took her last Xanax tablet yesterday. 1 - Method of Aquiring: Pt is prescribed Xanax by PCP. 1- Route of Use: UTA  ASAM's:  Six Dimensions of Multidimensional Assessment  Dimension 1:  Acute Intoxication and/or Withdrawal Potential:      Dimension 2:  Biomedical Conditions and Complications:      Dimension 3:  Emotional, Behavioral, or Cognitive Conditions and Complications:     Dimension 4:  Readiness to Change:     Dimension 5:  Relapse, Continued use, or Continued Problem Potential:     Dimension 6:  Recovery/Living Environment:     ASAM Severity Score:    ASAM Recommended Level of Treatment:     Substance use Disorder (SUD)    Recommendations for Services/Supports/Treatments: Recommendations for Services/Supports/Treatments Recommendations For Services/Supports/Treatments: Other (Comment) (Pt was provided substance use resources for the pt to follow up.)  Discharge Disposition:    DSM5 Diagnoses: Patient Active Problem List   Diagnosis Date Noted   GBS (group B Streptococcus carrier), +RV culture, currently pregnant 04/09/2022   History of hepatitis C 04/08/2022   Vacuum-assisted vaginal delivery 04/07/2022   Thyroid condition 04/07/2022   GDM, class A2 04/07/2022   Thyrotoxicosis 02/06/2020   Pregnancy complicated by subutex maintenance,  antepartum (HCC) 02/06/2020   UTI (urinary tract infection) during pregnancy, second trimester 11/16/2019   Pap smear of cervix with ASCUS, cannot exclude HGSIL 09/01/2019   Opioid dependence (HCC) 08/24/2019   Rh negative, antepartum 08/20/2019   History of pre-eclampsia 08/18/2019   Ovarian cyst 07/24/2019   History of completed stroke 07/12/2019   Smoker 07/12/2019   Moderate episode of recurrent major depressive disorder (HCC) 03/08/2019   Benzodiazepine dependence (HCC) 09/21/2018   Seizure disorder (HCC) 06/22/2017   Generalized anxiety disorder 07/23/2016   Cerebral vasospasm 03/10/2012   Leukocytosis 03/10/2012   Subarachnoid hemorrhage (HCC) 03/03/2012     Referrals to Alternative Service(s): Referred to Alternative Service(s):   Place:   Date:   Time:    Referred to Alternative Service(s):   Place:   Date:   Time:    Referred to Alternative Service(s):   Place:   Date:   Time:    Referred to Alternative Service(s):   Place:   Date:   Time:     Redmond Pulling, [facility] Comprehensive Clinical Assessment (CCA) Screening, Triage and Referral Note  07/30/2022 Quintavia Rogstad [id number]  Chief Complaint:  Chief Complaint  Patient presents with   Addiction Problem   Visit Diagnosis:   Patient Reported Information How did you hear about [name]? Self  What Is the Reason for Your Visit/Call Today? Patient presents to the [facility] requesting Xanax detox.  Patient states that she has been on Xanax for the past 10 years and states that she takes 5 mg to 8 mg daily. She is prescribed 3 mg daily.  Last use was yesterday.  Patient states that she is prescribed subutex 8 mg tid.  Patient states that she last took it this morning.  Patient states that she is currently experiencing withdrawal symptoms of hot flashes, cold chills, diarrhea.  Patient denies SI/HI/Psychosis. Patient is urgent, but not appropriate for a [facility] admission due to the amount of Xanax she is using and her desire to continue  with her subutex.  How Long Has This Been Causing You Problems? > than 6 months  What Do You Feel Would Help You the Most Today? Alcohol or Drug Use Treatment   Have You Recently Had Any Thoughts About Hurting Yourself? No  Are You Planning to Commit Suicide/Harm Yourself At This time? No   Have you Recently Had Thoughts About Hurting Someone  Else? No  Are You Planning to Harm Someone at This Time? No  Explanation: No data recorded  Have You Used Any Alcohol or Drugs in the Past 24 Hours? Yes  How Long Ago Did You Use Drugs or Alcohol? No data recorded What Did You Use and How Much? 8 mg subutex, Xanax 1 mg last night   Do You Currently Have a Therapist/Psychiatrist? No data recorded Name of Therapist/Psychiatrist: No data recorded  Have You Been Recently Discharged From Any Office Practice or Programs? No data recorded Explanation of Discharge From Practice/Program: No data recorded   CCA Screening Triage Referral Assessment Type of Contact: No data recorded Telemedicine Service Delivery:   Is this Initial or Reassessment? No data recorded Date Telepsych consult ordered in [facility]:  No data recorded Time Telepsych consult ordered in [facility]:  No data recorded Location of Assessment: No data recorded Provider Location: No data recorded  Collateral Involvement: No data recorded  Does Patient Have a Court Appointed Legal Guardian? No data recorded Name and Contact of Legal Guardian: No data recorded If Minor and Not Living with Parent(s), Who has Custody? No data recorded Is CPS involved or ever been involved? No data recorded Is APS involved or ever been involved? No data recorded  Patient Determined To Be At Risk for Harm To Self or Others Based on Review of Patient Reported Information or Presenting Complaint? No data recorded Method: No data recorded Availability of Means: No data recorded Intent: No data recorded Notification Required: No data recorded Additional  Information for Danger to Others Potential: No data recorded Additional Comments for Danger to Others Potential: No data recorded Are There Guns or Other Weapons in Your Home? No data recorded Types of Guns/Weapons: No data recorded Are These Weapons Safely Secured?                            No data recorded Who Could Verify You Are Able To Have These Secured: No data recorded Do You Have any Outstanding Charges, Pending Court Dates, Parole/Probation? No data recorded Contacted To Inform of Risk of Harm To Self or Others: No data recorded  Does Patient Present under Involuntary Commitment? No data recorded IVC Papers Initial File Date: No data recorded  [place] No data recorded  Patient Currently Receiving the Following Services: No data recorded  Determination of Need: Urgent (48 hours)   Options For Referral: Other: Comment (Patient will most likely require hospital based detox services.)   Discharge Disposition:     Redmond Pullingreylese D Takeyla Million, [facility]       Redmond Pullingreylese D Kassidy Dockendorf, MS, [facility], [facility] Triage Specialist [phone]

## 2022-07-30 NOTE — Discharge Instructions (Signed)

## 2022-07-30 NOTE — Progress Notes (Signed)
Patient presents to the [facility] requesting Xanax detox.  Patient states that she has been on Xanax for the past 10 years and states that she takes 5 mg to 8 mg daily. She is prescribed 3 mg daily.  Last use was yesterday.  Patient states that she is prescribed subutex 8 mg tid.  Patient states that she last took it this morning.  Patient states that she is currently experiencing withdrawal symptoms of hot flashes, cold chills, diarrhea.  Patient denies SI/HI/Psychosis. Patient is urgent, but not appropriate for a [facility] admission due to the amount of Xanax she is using and her desire to continue with her subutex.

## 2022-07-30 NOTE — ED Provider Notes (Signed)
[facility]al [facility] Medical Screening Exam  Patient Name: Dawn Davies MRN: [id number] Date of Evaluation: 07/31/22 Chief Complaint: " I want detox from xanax".  Diagnosis:  Final diagnoses:  Benzodiazepine abuse, continuous (HCC)  GAD (generalized anxiety disorder)    History of Present illness: Dawn Davies is a 31 y.o. female.  With psychiatric history of generalized anxiety disorder, benzodiazepine use disorder, opioid use disorder, and MDD, who presented voluntarily as a walk-in to [facility] accompanied her fianc, and requesting a 28-day inpatient detox treatment from benzodiazepines, specifically Xanax.  On evaluation patient is calm and cooperative.  Patient reports that she has been getting prescription for benzos for the past 10 years, and she takes about 5 pills a day weekdays, and 10 pills a day on the weekends.   Patient reports she last used benzos yesterday, and "all I have left is 1 pill".  Review of PDMP aware, shows the patient has multiple prescriptions for benzos (xanax), in addition to her  prescribed clonazepam on 07/28/2022 for a 20-day supply.  Patient reports "I just had a baby and I want off".  Patient reports her baby is 31 months old and he is currently with her mom and [place]".  Patient reports she has 2 other kids, and they all live with relatives.  Patient denies suicidal ideation, denies homicidal ideation, and denies audio or visual hallucinations.  Patient denies history of self-harm behaviors.  Patient reports she is currently on Subutex 3 times daily.  Patient reports that she sees a mental health provider kip Ebony Cargo who prescribes her medications, and Chastity Raul Del her therapist.  Patient reports she last had a therapy appointment by phone on Monday.  Patient reports her sleep and appetite as poor.  Patient reports her main stressors are getting off the benzos as she has been on them for too long.  Patient reports she lives with her fianc and  is currently receiving disability benefits.  Patient denies access or means to a weapon or gun at home.   Patient denies a history of psychiatric hospitalizations.    Patient denies a history of withdrawal seizures and reports she does have a history of epilepsy/seizures diagnosed 10 years ago, Pt denies history of present epileptic episodes, and denies being on medication for seizures.  Patient denies SI, denies HI, denies AVH.  Patient denies history of self-harm behaviors  Support and encouragement and reassurance provided about ongoing stressors, patient presented with opportunity for questions.  On evaluation, patient is alert, oriented x 4, and cooperative. Speech is clear and coherent. Pt appears casual. Eye contact is fair. Mood is euthymic, affect is flat. Thought process is logical and thought content is coherent. Pt denies SI/HI/AVH. There is no indication that the patient is responding to internal stimuli. No delusions elicited during this assessment.    Psychiatric Specialty Exam  Presentation  General Appearance:Casual  Eye Contact:Good  Speech:Clear and Coherent  Speech Volume:Normal  Handedness:Right   Mood and Affect  Mood:Euthymic  Affect:Flat   Thought Process  Thought Processes:Coherent  Descriptions of Associations:Intact  Orientation:Full (Time, Place and Person)  Thought Content:WDL    Hallucinations:None  Ideas of Reference:None  Suicidal Thoughts:No  Homicidal Thoughts:No   Sensorium  Memory:Immediate Good; Recent Good  Judgment:Fair  Insight:Fair   Executive Functions  Concentration:Fair  Attention Span:Fair  Recall:Fair  Fund of Knowledge:Fair  Language:Fair   Psychomotor Activity  Psychomotor Activity:Normal   Assets  Assets:Communication Skills; Desire for Improvement; Social Support; Housing; Intimacy   Sleep  Sleep:Poor  Number of hours: No data recorded  Nutritional Assessment (For OBS and FBC admissions  only) Has the patient had a weight loss or gain of 10 pounds or more in the last 3 months?: No Has the patient had a decrease in food intake/or appetite?: No Does the patient have dental problems?: No Does the patient have eating habits or behaviors that may be indicators of an eating disorder including binging or inducing vomiting?: No Has the patient recently lost weight without trying?: 0 Has the patient been eating poorly because of a decreased appetite?: 0 Malnutrition Screening Tool Score: 0    Physical Exam: Physical Exam Constitutional:      General: She is not in acute distress.    Appearance: She is not diaphoretic.  HENT:     Head: Normocephalic.     Right Ear: External ear normal.     Left Ear: External ear normal.     Nose: No congestion.  Eyes:     General:        Right eye: No discharge.        Left eye: No discharge.  Cardiovascular:     Rate and Rhythm: Normal rate.  Pulmonary:     Effort: No respiratory distress.  Chest:     Chest wall: No tenderness.  Neurological:     Mental Status: She is alert and oriented to person, place, and time.  Psychiatric:        Attention and Perception: Attention and perception normal.        Mood and Affect: Mood and affect normal.        Speech: Speech normal.        Behavior: Behavior is cooperative.        Thought Content: Thought content normal. Thought content is not paranoid or delusional. Thought content does not include suicidal ideation. Thought content does not include homicidal or suicidal plan.        Cognition and Memory: Cognition and memory normal.        Judgment: Judgment normal.    Review of Systems  Constitutional:  Negative for chills, diaphoresis and fever.  HENT:  Negative for congestion.   Eyes:  Negative for discharge.  Respiratory:  Negative for cough, shortness of breath and wheezing.   Cardiovascular:  Negative for chest pain and palpitations.  Gastrointestinal:  Negative for diarrhea, nausea  and vomiting.  Neurological:  Negative for dizziness, seizures, loss of consciousness, weakness and headaches.  Psychiatric/Behavioral:  Positive for substance abuse. Negative for depression, hallucinations, memory loss and suicidal ideas. The patient is not nervous/anxious and does not have insomnia.    Blood pressure 104/66, pulse 79, temperature 98 F (36.7 C), temperature source Oral, resp. rate 18, height 5\' 8"  (1.727 m), weight 195 lb (88.5 kg), not currently breastfeeding. Body mass index is 29.65 kg/m.  Musculoskeletal: Strength & Muscle Tone: within normal limits Gait & Station: normal Patient leans: N/A   [facility] MSE Discharge Disposition for Follow up and Recommendations: Based on my evaluation the patient does not appear to have an emergency medical condition and can be discharged with resources and follow up care in outpatient services for Medication Management and Individual Therapy  Patient recommended for discharge and follow-up with outpatient psychiatric services.  Patient provided with resources for outpatient psychiatric medication management/substance abuse/detox  programs.  Patient reports she will follow-up with information tomorrow as provided.  There is no indication of imminent risk of harm to self or others at  this time.  Patient does not meet inpatient psychiatric admission or IVC criteria.  Discussed crisis plan, calling 911, or going to the ED if condition worsens.  Patient verbalized her understanding.  Please refrain from using alcohol or illicit substances, as they can affect your mood and can cause depression, anxiety or other concerning symptoms.  Alcohol can increase the chance that a person will make reckless decisions, like attempting suicide, and can increase the lethality of a drug overdose.   Disp-patient discharged home, recommend follow-up with outpatient psychiatric services.  Condition at discharge is stable.    Mancel Bale,  NP 07/31/2022, 2:20 AM

## 2022-07-30 NOTE — Progress Notes (Signed)
Patient presents to the [facility] requesting Xanax detox.  Patient states that she has been on Xanax for the past 10 years and states that she takes 5 mg to 8 mg daily. She is prescribed 3 mg daily.  Last use was yesterday.  Patient states that she is prescribed subutex 8 mg tid.  Patient states that she last took it this morning.  Patient states that she is currently experiencing withdrawal symptoms of hot flashes, cold chills, diarrhea.  Patient denies SI/HI/Psychosis. Patient is urgent, but not appropriate for a [facility]BC admission due to the amount of Xanax she is using and her desire to continue with her subutex. 

## 2022-08-03 DIAGNOSIS — Z79891 Long term (current) use of opiate analgesic: Secondary | ICD-10-CM | POA: Insufficient documentation

## 2022-08-13 DIAGNOSIS — Z8632 Personal history of gestational diabetes: Secondary | ICD-10-CM | POA: Insufficient documentation

## 2022-08-15 IMAGING — CT CT ABD-PELV W/O CM
2 of 4 series · 16 of 46 positions shown, 18 images · non-contrast
Comparison: None.

CLINICAL DATA: Bilateral flank pain and hematuria 3 weeks.

EXAM:
CT ABDOMEN AND PELVIS WITHOUT CONTRAST
TECHNIQUE: Multidetector CT imaging of the abdomen and pelvis was performed
following the standard protocol without IV contrast.

[Series 2: axial st · axial · 0.79mm/px · z∈[-692,-227]mm · 13 of 105 slices shown, 15 images]
[im 6/105  soft-tissue]
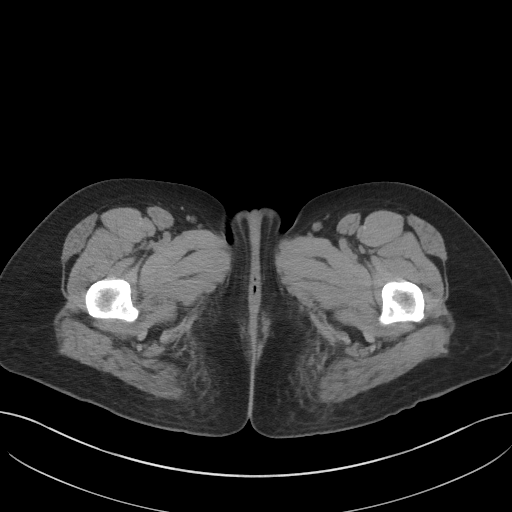
[im 6/105  bone]
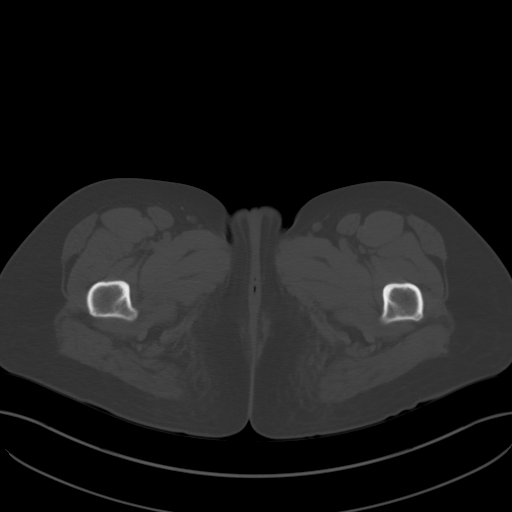
[im 16/105  soft-tissue]
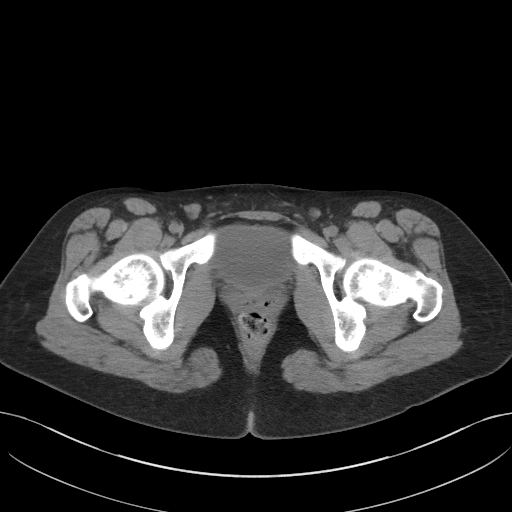
[im 21/105  soft-tissue]
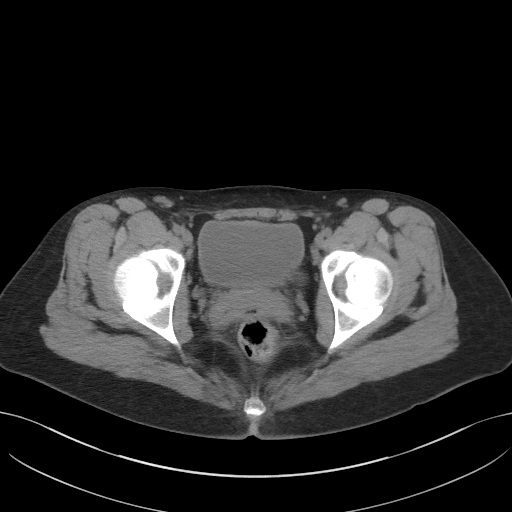
[im 32/105  soft-tissue]
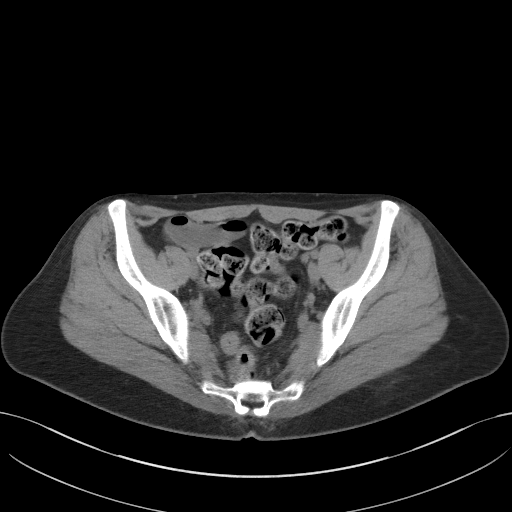
[im 37/105  soft-tissue]
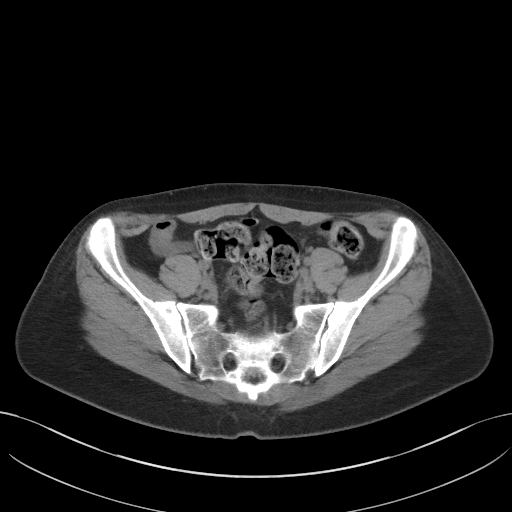
[im 47/105  soft-tissue]
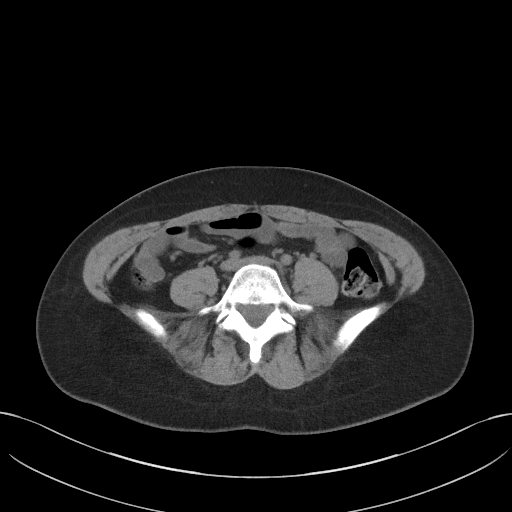
[im 53/105  soft-tissue]
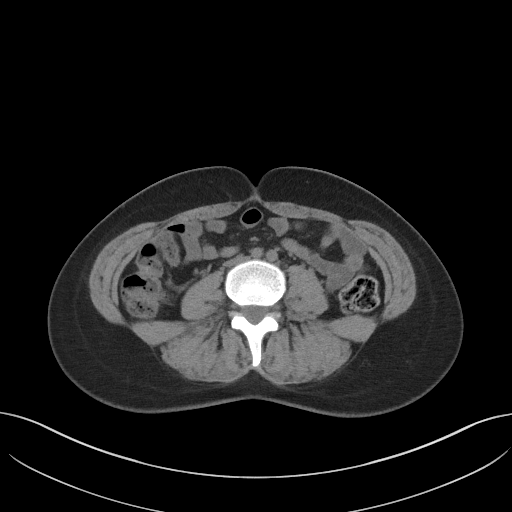
[im 58/105  soft-tissue]
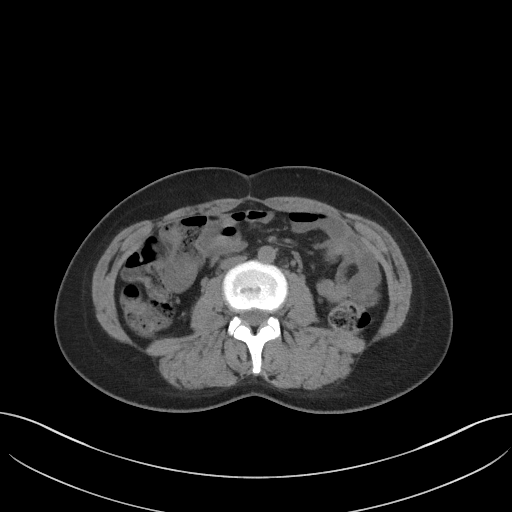
[im 68/105  soft-tissue]
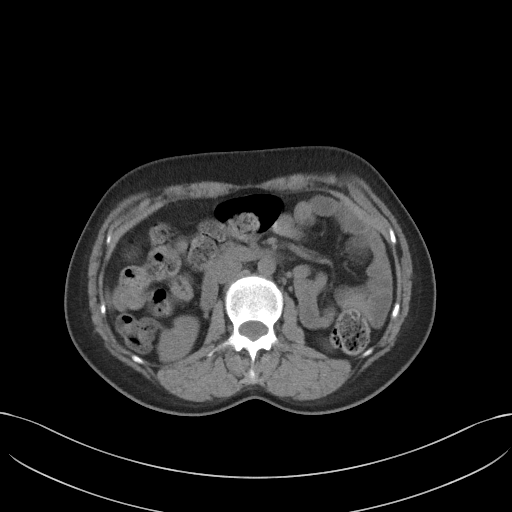
[im 68/105  bone]
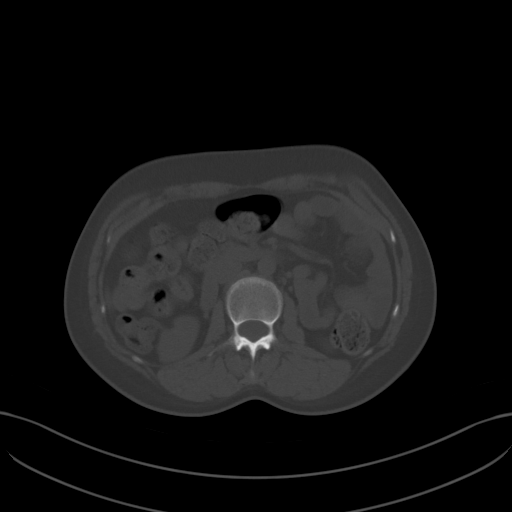
[im 73/105  soft-tissue]
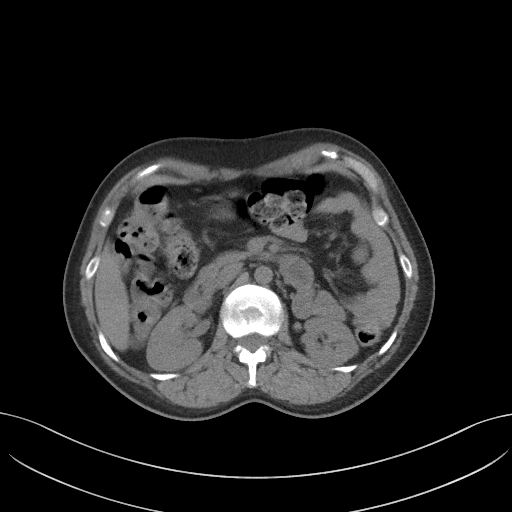
[im 84/105  soft-tissue]
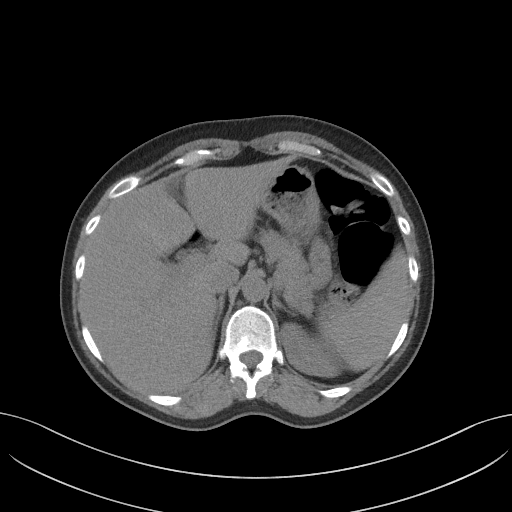
[im 89/105  soft-tissue]
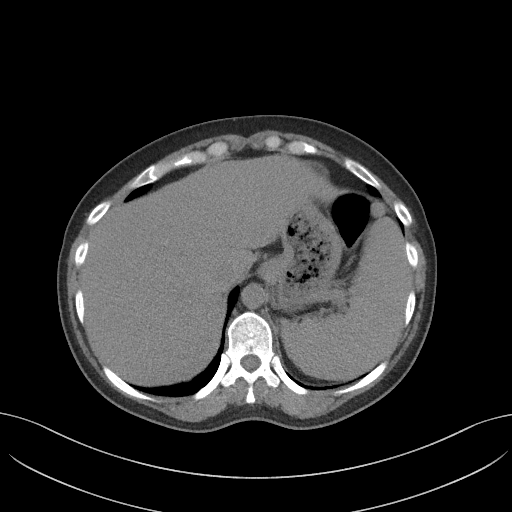
[im 99/105  soft-tissue]
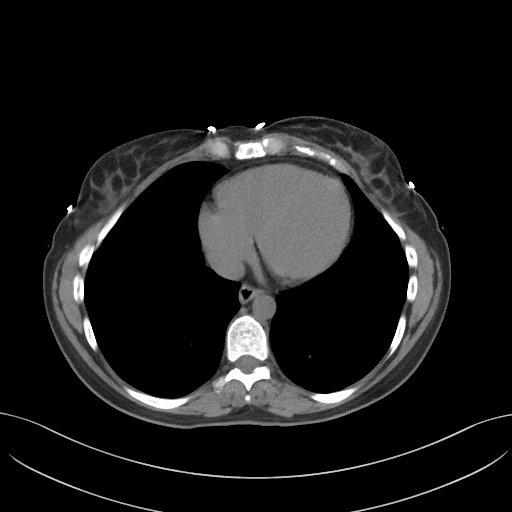

[Series 5: coronal st · coronal · 0.72mm/px · 3 of 83 slices shown]
[im 28/83  soft-tissue]
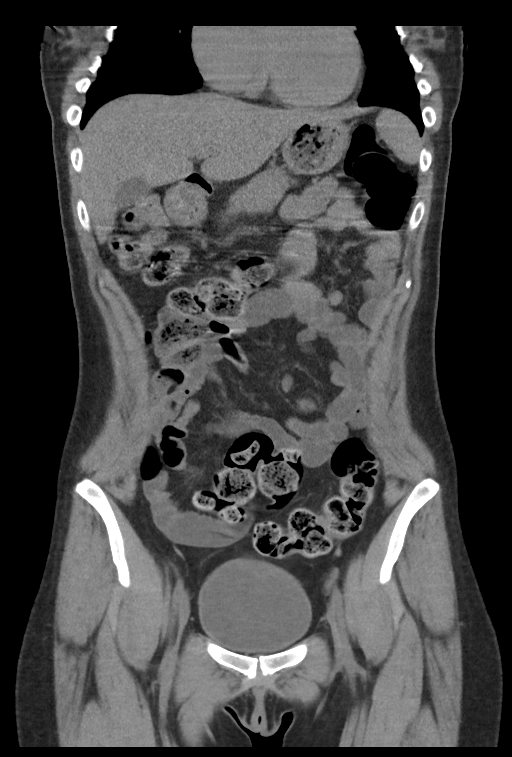
[im 37/83  soft-tissue]
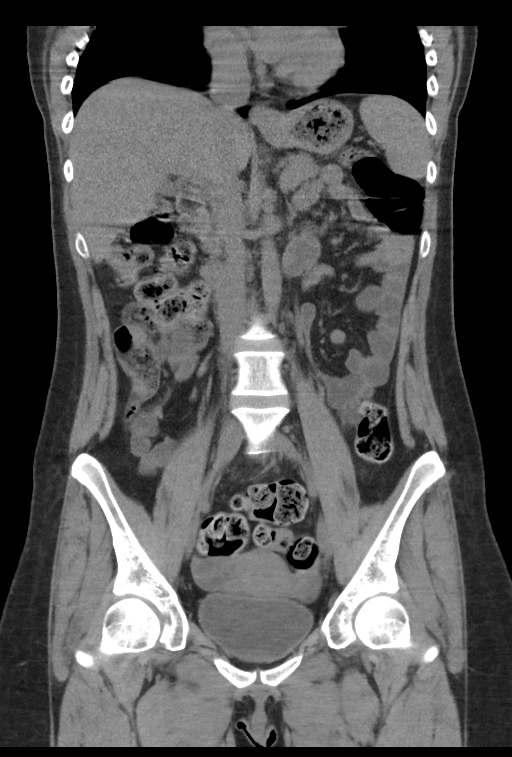
[im 46/83  soft-tissue]
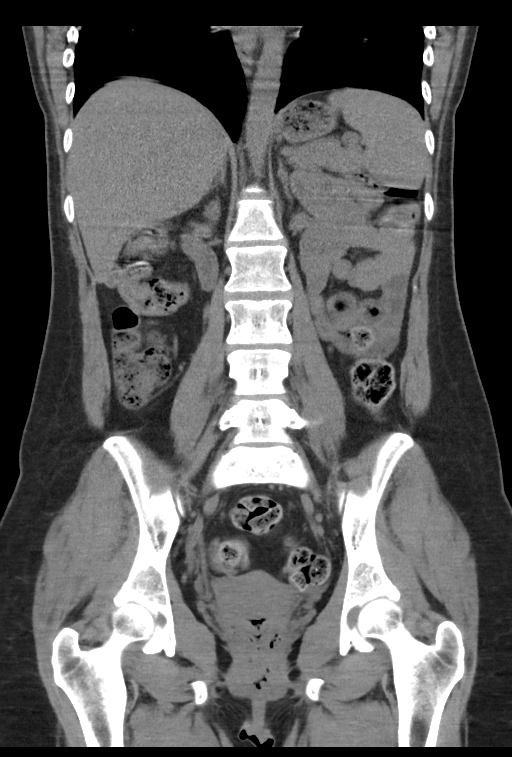

[16 of 46 positions shown; findings below may reference images not displayed]

FINDINGS: Lower chest: The lung bases are clear. No pleural or pericardial
effusion.

Hepatobiliary: No focal liver abnormality is seen. No gallstones,
gallbladder wall thickening, or biliary dilatation.

Pancreas: Unremarkable. No pancreatic ductal dilatation or
surrounding inflammatory changes.

Spleen: Normal in size without focal abnormality.

Adrenals/Urinary Tract: Adrenal glands are unremarkable. Kidneys are
normal, without renal calculi, focal lesion, or hydronephrosis.
Bladder is unremarkable.

Stomach/Bowel: Stomach is within normal limits. The appendix is not
seen but no evidence of appendicitis is present. No evidence of
bowel wall thickening, distention, or inflammatory changes.

Vascular/Lymphatic: No significant vascular findings are present. No
enlarged abdominal or pelvic lymph nodes.

Reproductive: Uterus and bilateral adnexa are unremarkable.

Other: None.

Musculoskeletal: Negative.
IMPRESSION: Negative for urinary tract stone.  Negative CT abdomen and pelvis.

## 2022-12-24 ENCOUNTER — Encounter: Payer: Self-pay | Admitting: Family Medicine

## 2022-12-24 ENCOUNTER — Ambulatory Visit (INDEPENDENT_AMBULATORY_CARE_PROVIDER_SITE_OTHER): Payer: Medicaid Other | Admitting: Family Medicine

## 2022-12-24 VITALS — BP 122/80 | HR 98 | Ht 68.0 in | Wt 204.1 lb

## 2022-12-24 DIAGNOSIS — F1121 Opioid dependence, in remission: Secondary | ICD-10-CM | POA: Diagnosis not present

## 2022-12-24 DIAGNOSIS — F1320 Sedative, hypnotic or anxiolytic dependence, uncomplicated: Secondary | ICD-10-CM | POA: Diagnosis not present

## 2022-12-24 DIAGNOSIS — E079 Disorder of thyroid, unspecified: Secondary | ICD-10-CM

## 2022-12-24 DIAGNOSIS — R739 Hyperglycemia, unspecified: Secondary | ICD-10-CM | POA: Diagnosis present

## 2022-12-24 LAB — POCT GLYCOSYLATED HEMOGLOBIN (HGB A1C): Hemoglobin A1C: 5.3 % (ref 4.0–5.6)

## 2022-12-24 LAB — GLUCOSE, POCT (MANUAL RESULT ENTRY): POC Glucose: 98 mg/dl (ref 70–99)

## 2022-12-24 NOTE — Assessment & Plan Note (Signed)
Reviewed history of thyrotoxicosis with patient. TSH drawn last year was within normal limits. Patient not currently having symptoms. - Consider TSH at next visit

## 2022-12-24 NOTE — Assessment & Plan Note (Signed)
Patient reported taking alprazolam 1mg  TID and has been out for 2 weeks. Chart review of patient's prior PCP notes states that patient was weaned off of BZDs due to severe dependence and has not had a prescription for several months. There was also concern after visiting with psychiatry that BZD and Subutex together would be a dangerous combination. Discussed this with patient and recommended follow-up with psychiatry. Also discussed clinic policy that we never prescribe controlled medications at the first visit.

## 2022-12-24 NOTE — Progress Notes (Signed)
Subjective:    Patient ID: Dawn Davies, female    DOB: 12-Feb-1991, 32 y.o.   MRN: [id number]   CC: New Patient, reports prior PCP is too far away from her current home  HPI:  Opioid Use Disorder - Previously prescribed Subutex 24mg  per day (8mg  TID) - Has been out of medication since 12/10/2022 - Reports she has been going through withdrawal (which is complicated by bzd withdrawal)  Anxiety with BZD disorder - Reports taking alprazolam 1mg  TID - Per chart review, on 08/13/22 had been detoxed off of BZD and has severe use disorder - Last visit with PCP they stated they would not give BZD given hx of severe use and dangerous to take with Subutex   Hx of Gestational Diabetes - Feels like she has been drinking and urinating more often - Reports glucometer from when she was pregnant was reading CBGs in the 400s  Reported hx of thyroid nodules - Not on thyroid medications - Is unsure when she las had her labs checked  Also reports a history of stroke and brain aneurysm with her first child    PMHx: Past Medical History:  Diagnosis Date   Anxiety    Reversible cerebrovascular vasoconstriction syndrome 2013   2 weeks postpartum     Surgical Hx: Past Surgical History:  Procedure Laterality Date   MULTIPLE TOOTH EXTRACTIONS       Family Hx: Family History  Problem Relation Age of Onset   Hypertension Mother    COPD Mother    Juvenile idiopathic arthritis Daughter    Diabetes Paternal Grandmother      Social Hx: Current Social History   (Please include date ( .td) when updating information ) Smokes: 1 pack every 2-3 days, smoking for 15 years Alcohol: none Drugs: none Lives with son and significant other (son's father)   Medications: Patient is out of medications including alprazolam, gabapentin or subutex since January 4th    Preventative Screening Pap smear: 2020 HPV 18/45 positive and NILM  Objective:  BP 122/80   Pulse 98   Ht 5\' 8"  (1.727 m)   Wt  204 lb 2 oz (92.6 kg)   LMP 12/24/2022   SpO2 100%   BMI 31.04 kg/m  Vitals and nursing note reviewed  Gen: well-appearing, NAD CV: RRR, no m/r/g appreciated, no peripheral edema Pulm: CTAB, no wheezes/crackles GI: soft, non-tender, non-distended  Assessment & Plan:    Opioid dependence ([place]) Patient has been out of subutex for 2 weeks. Providers at this office not trained at this time to provide this medication. Discussed that she will need to search for local providers that can take over this prescription.   Benzodiazepine dependence ([place]) Patient reported taking alprazolam 1mg  TID and has been out for 2 weeks. Chart review of patient's prior PCP notes states that patient was weaned off of BZDs due to severe dependence and has not had a prescription for several months. There was also concern after visiting with psychiatry that BZD and Subutex together would be a dangerous combination. Discussed this with patient and recommended follow-up with psychiatry. Also discussed clinic policy that we never prescribe controlled medications at the first visit.   Thyroid condition Reviewed history of thyrotoxicosis with patient. TSH drawn last year was within normal limits. Patient not currently having symptoms. - Consider TSH at next visit  Hyperglycemia, hx of GDMA2 Patient was reporting hyperglycemia to the 400s at home along with polydipsia and polyuria, CBG check in the office was 98  and A1c was 5.3. 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   Ninfa Giannelli, DO

## 2022-12-24 NOTE — Assessment & Plan Note (Signed)
Patient has been out of subutex for 2 weeks. Providers at this office not trained at this time to provide this medication. Discussed that she will need to search for local providers that can take over this prescription.

## 2022-12-24 NOTE — Patient Instructions (Addendum)
At this clinic, right now we would not be able to do Subutex or the alprazolam.  Would recommend is looking up [facility] locations that would prescribe Subutex.  I am also giving you psychiatry referrals for Medicaid, you will have to call and see about getting in with a psychiatric provider for the alprazolam as well.  Your sugar on check today was 98, which is good and your A1c was 5.3 which means no sign of prediabetes or diabetes.   Psychiatry Resource List (Adults and Children) Most of these providers will take Medicaid. please consult your insurance for a complete and updated list of available providers. When calling to make an appointment have your insurance information available to confirm you are covered.   [facility]:Psychiatry and Counseling [address] [place], [place] [address] [phone]  [facility]  [place], [place]:   [facility]: [address]     [phone]   Linna Hoff: [place]. [place],        [phone] [place]: [place],    [place]: [phone] [address],                   [phone] Children: [place] [address]         [phone]  [place] (virtual only) [phone]   [facility]  (Psychiatry only; Adults /children 12 and over, will take Medicaid)  [place], [place], [place] [address]       [phone]   [place] (Psychiatry & counseling ; adults & children ; will take Medicaid [address]  [address]  [place] [address]  Go on-line to complete referral ( [email or web address]/make-[email or web address] [phone]    (Spanish speaking therapists)  [facility]  Psychiatry & counseling; Adults and children;  Call Registration prior to  scheduling an appointment [phone] [address]    [place],  [address]    [phone]  [facility] (Psychiatry & counseling; adults & children; Medicare no Medicaid)  [place],   [address]      [phone]    [facility] (up to age 24)  Psychiatry & counseling ,will take Medicaid, must do counseling to receive psychiatry services  [address] [place] [address]        ([place] (Psychiatry & counseling; adults & children; will take Medicaid) Will need a referral from provider [address],  [place], [place]  [phone]   [facility] --- Walk-In Mon-Friday 8am-3pm ( will take Medicaid, Psychiatry, Adults & children,  [address], [place], [place]   [phone]   [facility]--, Walk-in M-F 8am-12pm and 1pm -3pm   (Counseling, Psychiatry, will take Medicaid, adults & children)  [address], [place], [place]  [phone]

## 2023-01-09 IMAGING — US US OB < 14 WEEKS - US OB TV
1 series · 15 of 28 positions shown · non-contrast
Comparison: CT abdomen pelvis dated March 26, 2021.

CLINICAL DATA: Cramping. Assigned gestational age of 6 weeks, 2
days.

EXAM:
OBSTETRIC <14 WK US AND TRANSVAGINAL OB US
TECHNIQUE: Both transabdominal and transvaginal ultrasound examinations were
performed for complete evaluation of the gestation as well as the
maternal uterus, adnexal regions, and pelvic cul-de-sac.
Transvaginal technique was performed to assess early pregnancy.

[Series 1: us ob < 14 weeks - us ob tv · 15 of 71 slices shown]
[im 1/71]
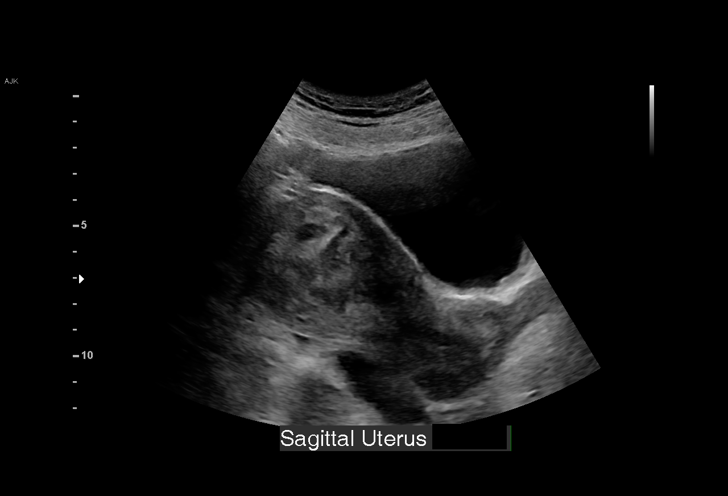
[im 6/71]
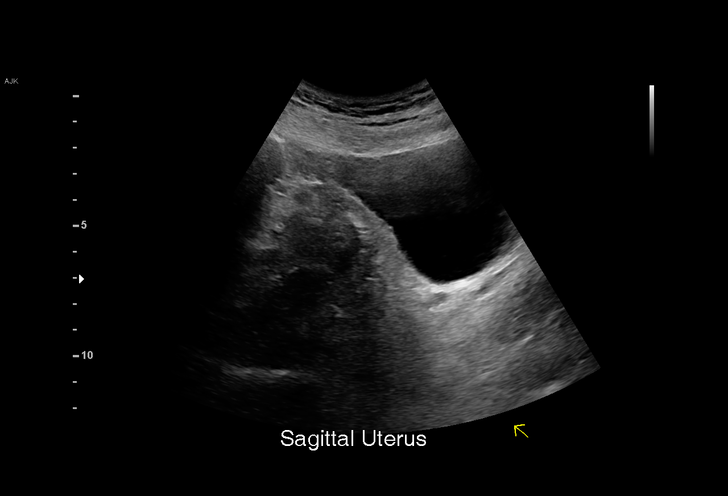
[im 11/71]
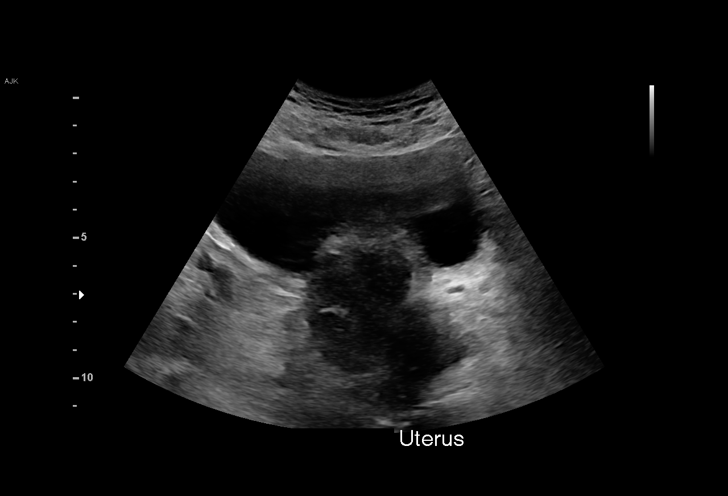
[im 16/71]
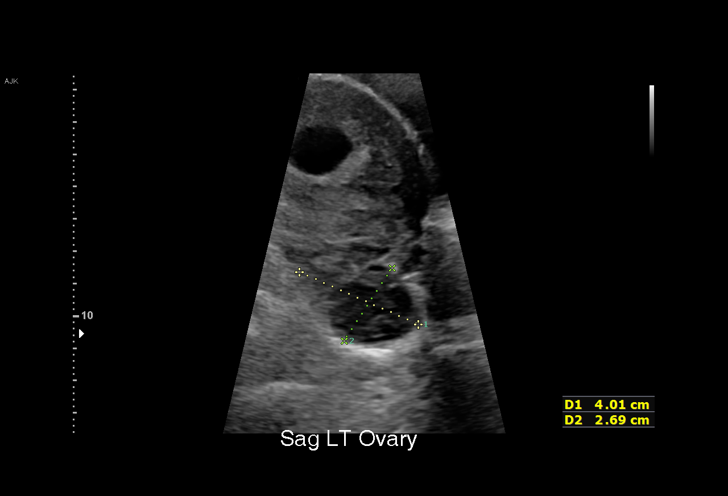
[im 21/71]
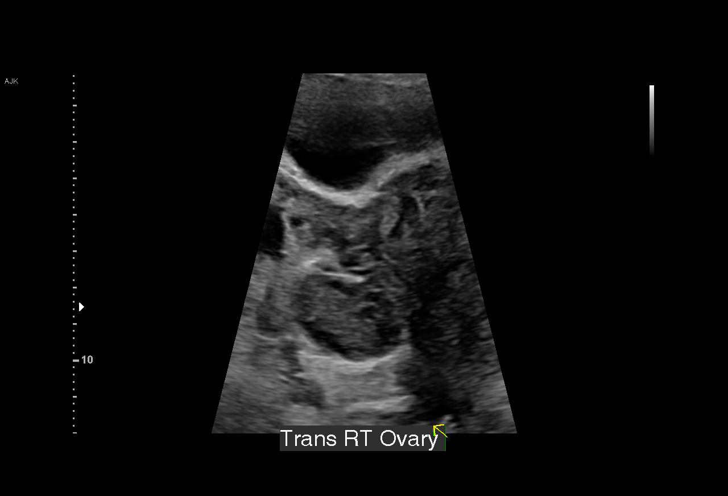
[im 26/71]
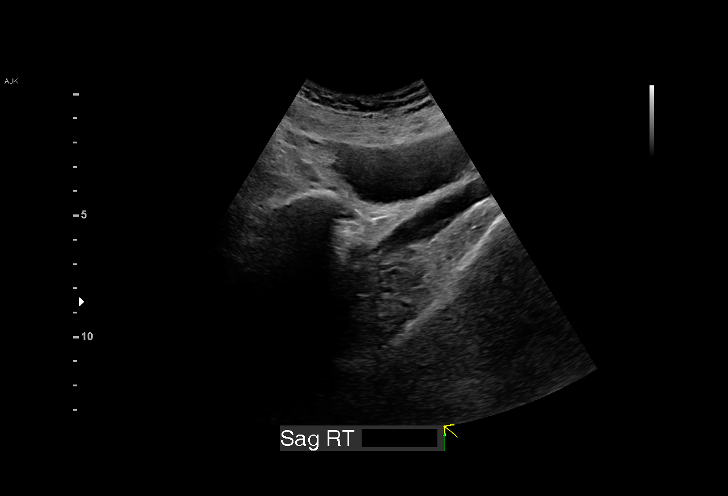
[im 32/71]
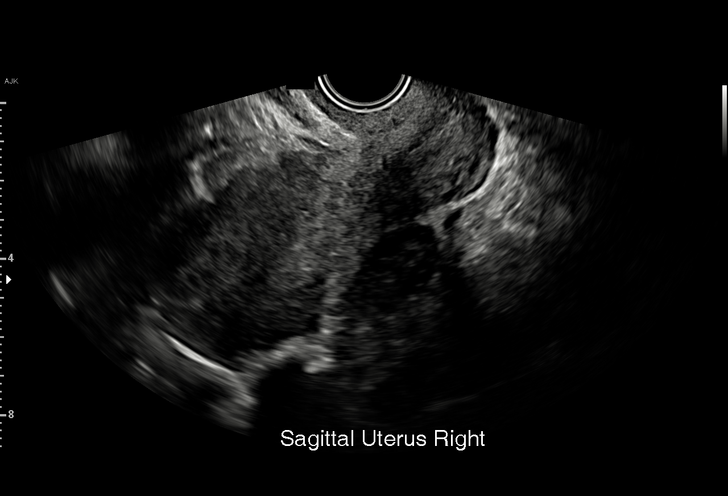
[im 37/71]
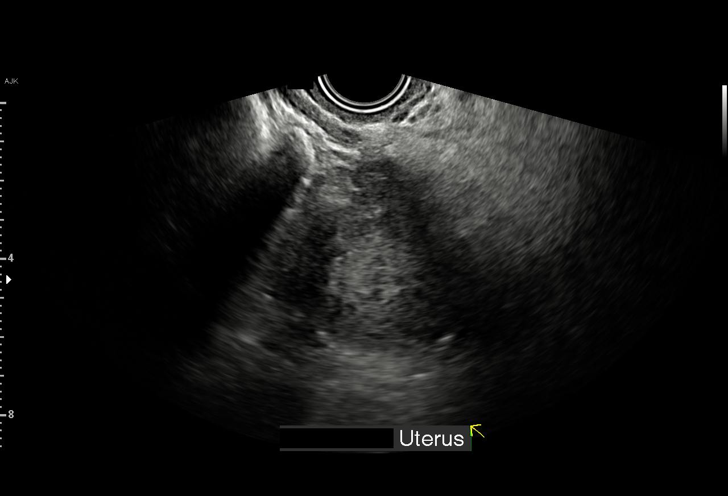
[im 39/71]
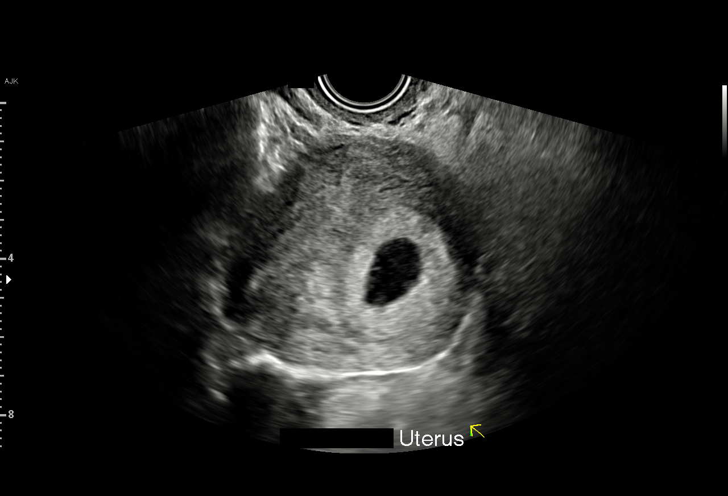
[im 45/71]
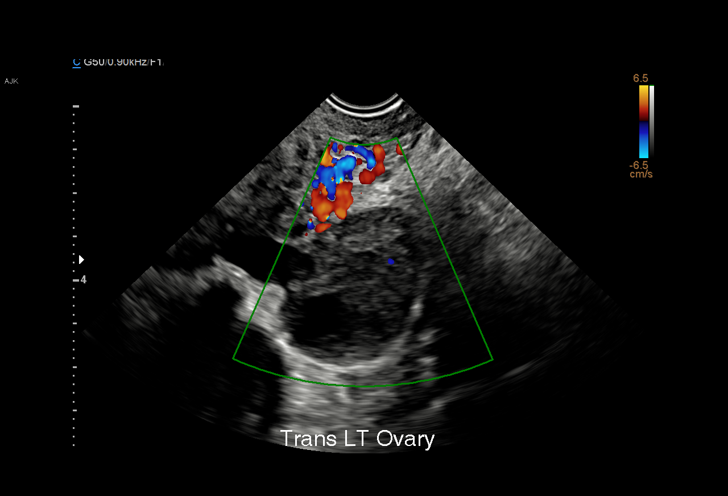
[im 50/71]
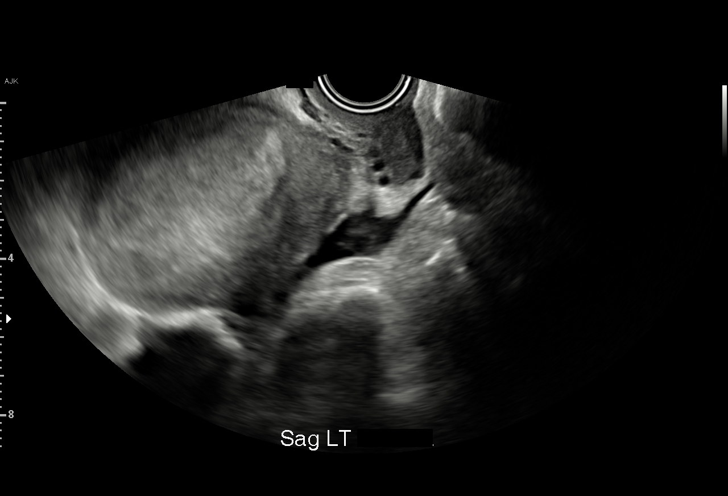
[im 55/71]
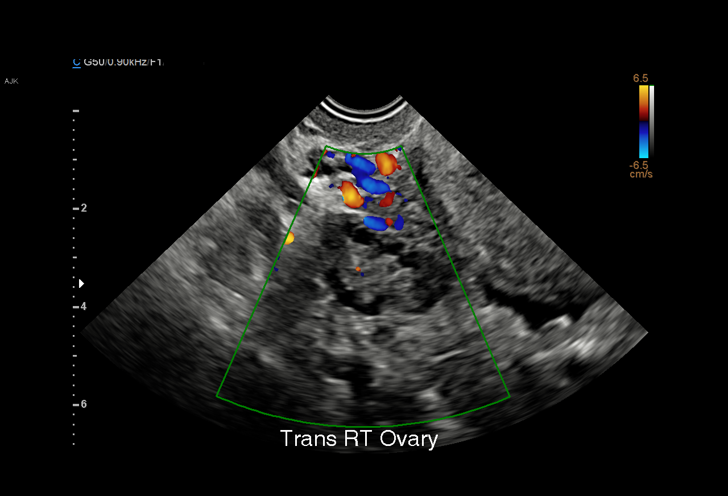
[im 60/71]
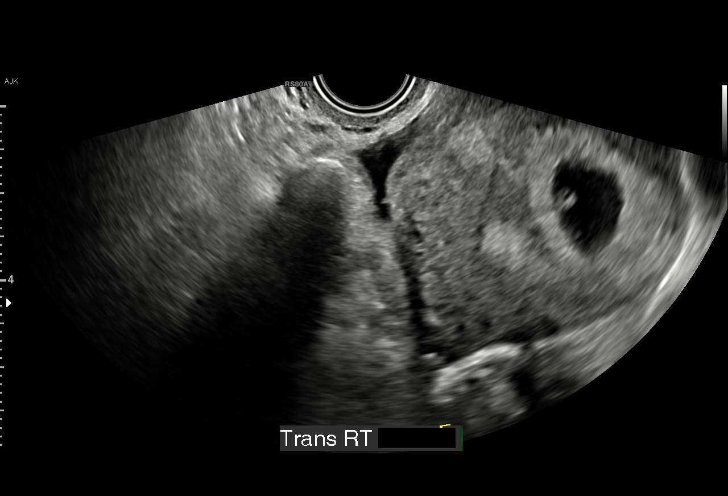
[im 65/71]
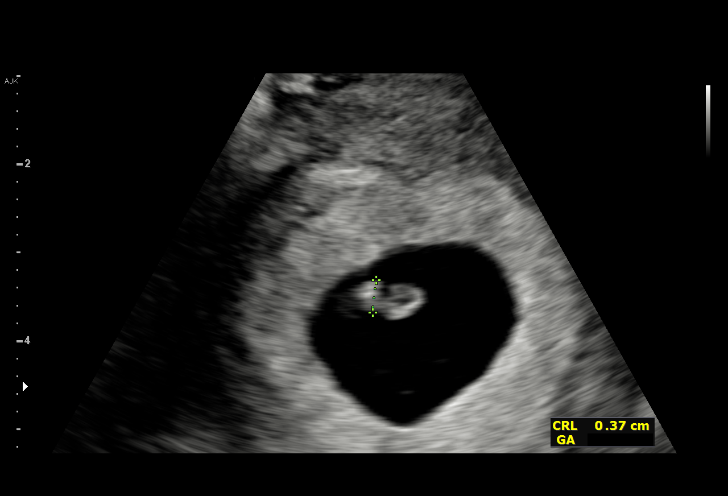
[im 71/71]
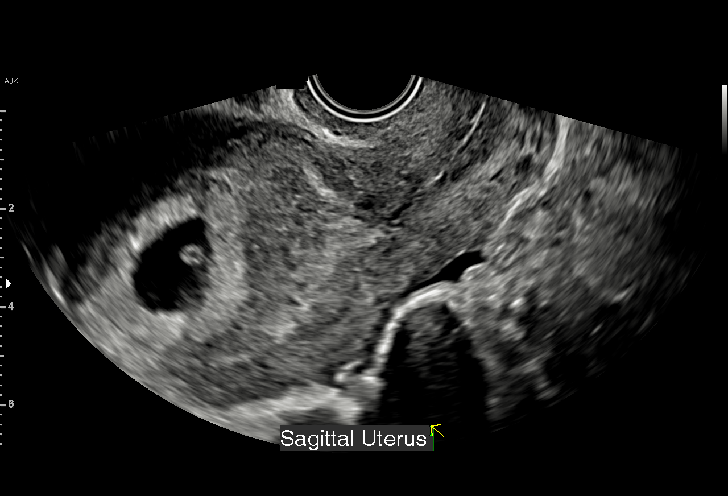

[15 of 28 positions shown; findings below may reference images not displayed]

FINDINGS: Intrauterine gestational sac: Single.

Yolk sac:  Visualized.

Embryo:  Visualized.

Cardiac Activity: Visualized.

Heart Rate: 109 bpm

CRL:  3.9 mm   6 w   0 d                  US EDC: 04/15/2022

Subchorionic hemorrhage:  None visualized.

Maternal uterus/adnexae: Unremarkable.

Trace free fluid in the pelvis.
IMPRESSION: 1. Single live intrauterine pregnancy with estimated gestational age
of 6 weeks, 0 days. No acute abnormality.

## 2023-04-05 ENCOUNTER — Other Ambulatory Visit: Payer: Self-pay | Admitting: Family Medicine

## 2024-03-08 ENCOUNTER — Ambulatory Visit (INDEPENDENT_AMBULATORY_CARE_PROVIDER_SITE_OTHER): Payer: MEDICAID | Admitting: Family Medicine

## 2024-03-08 VITALS — BP 129/87 | HR 98 | Ht 68.0 in | Wt 203.7 lb

## 2024-03-08 DIAGNOSIS — E669 Obesity, unspecified: Secondary | ICD-10-CM | POA: Diagnosis not present

## 2024-03-08 DIAGNOSIS — Z7689 Persons encountering health services in other specified circumstances: Secondary | ICD-10-CM

## 2024-03-08 DIAGNOSIS — E042 Nontoxic multinodular goiter: Secondary | ICD-10-CM

## 2024-03-08 DIAGNOSIS — F411 Generalized anxiety disorder: Secondary | ICD-10-CM

## 2024-03-08 DIAGNOSIS — O1490 Unspecified pre-eclampsia, unspecified trimester: Secondary | ICD-10-CM | POA: Insufficient documentation

## 2024-03-08 DIAGNOSIS — D649 Anemia, unspecified: Secondary | ICD-10-CM | POA: Diagnosis not present

## 2024-03-08 DIAGNOSIS — Z3009 Encounter for other general counseling and advice on contraception: Secondary | ICD-10-CM | POA: Insufficient documentation

## 2024-03-08 MED ORDER — LORATADINE 10 MG PO TABS: 10.0000 mg | ORAL_TABLET | Freq: Every day | ORAL | 3 refills | Status: AC

## 2024-03-08 MED ORDER — VENTOLIN HFA 108 (90 BASE) MCG/ACT IN AERS: 1.0000 | INHALATION_SPRAY | Freq: Four times a day (QID) | RESPIRATORY_TRACT | 2 refills | Status: DC | PRN

## 2024-03-08 MED ORDER — IBUPROFEN 600 MG PO TABS: 600.0000 mg | ORAL_TABLET | Freq: Four times a day (QID) | ORAL | 0 refills | Status: DC | PRN

## 2024-03-08 MED ORDER — NORETHINDRONE ACET-ETHINYL EST 1.5-30 MG-MCG PO TABS: 1.0000 | ORAL_TABLET | Freq: Every day | ORAL | 3 refills | Status: DC

## 2024-03-08 NOTE — Assessment & Plan Note (Signed)
 Given patient history, discussed that we will not restart medications including alprazolam today.  Discussed that she will need to have further evaluation with psychiatry regarding this and they can make determination on optimal medication however very forward.  Patient amenable to this, referral placed today

## 2024-03-08 NOTE — Assessment & Plan Note (Signed)
 Uncertain status currently.  We can proceed with labs today to assess.  Further management recommendations pending results of labs

## 2024-03-08 NOTE — Progress Notes (Signed)
 New Patient Office Visit  Subjective   Patient ID: Dawn Davies, female    DOB: [birth year]  Age: 33 y.o. MRN: [id number]  CC:  Chief Complaint  Patient presents with   New Patient (Initial Visit)    New patient was seeing [facility] in [place] just establishing care needs medication refills     HPI Dawn Davies presents to establish care Last PCP - Dawn Davies  Has questions about contraceptive options today. Has utilized Depo in the past, no other medications utilized in the past.  Last menstrual period about 1 month ago, indicates that last episode of unprotected sex was at least 3 to 5 months ago.  Wonders about being on iron supplement.  Chart indicates history of iron deficiency anemia.  She has not had any recent labs.  History of anxiety and has had this managed by various providers in the past including psychiatry.  She indicates that she is not currently seeing a psychiatrist.  She is requesting a refill of medications including alprazolam.  Chart also notes history of benzodiazepine dependence as well as opioid dependence.  On review of chart, patient has also been prescribed Vistaril as well as clonidine by prior psychiatrist, but symptoms inadequately controlled with this  She is requesting refill of other medications well including ibuprofen, Claritin, butyryl inhaler.  She indicates history of thyroid issues and being told about nodules in her thyroid.  She wonders about any further testing or follow-up needed related this  Patient is originally from [place].  Outpatient Encounter Medications as of 03/08/2024  Medication Sig   ALPRAZolam (XANAX) 1 MG tablet Take 1 mg by mouth 3 (three) times daily as needed for anxiety.   buprenorphine (SUBUTEX) 8 MG SUBL SL tablet Place 24 mg under the tongue daily as needed for withdrawal.   ferrous sulfate 325 (65 FE) MG EC tablet Take by mouth.   gabapentin (NEURONTIN) 600 MG tablet Take 600 mg by mouth 3 (three) times  daily.    loratadine (CLARITIN) 10 MG tablet Take 1 tablet (10 mg total) by mouth daily.   Norethindrone Acetate-Ethinyl Estradiol (LOESTRIN) 1.5-30 MG-MCG tablet Take 1 tablet by mouth daily.   Prenatal Vit-Fe Fumarate-FA (M-NATAL PLUS) 27-1 MG TABS Take 1 tablet by mouth daily.   [DISCONTINUED] ibuprofen (ADVIL) 600 MG tablet Take 1 tablet (600 mg total) by mouth every 6 (six) hours as needed.   [DISCONTINUED] senna-docusate (SENOKOT-S) 8.6-50 MG tablet Take 2 tablets by mouth daily.   [DISCONTINUED] VENTOLIN HFA 108 (90 Base) MCG/ACT inhaler Inhale 2 puffs into the lungs every 4 (four) hours as needed for wheezing or shortness of breath.   ibuprofen (ADVIL) 600 MG tablet Take 1 tablet (600 mg total) by mouth every 6 (six) hours as needed.   VENTOLIN HFA 108 (90 Base) MCG/ACT inhaler Inhale 1-2 puffs into the lungs every 6 (six) hours as needed for wheezing or shortness of breath.   [DISCONTINUED] acetaminophen (TYLENOL) 325 MG tablet Take 2 tablets (650 mg total) by mouth every 6 (six) hours as needed (for pain scale < 4). (Patient not taking: Reported on 03/08/2024)   [DISCONTINUED] ALLERGY RELIEF 10 MG tablet Take 10 mg by mouth daily.   [DISCONTINUED] Blood Pressure Monitor MISC For regular home bp monitoring during pregnancy   No facility-administered encounter medications on file as of 03/08/2024.    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   Stroke [facility])     Past Surgical History:  Procedure Laterality Date   MULTIPLE TOOTH EXTRACTIONS      Family History  Problem Relation Age of Onset   Hypertension Mother    COPD Mother    Juvenile idiopathic arthritis Daughter    Diabetes Paternal Grandmother     Social History   Socioeconomic History   Marital status: Legally Separated    Spouse name: Not on file   Number of children: 1   Years of education: Not on file    Highest education level: 10th grade  Occupational History   Not on file  Tobacco Use   Smoking status: Every Day    Current packs/day: 1.00    Types: Cigarettes    Passive exposure: Current   Smokeless tobacco: Never  Vaping Use   Vaping status: Never Used  Substance and Sexual Activity   Alcohol use: Not Currently   Drug use: Not Currently   Sexual activity: Yes    Birth control/protection: None  Other Topics Concern   Not on file  Social History Narrative   Pt is left handed   Lives in 2 story home with her daughter, brother and both parents   10th grade education   Has 1 child   On disability      Pt lives with fiance in [place] and two other children live with other family members.   Social Drivers of Corporate investment banker Strain: Low Risk  (03/08/2024)   Overall Financial Resource Strain (CARDIA)    Difficulty of Paying Living Expenses: Not very hard  Food Insecurity: Food Insecurity Present (03/08/2024)   Hunger Vital Sign    Worried About Running Out of Food in the Last Year: Often true    Ran Out of Food in the Last Year: Sometimes true  Transportation Needs: No Transportation Needs (03/08/2024)   PRAPARE - Administrator, Civil Service (Medical): No    Lack of Transportation (Non-Medical): No  Physical Activity: Insufficiently Active (03/08/2024)   Exercise Vital Sign    Days of Exercise per Week: 2 days    Minutes of Exercise per Session: 30 min  Stress: Stress Concern Present (03/08/2024)   [employer] of Occupational Health - Occupational Stress Questionnaire    Feeling of Stress : Very much  Social Connections: Socially Integrated (03/08/2024)   Social Connection and Isolation Panel [NHANES]    Frequency of Communication with Friends and Family: More than three times a week    Frequency of Social Gatherings with Friends and Family: More than three times a week    Attends Religious Services: More than 4 times per year    Active Member of  [employer] or Organizations: Yes    Attends Engineer, structural: More than 4 times per year    Marital Status: Living with partner  Intimate Partner Violence: Not At Risk (12/10/2022)   [facility]   HITS    Over the last 12 months how often did your partner physically hurt you?: Never    Over the last 12 months how often did your partner insult you or talk down to you?: Never    Over the last 12 months how often did your partner threaten you with physical harm?: Never    Over the last 12 months how often did your partner scream or curse at you?: Never    Objective   BP 129/87 (  BP Location: Right Arm, Patient Position: Sitting, Cuff Size: Normal)   Pulse 98   Ht 5\' 8"  (1.727 m)   Wt 203 lb 11.2 oz (92.4 kg)   SpO2 99%   BMI 30.97 kg/m   Physical Exam  33 year old female in no acute distress Cardiovascular exam with regular rate and rhythm Lungs clear to auscultation bilaterally  Assessment & Plan:   Anemia, unspecified type Assessment & Plan: Uncertain status currently.  We can proceed with labs today to assess.  Further management recommendations pending results of labs  Orders: -     CBC with Differential/Platelet -     Comprehensive metabolic panel with GFR -     Iron, TIBC and Ferritin Panel -     Beta hCG quant (ref lab)  Multiple thyroid nodules Assessment & Plan: On review of chart, it does appear that her last primary care doctor had referred patient to ENT as well as endocrinology, however patient ultimately did not have evaluation with either specialty.  We can proceed with labs today as below.  We will also place order for thyroid ultrasound to be completed.  She indicates that she has not had prior imaging of thyroid.  Further recommendations pending results of testing  Orders: -     TSH + free T4 -     T3 -     US THYROID; Future  Generalized anxiety disorder Assessment & Plan: Given patient history, discussed that we  will not restart medications including alprazolam today.  Discussed that she will need to have further evaluation with psychiatry regarding this and they can make determination on optimal medication however very forward.  Patient amenable to this, referral placed today  Orders: -     Ambulatory referral to Psychiatry  Obesity (BMI 30-39.9) -     Comprehensive metabolic panel with GFR -     Hemoglobin A1c  Encounter for counseling regarding contraception Assessment & Plan: Patient reports that her last menstrual period was about 1 month ago.  Last episode of unprotected sex was 3 to 5 months ago.  We can proceed with pregnancy testing today and patient is wanting to start on oral contraceptive pill.  We did discuss potential risks related to this.  Discussed appropriate use of medication.  She also would like to establish with OB/GYN.  Referral placed today.  Can discuss further with them regarding other methods of birth control if desired and determine if she would ultimately like to continue with OCP or switch to alternative method  Orders: -     Beta hCG quant (ref lab)  Encounter to establish care -     Ambulatory referral to Obstetrics / Gynecology  Other orders -     Loratadine; Take 1 tablet (10 mg total) by mouth daily.  Dispense: 90 tablet; Refill: 3 -     Ibuprofen; Take 1 tablet (600 mg total) by mouth every 6 (six) hours as needed.  Dispense: 60 tablet; Refill: 0 -     Ventolin HFA; Inhale 1-2 puffs into the lungs every 6 (six) hours as needed for wheezing or shortness of breath.  Dispense: 18 g; Refill: 2 -     Norethindrone Acet-Ethinyl Est; Take 1 tablet by mouth daily.  Dispense: 28 tablet; Refill: 3  Return in about 4 months (around 07/08/2024).   Spent 64 minutes on this patient encounter, including preparation, chart review, face-to-face counseling with patient and coordination of care, and documentation of encounter   ___________________________________________ Marcy Salvo  de [place], MD, ABFM, [facility] Primary [facility] and Sports Medicine [facility]

## 2024-03-08 NOTE — Assessment & Plan Note (Signed)
 Patient reports that her last menstrual period was about 1 month ago.  Last episode of unprotected sex was 3 to 5 months ago.  We can proceed with pregnancy testing today and patient is wanting to start on oral contraceptive pill.  We did discuss potential risks related to this.  Discussed appropriate use of medication.  She also would like to establish with OB/GYN.  Referral placed today.  Can discuss further with them regarding other methods of birth control if desired and determine if she would ultimately like to continue with OCP or switch to alternative method

## 2024-03-08 NOTE — Patient Instructions (Signed)
  Medication Instructions:  Your physician recommends that you continue on your current medications as directed. Please refer to the Current Medication list given to you today. --If you need a refill on any your medications before your next appointment, please call your pharmacy first. If no refills are authorized on file call the office.-- Lab Work: Your physician has recommended that you have lab work today: today If you have labs (blood work) drawn today and your tests are completely normal, you will receive your results via MyChart message OR a phone call from our staff.  Please ensure you check your voicemail in the event that you authorized detailed messages to be left on a delegated number. If you have any lab test that is abnormal or we need to change your treatment, we will call you to review the results.  Referrals/Procedures/Imaging: Referral placed  Follow-Up: Your next appointment:   Your physician recommends that you schedule a follow-up appointment in: 4 month follow up  with Dr. de [place]  You will receive a text message or e-mail with a link to a survey about your care and experience with [name] today! We would greatly appreciate your feedback!   Thanks for letting us be apart of your health journey!!  Primary Care and Sports Medicine   Dr. Ceasar Mons [place]   We encourage you to activate your patient portal called "MyChart".  Sign up information is provided on this After Visit Summary.  MyChart is used to connect with patients for Virtual Visits (Telemedicine).  Patients are able to view lab/test results, encounter notes, upcoming appointments, etc.  Non-urgent messages can be sent to your provider as well. To learn more about what you can do with MyChart, please visit --  [email or web address].

## 2024-03-08 NOTE — Assessment & Plan Note (Signed)
 On review of chart, it does appear that her last primary care doctor had referred patient to ENT as well as endocrinology, however patient ultimately did not have evaluation with either specialty.  We can proceed with labs today as below.  We will also place order for thyroid ultrasound to be completed.  She indicates that she has not had prior imaging of thyroid.  Further recommendations pending results of testing

## 2024-03-09 LAB — CBC WITH DIFFERENTIAL/PLATELET
Basophils Absolute: 0 10*3/uL (ref 0.0–0.2)
Basos: 0 %
EOS (ABSOLUTE): 0.4 10*3/uL (ref 0.0–0.4)
Eos: 4 %
Hematocrit: 44.6 % (ref 34.0–46.6)
Hemoglobin: 15 g/dL (ref 11.1–15.9)
Immature Grans (Abs): 0.1 10*3/uL (ref 0.0–0.1)
Immature Granulocytes: 1 %
Lymphocytes Absolute: 2.5 10*3/uL (ref 0.7–3.1)
Lymphs: 25 %
MCH: 29.8 pg (ref 26.6–33.0)
MCHC: 33.6 g/dL (ref 31.5–35.7)
MCV: 89 fL (ref 79–97)
Monocytes Absolute: 0.7 10*3/uL (ref 0.1–0.9)
Monocytes: 7 %
Neutrophils Absolute: 6.4 10*3/uL (ref 1.4–7.0)
Neutrophils: 63 %
Platelets: 328 10*3/uL (ref 150–450)
RBC: 5.03 x10E6/uL (ref 3.77–5.28)
RDW: 13.8 % (ref 11.7–15.4)
WBC: 10.1 10*3/uL (ref 3.4–10.8)

## 2024-03-09 LAB — COMPREHENSIVE METABOLIC PANEL WITH GFR
ALT: 20 IU/L (ref 0–32)
AST: 22 IU/L (ref 0–40)
Albumin: 4.8 g/dL (ref 3.9–4.9)
Alkaline Phosphatase: 89 IU/L (ref 44–121)
BUN/Creatinine Ratio: 15 (ref 9–23)
BUN: 13 mg/dL (ref 6–20)
Bilirubin Total: <0.2 mg/dL (ref 0.0–1.2)
CO2: 21 mmol/L (ref 20–29)
Calcium: 9.9 mg/dL (ref 8.7–10.2)
Chloride: 103 mmol/L (ref 96–106)
Creatinine, Ser: 0.86 mg/dL (ref 0.57–1.00)
Globulin, Total: 2.6 g/dL (ref 1.5–4.5)
Glucose: 94 mg/dL (ref 70–99)
Potassium: 4.6 mmol/L (ref 3.5–5.2)
Sodium: 140 mmol/L (ref 134–144)
Total Protein: 7.4 g/dL (ref 6.0–8.5)
eGFR: 91 mL/min/{1.73_m2} (ref >59)

## 2024-03-09 LAB — HEMOGLOBIN A1C
Est. average glucose Bld gHb Est-mCnc: 105 mg/dL
Hgb A1c MFr Bld: 5.3 % (ref 4.8–5.6)

## 2024-03-09 LAB — TSH+FREE T4
Free T4: 1.23 ng/dL (ref 0.82–1.77)
TSH: 0.738 u[IU]/mL (ref 0.450–4.500)

## 2024-03-09 LAB — IRON,TIBC AND FERRITIN PANEL
Ferritin: 23 ng/mL (ref 15–150)
Iron Saturation: 16 % (ref 15–55)
Iron: 66 ug/dL (ref 27–159)
Total Iron Binding Capacity: 419 ug/dL (ref 250–450)
UIBC: 353 ug/dL (ref 131–425)

## 2024-03-09 LAB — BETA HCG QUANT (REF LAB): hCG Quant: <1 m[IU]/mL

## 2024-03-09 LAB — T3: T3, Total: 136 ng/dL (ref 71–180)

## 2024-03-13 ENCOUNTER — Encounter (HOSPITAL_BASED_OUTPATIENT_CLINIC_OR_DEPARTMENT_OTHER): Payer: Self-pay | Admitting: Family Medicine

## 2024-03-15 ENCOUNTER — Telehealth (HOSPITAL_BASED_OUTPATIENT_CLINIC_OR_DEPARTMENT_OTHER): Payer: Self-pay | Admitting: *Deleted

## 2024-03-15 ENCOUNTER — Encounter (HOSPITAL_BASED_OUTPATIENT_CLINIC_OR_DEPARTMENT_OTHER): Payer: Self-pay | Admitting: *Deleted

## 2024-03-15 ENCOUNTER — Encounter (HOSPITAL_BASED_OUTPATIENT_CLINIC_OR_DEPARTMENT_OTHER): Payer: Self-pay | Admitting: Family Medicine

## 2024-03-15 NOTE — Telephone Encounter (Signed)
 Provider aware see mychart msgs

## 2024-03-15 NOTE — Telephone Encounter (Signed)
 Copied from CRM [id number]. Topic: Referral - Question >> Mar 15, 2024  4:40 PM Priscille Loveless wrote: Reason for CRM: Maddie with [facility] called and stated that they can't get her in until August and that is way to far out for the patient. Maddie wanted to know can you continue to write rx's for her until then or try a different facility.

## 2024-03-15 NOTE — Telephone Encounter (Signed)
 Patient was referred to [facility] per notes they are not accepting new patients can the patient referral be sent elsewhere to get her in as she is needing medications refilled

## 2024-03-17 ENCOUNTER — Telehealth: Payer: MEDICAID | Admitting: Physician Assistant

## 2024-03-17 DIAGNOSIS — M546 Pain in thoracic spine: Secondary | ICD-10-CM | POA: Diagnosis not present

## 2024-03-17 MED ORDER — NAPROXEN 500 MG PO TABS: 500.0000 mg | ORAL_TABLET | Freq: Two times a day (BID) | ORAL | 0 refills | Status: DC

## 2024-03-17 MED ORDER — CYCLOBENZAPRINE HCL 10 MG PO TABS
5.0000 mg | ORAL_TABLET | Freq: Three times a day (TID) | ORAL | 0 refills | Status: DC | PRN
Start: 2024-03-17 — End: 2024-03-27

## 2024-03-17 MED ORDER — BIOFREEZE COOL THE PAIN LARGE 5 % EX PTCH: MEDICATED_PATCH | CUTANEOUS | 0 refills | Status: DC

## 2024-03-17 NOTE — Patient Instructions (Signed)
 Dawn Davies, thank you for joining Dawn Loveless, PA-C for today's virtual visit.  While this provider is not your primary care provider (PCP), if your PCP is located in our provider database this encounter information will be shared with them immediately following your visit.   A [facility] MyChart account gives you access to today's visit and all your visits, tests, and labs performed at [facility] " click here if you don't have a [facility] MyChart account or go to [email or web address]  Consent: (Patient) Dawn Davies provided verbal consent for this virtual visit at the beginning of the encounter.  Current Medications:  Current Outpatient Medications:    cyclobenzaprine (FLEXERIL) 10 MG tablet, Take 0.5-1 tablets (5-10 mg total) by mouth 3 (three) times daily as needed., Disp: 30 tablet, Rfl: 0   Menthol, Topical Analgesic, (BIOFREEZE COOL THE PAIN LARGE) 5 % PTCH, Use 1 patch every 12 hours as needed, Disp: 14 patch, Rfl: 0   naproxen (NAPROSYN) 500 MG tablet, Take 1 tablet (500 mg total) by mouth 2 (two) times daily with a meal., Disp: 30 tablet, Rfl: 0   ALPRAZolam (XANAX) 1 MG tablet, Take 1 mg by mouth 3 (three) times daily as needed for anxiety., Disp: , Rfl:    buprenorphine (SUBUTEX) 8 MG SUBL SL tablet, Place 24 mg under the tongue daily as needed for withdrawal., Disp: , Rfl:    ferrous sulfate 325 (65 FE) MG EC tablet, Take by mouth., Disp: , Rfl:    gabapentin (NEURONTIN) 600 MG tablet, Take 600 mg by mouth 3 (three) times daily. , Disp: , Rfl:    ibuprofen (ADVIL) 600 MG tablet, Take 1 tablet (600 mg total) by mouth every 6 (six) hours as needed., Disp: 60 tablet, Rfl: 0   loratadine (CLARITIN) 10 MG tablet, Take 1 tablet (10 mg total) by mouth daily., Disp: 90 tablet, Rfl: 3   Norethindrone Acetate-Ethinyl Estradiol (LOESTRIN) 1.5-30 MG-MCG tablet, Take 1 tablet by mouth daily., Disp: 28 tablet, Rfl: 3   Prenatal Vit-Fe Fumarate-FA (M-NATAL PLUS)  27-1 MG TABS, Take 1 tablet by mouth daily., Disp: , Rfl:    VENTOLIN HFA 108 (90 Base) MCG/ACT inhaler, Inhale 1-2 puffs into the lungs every 6 (six) hours as needed for wheezing or shortness of breath., Disp: 18 g, Rfl: 2   Medications ordered in this encounter:  Meds ordered this encounter  Medications   cyclobenzaprine (FLEXERIL) 10 MG tablet    Sig: Take 0.5-1 tablets (5-10 mg total) by mouth 3 (three) times daily as needed.    Dispense:  30 tablet    Refill:  0    Supervising Provider:   Merrilee Jansky [[id number]]   naproxen (NAPROSYN) 500 MG tablet    Sig: Take 1 tablet (500 mg total) by mouth 2 (two) times daily with a meal.    Dispense:  30 tablet    Refill:  0    Supervising Provider:   Merrilee Jansky [[id number]]   Menthol, Topical Analgesic, (BIOFREEZE COOL THE PAIN LARGE) 5 % PTCH    Sig: Use 1 patch every 12 hours as needed    Dispense:  14 patch    Refill:  0    Supervising Provider:   Merrilee Jansky [[id number]]     *If you need refills on other medications prior to your next appointment, please contact your pharmacy*  Follow-Up: Call back or seek an in-person evaluation if the symptoms worsen or if the condition fails to improve as  anticipated.  [facility] [phone]  Other Instructions  Acute Back Pain, Adult Acute back pain is sudden and usually short-lived. It is often caused by an injury to the muscles and tissues in the back. The injury may result from: A muscle, tendon, or ligament getting overstretched or torn. Ligaments are tissues that connect bones to each other. Lifting something improperly can cause a back strain. Wear and tear (degeneration) of the spinal disks. Spinal disks are circular tissue that provide cushioning between the bones of the spine (vertebrae). Twisting motions, such as while playing sports or doing yard work. A hit to the back. Arthritis. You may have a physical exam, lab tests, and imaging tests to find the  cause of your pain. Acute back pain usually goes away with rest and home care. Follow these instructions at home: Managing pain, stiffness, and swelling Take over-the-counter and prescription medicines only as told by your health care provider. Treatment may include medicines for pain and inflammation that are taken by mouth or applied to the skin, or muscle relaxants. Your health care provider may recommend applying ice during the first 24-48 hours after your pain starts. To do this: Put ice in a plastic bag. Place a towel between your skin and the bag. Leave the ice on for 20 minutes, 2-3 times a day. Remove the ice if your skin turns bright red. This is very important. If you cannot feel pain, heat, or cold, you have a greater risk of damage to the area. If directed, apply heat to the affected area as often as told by your health care provider. Use the heat source that your health care provider recommends, such as a moist heat pack or a heating pad. Place a towel between your skin and the heat source. Leave the heat on for 20-30 minutes. Remove the heat if your skin turns bright red. This is especially important if you are unable to feel pain, heat, or cold. You have a greater risk of getting burned. Activity  Do not stay in bed. Staying in bed for more than 1-2 days can delay your recovery. Sit up and stand up straight. Avoid leaning forward when you sit or hunching over when you stand. If you work at a desk, sit close to it so you do not need to lean over. Keep your chin tucked in. Keep your neck drawn back, and keep your elbows bent at a 90-degree angle (right angle). Sit high and close to the steering wheel when you drive. Add lower back (lumbar) support to your car seat, if needed. Take short walks on even surfaces as soon as you are able. Try to increase the length of time you walk each day. Do not sit, drive, or stand in one place for more than 30 minutes at a time. Sitting or standing  for long periods of time can put stress on your back. Do not drive or use heavy machinery while taking prescription pain medicine. Use proper lifting techniques. When you bend and lift, use positions that put less stress on your back: [place] your knees. Keep the load close to your body. Avoid twisting. Exercise regularly as told by your health care provider. Exercising helps your back heal faster and helps prevent back injuries by keeping muscles strong and flexible. Work with a physical therapist to make a safe exercise program, as recommended by your health care provider. Do any exercises as told by your physical therapist. Lifestyle Maintain a healthy weight. Extra  weight puts stress on your back and makes it difficult to have good posture. Avoid activities or situations that make you feel anxious or stressed. Stress and anxiety increase muscle tension and can make back pain worse. Learn ways to manage anxiety and stress, such as through exercise. General instructions Sleep on a firm mattress in a comfortable position. Try lying on your side with your knees slightly bent. If you lie on your back, put a pillow under your knees. Keep your head and neck in a straight line with your spine (neutral position) when using electronic equipment like smartphones or pads. To do this: Raise your smartphone or pad to look at it instead of bending your head or neck to look down. Put the smartphone or pad at the level of your face while looking at the screen. Follow your treatment plan as told by your health care provider. This may include: Cognitive or behavioral therapy. Acupuncture or massage therapy. Meditation or yoga. Contact a health care provider if: You have pain that is not relieved with rest or medicine. You have increasing pain going down into your legs or buttocks. Your pain does not improve after 2 weeks. You have pain at night. You lose weight without trying. You have a fever or  chills. You develop nausea or vomiting. You develop abdominal pain. Get help right away if: You develop new bowel or bladder control problems. You have unusual weakness or numbness in your arms or legs. You feel faint. These symptoms may represent a serious problem that is an emergency. Do not wait to see if the symptoms will go away. Get medical help right away. Call your local emergency services (911 in the [place] Do not drive yourself to the hospital. Summary Acute back pain is sudden and usually short-lived. Use proper lifting techniques. When you bend and lift, use positions that put less stress on your back. Take over-the-counter and prescription medicines only as told by your health care provider, and apply heat or ice as told. This information is not intended to replace advice given to you by your health care provider. Make sure you discuss any questions you have with your health care provider. Document Revised: 02/14/2021 Document Reviewed: 02/14/2021 Elsevier Patient Education  2024 Elsevier Inc.   If you have been instructed to have an in-person evaluation today at a local Urgent Care facility, please use the link below. It will take you to a list of all of our available [facility], including address, phone number and hours of operation. Please do not delay care.  [facility]  If you or a family member do not have a primary care provider, use the link below to schedule a visit and establish care. When you choose a [facility] primary care physician or advanced practice provider, you gain a long-term partner in health. Find a Primary Care Provider  Learn more about [facility]'s in-office and virtual care options:  - Get Care Now

## 2024-03-17 NOTE — Progress Notes (Signed)
 Virtual Visit Consent   Dawn Davies, you are scheduled for a virtual visit with a [facility] provider today. Just as with appointments in the office, your consent must be obtained to participate. Your consent will be active for this visit and any virtual visit you may have with one of our providers in the next 365 days. If you have a MyChart account, a copy of this consent can be sent to you electronically.  As this is a virtual visit, video technology does not allow for your provider to perform a traditional examination. This may limit your provider's ability to fully assess your condition. If your provider identifies any concerns that need to be evaluated in person or the need to arrange testing (such as labs, EKG, etc.), we will make arrangements to do so. Although advances in technology are sophisticated, we cannot ensure that it will always work on either your end or our end. If the connection with a video visit is poor, the visit may have to be switched to a telephone visit. With either a video or telephone visit, we are not always able to ensure that we have a secure connection.  By engaging in this virtual visit, you consent to the provision of healthcare and authorize for your insurance to be billed (if applicable) for the services provided during this visit. Depending on your insurance coverage, you may receive a charge related to this service.  I need to obtain your verbal consent now. Are you willing to proceed with your visit today? Dawn Davies has provided verbal consent on 03/17/2024 for a virtual visit (video or telephone). Dawn Loveless, PA-C  Date: 03/17/2024 6:11 PM   Virtual Visit via Video Note   I, Dawn Davies, connected with  Dawn Davies  ([id number], 19-Jun-1991) on 03/17/24 at  6:00 PM EDT by a video-enabled telemedicine application and verified that I am speaking with the correct person using two identifiers.  Location: Patient: Virtual Visit Location  Patient: Home Provider: Virtual Visit Location Provider: Home Office   I discussed the limitations of evaluation and management by telemedicine and the availability of in person appointments. The patient expressed understanding and agreed to proceed.    History of Present Illness: Dawn Davies is a 33 y.o. who identifies as a female who was assigned female at birth, and is being seen today for back pain.  HPI: Back Pain This is a new problem. The current episode started today (injured a couple of months ago, reaggravated today- while cleaning house). The problem occurs constantly. The problem is unchanged. The pain is present in the thoracic spine (right). The quality of the pain is described as stabbing and aching. The pain does not radiate. The pain is mild. The pain is The same all the time. The symptoms are aggravated by position. Stiffness is present All day. Pertinent negatives include no bladder incontinence, bowel incontinence, headaches, numbness, paresthesias, pelvic pain, perianal numbness, tingling or weakness. Risk factors include poor posture, obesity, lack of exercise and sedentary lifestyle. She has tried nothing (tried Meloxicam 7.5mg  and Flexeril 5mg  the last time, nothing yet this time) for the symptoms. The treatment provided no relief.      Problems:  Patient Active Problem List   Diagnosis Date Noted   Pre-eclampsia 03/08/2024   Obesity (BMI 30-39.9) 03/08/2024   Encounter for counseling regarding contraception 03/08/2024   History of gestational diabetes 08/13/2022   Encounter for monitoring Subutex maintenance therapy 08/03/2022   GBS (group B Streptococcus carrier), +  RV culture, currently pregnant 04/09/2022   History of hepatitis C 04/08/2022   Vacuum-assisted vaginal delivery 04/07/2022   Thyroid condition 04/07/2022   GDM, class A2 04/07/2022   Echogenic focus of bowel of fetus affecting management of mother in singleton pregnancy, antepartum 02/06/2022    Asthmatic bronchitis, mild intermittent, with acute exacerbation 01/21/2022   Nicotine dependence, cigarettes, uncomplicated 01/21/2022   Anxiety 11/19/2021   Echogenic bowel of fetus on prenatal ultrasound 11/19/2021   Late prenatal care 10/22/2021   Seasonal allergic rhinitis 05/07/2021   Abdominal mass 03/19/2021   Abnormal liver function tests 03/19/2021   Acute pelvic pain 03/19/2021   Menorrhagia with irregular cycle 01/14/2021   Multiple thyroid nodules 01/14/2021   Postpartum depression 04/03/2020   Thyrotoxicosis 02/06/2020   Pregnancy complicated by subutex maintenance, antepartum (HCC) 02/06/2020   Pap smear of cervix with ASCUS, cannot exclude HGSIL 09/01/2019   Opioid dependence (HCC) 08/24/2019   Rh negative, antepartum 08/20/2019   History of pre-eclampsia 08/18/2019   Ovarian cyst 07/24/2019   History of completed stroke 07/12/2019   Smoker 07/12/2019   Moderate episode of recurrent major depressive disorder (HCC) 03/08/2019   Benzodiazepine dependence (HCC) 09/21/2018   Seizure disorder (HCC) 06/22/2017   Generalized anxiety disorder 07/23/2016   Memory loss 03/28/2013   Cerebral vasospasm 03/10/2012   Anemia 03/10/2012   Subarachnoid hemorrhage (HCC) 03/03/2012    Allergies:  Allergies  Allergen Reactions   Morphine Anaphylaxis and Rash   Hydrocodone-Acetaminophen Itching and Other (See Comments)    Reaction not specified by patient Reaction not specified by pat    Naloxone Nausea And Vomiting   Varenicline Other (See Comments)    Xyzal-Headaches   Medications:  Current Outpatient Medications:    cyclobenzaprine (FLEXERIL) 10 MG tablet, Take 0.5-1 tablets (5-10 mg total) by mouth 3 (three) times daily as needed., Disp: 30 tablet, Rfl: 0   Menthol, Topical Analgesic, (BIOFREEZE COOL THE PAIN LARGE) 5 % PTCH, Use 1 patch every 12 hours as needed, Disp: 14 patch, Rfl: 0   naproxen (NAPROSYN) 500 MG tablet, Take 1 tablet (500 mg total) by mouth 2 (two)  times daily with a meal., Disp: 30 tablet, Rfl: 0   ALPRAZolam (XANAX) 1 MG tablet, Take 1 mg by mouth 3 (three) times daily as needed for anxiety., Disp: , Rfl:    buprenorphine (SUBUTEX) 8 MG SUBL SL tablet, Place 24 mg under the tongue daily as needed for withdrawal., Disp: , Rfl:    ferrous sulfate 325 (65 FE) MG EC tablet, Take by mouth., Disp: , Rfl:    gabapentin (NEURONTIN) 600 MG tablet, Take 600 mg by mouth 3 (three) times daily. , Disp: , Rfl:    ibuprofen (ADVIL) 600 MG tablet, Take 1 tablet (600 mg total) by mouth every 6 (six) hours as needed., Disp: 60 tablet, Rfl: 0   loratadine (CLARITIN) 10 MG tablet, Take 1 tablet (10 mg total) by mouth daily., Disp: 90 tablet, Rfl: 3   Norethindrone Acetate-Ethinyl Estradiol (LOESTRIN) 1.5-30 MG-MCG tablet, Take 1 tablet by mouth daily., Disp: 28 tablet, Rfl: 3   Prenatal Vit-Fe Fumarate-FA (M-NATAL PLUS) 27-1 MG TABS, Take 1 tablet by mouth daily., Disp: , Rfl:    VENTOLIN HFA 108 (90 Base) MCG/ACT inhaler, Inhale 1-2 puffs into the lungs every 6 (six) hours as needed for wheezing or shortness of breath., Disp: 18 g, Rfl: 2  Observations/Objective: Patient is well-developed, well-nourished in no acute distress.  Resting comfortably at home.  Head is normocephalic,  atraumatic.  No labored breathing.  Speech is clear and coherent with logical content.  Patient is alert and oriented at baseline.    Assessment and Plan: 1. Acute right-sided thoracic back pain (Primary) - cyclobenzaprine (FLEXERIL) 10 MG tablet; Take 0.5-1 tablets (5-10 mg total) by mouth 3 (three) times daily as needed.  Dispense: 30 tablet; Refill: 0 - naproxen (NAPROSYN) 500 MG tablet; Take 1 tablet (500 mg total) by mouth 2 (two) times daily with a meal.  Dispense: 30 tablet; Refill: 0 - Menthol, Topical Analgesic, (BIOFREEZE COOL THE PAIN LARGE) 5 % PTCH; Use 1 patch every 12 hours as needed  Dispense: 14 patch; Refill: 0  - Suspect muscle spasm - Failed NSAIDs - Add  Medrol dose pack and flexeril - Avoid NSAIDs (Ibuprofen/Advil/Motrin or Naproxen/Aleve) while on steroid - Tylenol if okay for breakthrough pain - Heat to area - Epsom salt soak if able to get in and out of bath tub safely - Back exercises and stretches provided via AVS - Seek in person evaluation if worsening or fails to improve with treatment   Follow Up Instructions: I discussed the assessment and treatment plan with the patient. The patient was provided an opportunity to ask questions and all were answered. The patient agreed with the plan and demonstrated an understanding of the instructions.  A copy of instructions were sent to the patient via MyChart unless otherwise noted below.    The patient was advised to call back or seek an in-person evaluation if the symptoms worsen or if the condition fails to improve as anticipated.    Dawn Loveless, PA-C

## 2024-03-20 ENCOUNTER — Other Ambulatory Visit

## 2024-03-21 ENCOUNTER — Other Ambulatory Visit

## 2024-03-22 ENCOUNTER — Telehealth (HOSPITAL_BASED_OUTPATIENT_CLINIC_OR_DEPARTMENT_OTHER): Payer: Self-pay | Admitting: *Deleted

## 2024-03-22 NOTE — Telephone Encounter (Signed)
 Pt has already left this practice but FYI

## 2024-03-22 NOTE — Telephone Encounter (Signed)
 Copied from CRM [id number]. Topic: Referral - Status >> Mar 22, 2024 11:05 AM Antwanette L wrote: Reason for CRM: Angie from [facility] is calling to let Dr. De [place] know that they will not be seeing the patient any more. According to Angie, the patient wanted medicine that the provider didn't won't to prescribe. Also the treatment plan the patient wanted, the provider was not comfortable with it.

## 2024-03-23 ENCOUNTER — Encounter (HOSPITAL_BASED_OUTPATIENT_CLINIC_OR_DEPARTMENT_OTHER): Payer: Self-pay | Admitting: *Deleted

## 2024-03-23 NOTE — Telephone Encounter (Signed)
 Records request sent to [facility] for [facility]

## 2024-03-24 ENCOUNTER — Encounter (HOSPITAL_BASED_OUTPATIENT_CLINIC_OR_DEPARTMENT_OTHER): Payer: Self-pay | Admitting: Family Medicine

## 2024-03-27 ENCOUNTER — Telehealth: Payer: MEDICAID | Admitting: Physician Assistant

## 2024-03-27 DIAGNOSIS — M546 Pain in thoracic spine: Secondary | ICD-10-CM

## 2024-03-27 DIAGNOSIS — F411 Generalized anxiety disorder: Secondary | ICD-10-CM

## 2024-03-27 MED ORDER — CYCLOBENZAPRINE HCL 10 MG PO TABS: 5.0000 mg | ORAL_TABLET | Freq: Three times a day (TID) | ORAL | 0 refills | Status: DC | PRN

## 2024-03-27 MED ORDER — METHYLPREDNISOLONE 4 MG PO TBPK: ORAL_TABLET | ORAL | 0 refills | Status: DC

## 2024-03-27 NOTE — Progress Notes (Signed)
 Virtual Visit Consent   Dawn Davies, you are scheduled for a virtual visit with a [facility] provider today. Just as with appointments in the office, your consent must be obtained to participate. Your consent will be active for this visit and any virtual visit you may have with one of our providers in the next 365 days. If you have a MyChart account, a copy of this consent can be sent to you electronically.  As this is a virtual visit, video technology does not allow for your provider to perform a traditional examination. This may limit your provider's ability to fully assess your condition. If your provider identifies any concerns that need to be evaluated in person or the need to arrange testing (such as labs, EKG, etc.), we will make arrangements to do so. Although advances in technology are sophisticated, we cannot ensure that it will always work on either your end or our end. If the connection with a video visit is poor, the visit may have to be switched to a telephone visit. With either a video or telephone visit, we are not always able to ensure that we have a secure connection.  By engaging in this virtual visit, you consent to the provision of healthcare and authorize for your insurance to be billed (if applicable) for the services provided during this visit. Depending on your insurance coverage, you may receive a charge related to this service.  I need to obtain your verbal consent now. Are you willing to proceed with your visit today? Dawn Davies has provided verbal consent on 03/27/2024 for a virtual visit (video or telephone). Angelia Kelp, PA-C  Date: 03/27/2024 1:19 PM   Virtual Visit via Video Note   I, Angelia Kelp, connected with  Dawn Davies  ([id number], 10-14-1991) on 03/27/24 at 12:15 PM EDT by a video-enabled telemedicine application and verified that I am speaking with the correct person using two identifiers.  Location: Patient: Virtual Visit Location  Patient: Home Provider: Virtual Visit Location Provider: Home Office   I discussed the limitations of evaluation and management by telemedicine and the availability of in person appointments. The patient expressed understanding and agreed to proceed.    History of Present Illness: Dawn Davies is a 33 y.o. who identifies as a female who was assigned female at birth, and is being seen today for continued back pain. Seen virtually on 03/17/24 for same issue. Having continued pain in the right thoracic area between the right shoulder blade and the spine. She has been trying Naproxen  and Flexeril . Affects her sleep. Has to lift her 34 year old son and constantly aggravates it. Pain is reported as severe. Deep breathing and coughing/laughing cause pain as well. Denies shortness of breath or difficulty breathing. Denies any rash.  Also, having increased anxiety symptoms. Has been out of her Alprazolam  for at least 34 week or more. She would like to go back on her Diazepam  as it worked better, and for longer periods of time.    Problems:  Patient Active Problem List   Diagnosis Date Noted   Pre-eclampsia 03/08/2024   Obesity (BMI 30-39.9) 03/08/2024   Encounter for counseling regarding contraception 03/08/2024   History of gestational diabetes 08/13/2022   Encounter for monitoring Subutex  maintenance therapy 08/03/2022   GBS (group B Streptococcus carrier), +RV culture, currently pregnant 04/09/2022   History of hepatitis C 04/08/2022   Vacuum-assisted vaginal delivery 04/07/2022   Thyroid  condition 04/07/2022   GDM, class A2 04/07/2022   Echogenic  focus of bowel of fetus affecting management of mother in singleton pregnancy, antepartum 02/06/2022   Asthmatic bronchitis, mild intermittent, with acute exacerbation 01/21/2022   Nicotine  dependence, cigarettes, uncomplicated 01/21/2022   Anxiety 11/19/2021   Echogenic bowel of fetus on prenatal ultrasound 11/19/2021   Late prenatal care 10/22/2021    Seasonal allergic rhinitis 05/07/2021   Abdominal mass 03/19/2021   Abnormal liver function tests 03/19/2021   Acute pelvic pain 03/19/2021   Menorrhagia with irregular cycle 01/14/2021   Multiple thyroid  nodules 01/14/2021   Postpartum depression 04/03/2020   Thyrotoxicosis 02/06/2020   Pregnancy complicated by subutex  maintenance, antepartum (HCC) 02/06/2020   Pap smear of cervix with ASCUS, cannot exclude HGSIL 09/01/2019   Opioid dependence (HCC) 08/24/2019   Rh negative, antepartum 08/20/2019   History of pre-eclampsia 08/18/2019   Ovarian cyst 07/24/2019   History of completed stroke 07/12/2019   Smoker 07/12/2019   Moderate episode of recurrent major depressive disorder (HCC) 03/08/2019   Benzodiazepine dependence (HCC) 09/21/2018   Seizure disorder (HCC) 06/22/2017   Generalized anxiety disorder 07/23/2016   Memory loss 03/28/2013   Cerebral vasospasm 03/10/2012   Anemia 03/10/2012   Subarachnoid hemorrhage (HCC) 03/03/2012    Allergies:  Allergies  Allergen Reactions   Morphine Anaphylaxis and Rash   Hydrocodone-Acetaminophen  Itching and Other (See Comments)    Reaction not specified by patient Reaction not specified by pat    Naloxone Nausea And Vomiting   Varenicline Other (See Comments)    Xyzal-Headaches   Medications:  Current Outpatient Medications:    methylPREDNISolone  (MEDROL  DOSEPAK) 4 MG TBPK tablet, 6 day taper; take as directed on package instructions, Disp: 21 tablet, Rfl: 0   ALPRAZolam  (XANAX ) 1 MG tablet, Take 1 mg by mouth 3 (three) times daily as needed for anxiety., Disp: , Rfl:    buprenorphine  (SUBUTEX ) 8 MG SUBL SL tablet, Place 24 mg under the tongue daily as needed for withdrawal., Disp: , Rfl:    cyclobenzaprine  (FLEXERIL ) 10 MG tablet, Take 0.5-1 tablets (5-10 mg total) by mouth 3 (three) times daily as needed., Disp: 30 tablet, Rfl: 0   ferrous sulfate  325 (65 FE) MG EC tablet, Take by mouth., Disp: , Rfl:    gabapentin  (NEURONTIN )  600 MG tablet, Take 600 mg by mouth 3 (three) times daily. , Disp: , Rfl:    ibuprofen  (ADVIL ) 600 MG tablet, Take 1 tablet (600 mg total) by mouth every 6 (six) hours as needed., Disp: 60 tablet, Rfl: 0   loratadine  (CLARITIN ) 10 MG tablet, Take 1 tablet (10 mg total) by mouth daily., Disp: 90 tablet, Rfl: 3   Menthol , Topical Analgesic, (BIOFREEZE COOL THE PAIN LARGE) 5 % PTCH, Use 1 patch every 12 hours as needed, Disp: 14 patch, Rfl: 0   naproxen  (NAPROSYN ) 500 MG tablet, Take 1 tablet (500 mg total) by mouth 2 (two) times daily with a meal., Disp: 30 tablet, Rfl: 0   Norethindrone  Acetate-Ethinyl Estradiol  (LOESTRIN) 1.5-30 MG-MCG tablet, Take 1 tablet by mouth daily., Disp: 28 tablet, Rfl: 3   Prenatal Vit-Fe Fumarate-FA (M-NATAL PLUS) 27-1 MG TABS, Take 1 tablet by mouth daily., Disp: , Rfl:    VENTOLIN  HFA 108 (90 Base) MCG/ACT inhaler, Inhale 1-2 puffs into the lungs every 6 (six) hours as needed for wheezing or shortness of breath., Disp: 18 g, Rfl: 2  Observations/Objective: Patient is well-developed, well-nourished in no acute distress.  Resting comfortably at home.  Head is normocephalic, atraumatic.  No labored breathing.  Speech is clear  and coherent with logical content.  Patient is alert and oriented at baseline.    Assessment and Plan: 1. Acute right-sided thoracic back pain - methylPREDNISolone  (MEDROL  DOSEPAK) 4 MG TBPK tablet; 6 day taper; take as directed on package instructions  Dispense: 21 tablet; Refill: 0 - cyclobenzaprine  (FLEXERIL ) 10 MG tablet; Take 0.5-1 tablets (5-10 mg total) by mouth 3 (three) times daily as needed.  Dispense: 30 tablet; Refill: 0  2. GAD (generalized anxiety disorder) (Primary)  - Suspect thoracic back strain vs trigger point - Failed NSAIDs - Add Medrol  dose pack and flexeril  - Avoid NSAIDs (Ibuprofen /Advil /Motrin  or Naproxen /Aleve ) while on steroid - Tylenol  if okay for breakthrough pain - Heat to area - Epsom salt soak if able to  get in and out of bath tub safely - Back exercises and stretches provided via AVS - Seek in person evaluation if worsening or fails to improve with treatment  - Discussed [facility] for acute exacerbation of GAD, info provided in AVS   Follow Up Instructions: I discussed the assessment and treatment plan with the patient. The patient was provided an opportunity to ask questions and all were answered. The patient agreed with the plan and demonstrated an understanding of the instructions.  A copy of instructions were sent to the patient via MyChart unless otherwise noted below.    The patient was advised to call back or seek an in-person evaluation if the symptoms worsen or if the condition fails to improve as anticipated.    Angelia Kelp, PA-C

## 2024-03-27 NOTE — Patient Instructions (Addendum)
 Dawn Davies, thank you for joining Angelia Kelp, PA-C for today's virtual visit.  While this provider is not your primary care provider (PCP), if your PCP is located in our provider database this encounter information will be shared with them immediately following your visit.   A [facility] MyChart account gives you access to today's visit and all your visits, tests, and labs performed at [facility] " click here if you don't have a [facility] MyChart account or go to [email or web address]  Consent: (Patient) Dawn Davies provided verbal consent for this virtual visit at the beginning of the encounter.  Current Medications:  Current Outpatient Medications:    methylPREDNISolone  (MEDROL  DOSEPAK) 4 MG TBPK tablet, 6 day taper; take as directed on package instructions, Disp: 21 tablet, Rfl: 0   ALPRAZolam  (XANAX ) 1 MG tablet, Take 1 mg by mouth 3 (three) times daily as needed for anxiety., Disp: , Rfl:    buprenorphine  (SUBUTEX ) 8 MG SUBL SL tablet, Place 24 mg under the tongue daily as needed for withdrawal., Disp: , Rfl:    cyclobenzaprine  (FLEXERIL ) 10 MG tablet, Take 0.5-1 tablets (5-10 mg total) by mouth 3 (three) times daily as needed., Disp: 30 tablet, Rfl: 0   ferrous sulfate  325 (65 FE) MG EC tablet, Take by mouth., Disp: , Rfl:    gabapentin  (NEURONTIN ) 600 MG tablet, Take 600 mg by mouth 3 (three) times daily. , Disp: , Rfl:    ibuprofen  (ADVIL ) 600 MG tablet, Take 1 tablet (600 mg total) by mouth every 6 (six) hours as needed., Disp: 60 tablet, Rfl: 0   loratadine  (CLARITIN ) 10 MG tablet, Take 1 tablet (10 mg total) by mouth daily., Disp: 90 tablet, Rfl: 3   Menthol , Topical Analgesic, (BIOFREEZE COOL THE PAIN LARGE) 5 % PTCH, Use 1 patch every 12 hours as needed, Disp: 14 patch, Rfl: 0   naproxen  (NAPROSYN ) 500 MG tablet, Take 1 tablet (500 mg total) by mouth 2 (two) times daily with a meal., Disp: 30 tablet, Rfl: 0   Norethindrone  Acetate-Ethinyl Estradiol   (LOESTRIN) 1.5-30 MG-MCG tablet, Take 1 tablet by mouth daily., Disp: 28 tablet, Rfl: 3   Prenatal Vit-Fe Fumarate-FA (M-NATAL PLUS) 27-1 MG TABS, Take 1 tablet by mouth daily., Disp: , Rfl:    VENTOLIN  HFA 108 (90 Base) MCG/ACT inhaler, Inhale 1-2 puffs into the lungs every 6 (six) hours as needed for wheezing or shortness of breath., Disp: 18 g, Rfl: 2   Medications ordered in this encounter:  Meds ordered this encounter  Medications   methylPREDNISolone  (MEDROL  DOSEPAK) 4 MG TBPK tablet    Sig: 6 day taper; take as directed on package instructions    Dispense:  21 tablet    Refill:  0    Supervising Provider:   LAMPTEY, PHILIP O [[id number]]   cyclobenzaprine  (FLEXERIL ) 10 MG tablet    Sig: Take 0.5-1 tablets (5-10 mg total) by mouth 3 (three) times daily as needed.    Dispense:  30 tablet    Refill:  0    Supervising Provider:   LAMPTEY, PHILIP O [[id number]]     *If you need refills on other medications prior to your next appointment, please contact your pharmacy*  Follow-Up: Call back or seek an in-person evaluation if the symptoms worsen or if the condition fails to improve as anticipated.  [facility] [phone]  Other Instructions [facility] Phone: [phone] Address: [address] [place], [place] [address] Hours: Open 24/7,  No appointment required.  Back Exercises The following exercises strengthen the muscles that help to support the trunk (torso) and back. They also help to keep the lower back flexible. Doing these exercises can help to prevent or lessen existing low back pain. If you have back pain or discomfort, try doing these exercises 2-3 times each day or as told by your health care provider. As your pain improves, do them once each day, but increase the number of times that you repeat the steps for each exercise (do more repetitions). To prevent the recurrence of back pain, continue to do these exercises once each day  or as told by your health care provider. Do exercises exactly as told by your health care provider and adjust them as directed. It is normal to feel mild stretching, pulling, tightness, or discomfort as you do these exercises, but you should stop right away if you feel sudden pain or your pain gets worse. Exercises Single knee to chest Repeat these steps 3-5 times for each leg: Lie on your back on a firm bed or the floor with your legs extended. Bring one knee to your chest. Your other leg should stay extended and in contact with the floor. Hold your knee in place by grabbing your knee or thigh with both hands and hold. Pull on your knee until you feel a gentle stretch in your lower back or buttocks. Hold the stretch for 10-30 seconds. Slowly release and straighten your leg.  Pelvic tilt Repeat these steps 5-10 times: Lie on your back on a firm bed or the floor with your legs extended. Bend your knees so they are pointing toward the ceiling and your feet are flat on the floor. Tighten your lower abdominal muscles to press your lower back against the floor. This motion will tilt your pelvis so your tailbone points up toward the ceiling instead of pointing to your feet or the floor. With gentle tension and even breathing, hold this position for 5-10 seconds.  Cat-cow Repeat these steps until your lower back becomes more flexible: Get into a hands-and-knees position on a firm bed or the floor. Keep your hands under your shoulders, and keep your knees under your hips. You may place padding under your knees for comfort. Let your head hang down toward your chest. Contract your abdominal muscles and point your tailbone toward the floor so your lower back becomes rounded like the back of a cat. Hold this position for 5 seconds. Slowly lift your head, let your abdominal muscles relax, and point your tailbone up toward the ceiling so your back forms a sagging arch like the back of a cow. Hold this  position for 5 seconds.  Press-ups Repeat these steps 5-10 times: Lie on your abdomen (face-down) on a firm bed or the floor. Place your palms near your head, about shoulder-width apart. Keeping your back as relaxed as possible and keeping your hips on the floor, slowly straighten your arms to raise the top half of your body and lift your shoulders. Do not use your back muscles to raise your upper torso. You may adjust the placement of your hands to make yourself more comfortable. Hold this position for 5 seconds while you keep your back relaxed. Slowly return to lying flat on the floor.  Bridges Repeat these steps 10 times: Lie on your back on a firm bed or the floor. Bend your knees so they are pointing toward the ceiling and your feet are flat on the floor.  Your arms should be flat at your sides, next to your body. Tighten your buttocks muscles and lift your buttocks off the floor until your waist is at almost the same height as your knees. You should feel the muscles working in your buttocks and the back of your thighs. If you do not feel these muscles, slide your feet 1-2 inches (2.5-5 cm) farther away from your buttocks. Hold this position for 3-5 seconds. Slowly lower your hips to the starting position, and allow your buttocks muscles to relax completely. If this exercise is too easy, try doing it with your arms crossed over your chest. Abdominal crunches Repeat these steps 5-10 times: Lie on your back on a firm bed or the floor with your legs extended. Bend your knees so they are pointing toward the ceiling and your feet are flat on the floor. Cross your arms over your chest. Tip your chin slightly toward your chest without bending your neck. Tighten your abdominal muscles and slowly raise your torso high enough to lift your shoulder blades a tiny bit off the floor. Avoid raising your torso higher than that because it can put too much stress on your lower back and does not help to  strengthen your abdominal muscles. Slowly return to your starting position.  Back lifts Repeat these steps 5-10 times: Lie on your abdomen (face-down) with your arms at your sides, and rest your forehead on the floor. Tighten the muscles in your legs and your buttocks. Slowly lift your chest off the floor while you keep your hips pressed to the floor. Keep the back of your head in line with the curve in your back. Your eyes should be looking at the floor. Hold this position for 3-5 seconds. Slowly return to your starting position.  Contact a health care provider if: Your back pain or discomfort gets much worse when you do an exercise. Your worsening back pain or discomfort does not lessen within 2 hours after you exercise. If you have any of these problems, stop doing these exercises right away. Do not do them again unless your health care provider says that you can. Get help right away if: You develop sudden, severe back pain. If this happens, stop doing the exercises right away. Do not do them again unless your health care provider says that you can. This information is not intended to replace advice given to you by your health care provider. Make sure you discuss any questions you have with your health care provider. Document Revised: 12/27/2022 Document Reviewed: 02/05/2021 Elsevier Patient Education  2024 Elsevier Inc.   If you have been instructed to have an in-person evaluation today at a local Urgent Care facility, please use the link below. It will take you to a list of all of our available [facility], including address, phone number and hours of operation. Please do not delay care.  [facility]  If you or a family member do not have a primary care provider, use the link below to schedule a visit and establish care. When you choose a [facility] primary care physician or advanced practice provider, you gain a long-term partner in health. Find a Primary  Care Provider  Learn more about [facility]'s in-office and virtual care options: [facility] - Get Care Now

## 2024-03-28 ENCOUNTER — Ambulatory Visit
Admission: RE | Admit: 2024-03-28 | Discharge: 2024-03-28 | Disposition: A | Discharge status: Home / Self Care | Payer: MEDICAID | Source: Ambulatory Visit | Attending: Family Medicine | Admitting: Family Medicine

## 2024-03-28 DIAGNOSIS — E042 Nontoxic multinodular goiter: Secondary | ICD-10-CM

## 2024-03-30 NOTE — Telephone Encounter (Signed)
 Patient mychart message sent in separate encounter

## 2024-04-05 ENCOUNTER — Ambulatory Visit (HOSPITAL_BASED_OUTPATIENT_CLINIC_OR_DEPARTMENT_OTHER): Admitting: Family Medicine

## 2024-04-13 ENCOUNTER — Other Ambulatory Visit (INDEPENDENT_AMBULATORY_CARE_PROVIDER_SITE_OTHER): Payer: Self-pay | Admitting: Physician Assistant

## 2024-04-13 ENCOUNTER — Encounter (HOSPITAL_BASED_OUTPATIENT_CLINIC_OR_DEPARTMENT_OTHER): Payer: Self-pay | Admitting: Family Medicine

## 2024-04-13 DIAGNOSIS — M546 Pain in thoracic spine: Secondary | ICD-10-CM

## 2024-04-26 ENCOUNTER — Encounter: Payer: Self-pay | Admitting: Physician Assistant

## 2024-04-26 ENCOUNTER — Telehealth: Payer: MEDICAID | Admitting: Physician Assistant

## 2024-04-26 DIAGNOSIS — J069 Acute upper respiratory infection, unspecified: Secondary | ICD-10-CM | POA: Diagnosis not present

## 2024-04-26 DIAGNOSIS — F411 Generalized anxiety disorder: Secondary | ICD-10-CM

## 2024-04-26 MED ORDER — HYDROXYZINE PAMOATE 25 MG PO CAPS: 25.0000 mg | ORAL_CAPSULE | Freq: Three times a day (TID) | ORAL | 0 refills | Status: DC | PRN

## 2024-04-26 MED ORDER — HYDROXYZINE PAMOATE 25 MG PO CAPS
25.0000 mg | ORAL_CAPSULE | Freq: Three times a day (TID) | ORAL | 0 refills | Status: DC | PRN
Start: 2024-04-26 — End: 2024-09-12

## 2024-04-26 MED ORDER — ONDANSETRON 4 MG PO TBDP: 4.0000 mg | ORAL_TABLET | Freq: Three times a day (TID) | ORAL | 0 refills | Status: DC | PRN

## 2024-04-26 MED ORDER — FLUTICASONE PROPIONATE 50 MCG/ACT NA SUSP: 2.0000 | Freq: Every day | NASAL | 0 refills | Status: AC

## 2024-04-26 MED ORDER — PSEUDOEPH-BROMPHEN-DM 30-2-10 MG/5ML PO SYRP: 5.0000 mL | ORAL_SOLUTION | Freq: Four times a day (QID) | ORAL | 0 refills | Status: DC | PRN

## 2024-04-26 MED ORDER — ALBUTEROL SULFATE HFA 108 (90 BASE) MCG/ACT IN AERS: 1.0000 | INHALATION_SPRAY | Freq: Four times a day (QID) | RESPIRATORY_TRACT | 0 refills | Status: DC | PRN

## 2024-04-26 MED ORDER — ALBUTEROL SULFATE HFA 108 (90 BASE) MCG/ACT IN AERS
1.0000 | INHALATION_SPRAY | Freq: Four times a day (QID) | RESPIRATORY_TRACT | 0 refills | Status: DC | PRN
Start: 2024-04-26 — End: 2024-04-26

## 2024-04-26 NOTE — Progress Notes (Signed)
 Virtual Visit Consent   Dawn Davies, you are scheduled for a virtual visit with a [facility] provider today. Just as with appointments in the office, your consent must be obtained to participate. Your consent will be active for this visit and any virtual visit you may have with one of our providers in the next 365 days. If you have a MyChart account, a copy of this consent can be sent to you electronically.  As this is a virtual visit, video technology does not allow for your provider to perform a traditional examination. This may limit your provider's ability to fully assess your condition. If your provider identifies any concerns that need to be evaluated in person or the need to arrange testing (such as labs, EKG, etc.), we will make arrangements to do so. Although advances in technology are sophisticated, we cannot ensure that it will always work on either your end or our end. If the connection with a video visit is poor, the visit may have to be switched to a telephone visit. With either a video or telephone visit, we are not always able to ensure that we have a secure connection.  By engaging in this virtual visit, you consent to the provision of healthcare and authorize for your insurance to be billed (if applicable) for the services provided during this visit. Depending on your insurance coverage, you may receive a charge related to this service.  I need to obtain your verbal consent now. Are you willing to proceed with your visit today? Dawn Davies has provided verbal consent on 04/26/2024 for a virtual visit (video or telephone). Angelia Kelp, PA-C  Date: 04/26/2024 1:56 PM   Virtual Visit via Video Note   I, Angelia Kelp, connected with  Dawn Davies  ([id number], 01-18-1991) on 04/26/24 at  1:30 PM EDT by a video-enabled telemedicine application and verified that I am speaking with the correct person using two identifiers.  Location: Patient: Virtual Visit Location  Patient: Home Provider: Virtual Visit Location Provider: Home Office   I discussed the limitations of evaluation and management by telemedicine and the availability of in person appointments. The patient expressed understanding and agreed to proceed.    History of Present Illness: Dawn Davies is a 33 y.o. who identifies as a female who was assigned female at birth, and is being seen today for URI symptoms and anxiety.  HPI: URI  This is a new problem. The current episode started today. The problem has been gradually worsening. There has been no fever. Associated symptoms include congestion, coughing, headaches, nausea, rhinorrhea, sinus pain, a sore throat (mild) and wheezing. Pertinent negatives include no diarrhea, ear pain, plugged ear sensation or vomiting. Associated symptoms comments: Shortness of breath, anxiety due to not breathing well, reports lungs feel sore in her back. She has tried increased fluids (dayquil cold and flu) for the symptoms. The treatment provided no relief.   Son's Father was sick last week with a low-grade fever.   Has had anxiety but URI symptoms are exacerbating. Has had Alprazolam  1mg  in the past, but is out of this medication. Is not scheduled to see new Psychiatrist until 1st week of June.   Problems:  Patient Active Problem List   Diagnosis Date Noted   Pre-eclampsia 03/08/2024   Obesity (BMI 30-39.9) 03/08/2024   Encounter for counseling regarding contraception 03/08/2024   History of gestational diabetes 08/13/2022   Encounter for monitoring Subutex  maintenance therapy 08/03/2022   GBS (group B Streptococcus carrier), +RV  culture, currently pregnant 04/09/2022   History of hepatitis C 04/08/2022   Vacuum-assisted vaginal delivery 04/07/2022   Thyroid  condition 04/07/2022   GDM, class A2 04/07/2022   Echogenic focus of bowel of fetus affecting management of mother in singleton pregnancy, antepartum 02/06/2022   Asthmatic bronchitis, mild  intermittent, with acute exacerbation 01/21/2022   Nicotine  dependence, cigarettes, uncomplicated 01/21/2022   Anxiety 11/19/2021   Echogenic bowel of fetus on prenatal ultrasound 11/19/2021   Late prenatal care 10/22/2021   Seasonal allergic rhinitis 05/07/2021   Abdominal mass 03/19/2021   Abnormal liver function tests 03/19/2021   Acute pelvic pain 03/19/2021   Menorrhagia with irregular cycle 01/14/2021   Multiple thyroid  nodules 01/14/2021   Postpartum depression 04/03/2020   Thyrotoxicosis 02/06/2020   Pregnancy complicated by subutex  maintenance, antepartum (HCC) 02/06/2020   Pap smear of cervix with ASCUS, cannot exclude HGSIL 09/01/2019   Opioid dependence (HCC) 08/24/2019   Rh negative, antepartum 08/20/2019   History of pre-eclampsia 08/18/2019   Ovarian cyst 07/24/2019   History of completed stroke 07/12/2019   Smoker 07/12/2019   Moderate episode of recurrent major depressive disorder (HCC) 03/08/2019   Benzodiazepine dependence (HCC) 09/21/2018   Seizure disorder (HCC) 06/22/2017   Generalized anxiety disorder 07/23/2016   Memory loss 03/28/2013   Cerebral vasospasm 03/10/2012   Anemia 03/10/2012   Subarachnoid hemorrhage (HCC) 03/03/2012    Allergies:  Allergies  Allergen Reactions   Morphine Anaphylaxis and Rash   Hydrocodone-Acetaminophen  Itching and Other (See Comments)    Reaction not specified by patient Reaction not specified by pat    Naloxone Nausea And Vomiting   Varenicline Other (See Comments)    headaches   Xyzal [Levocetirizine] Anxiety    Panic attack   Medications:  Current Outpatient Medications:    albuterol  (VENTOLIN  HFA) 108 (90 Base) MCG/ACT inhaler, Inhale 1-2 puffs into the lungs every 6 (six) hours as needed., Disp: 8 g, Rfl: 0   brompheniramine-pseudoephedrine-DM 30-2-10 MG/5ML syrup, Take 5 mLs by mouth 4 (four) times daily as needed., Disp: 120 mL, Rfl: 0   fluticasone (FLONASE) 50 MCG/ACT nasal spray, Place 2 sprays into both  nostrils daily., Disp: 16 g, Rfl: 0   hydrOXYzine (VISTARIL) 25 MG capsule, Take 1 capsule (25 mg total) by mouth every 8 (eight) hours as needed., Disp: 30 capsule, Rfl: 0   ondansetron  (ZOFRAN -ODT) 4 MG disintegrating tablet, Take 1 tablet (4 mg total) by mouth every 8 (eight) hours as needed., Disp: 20 tablet, Rfl: 0   ALPRAZolam  (XANAX ) 1 MG tablet, Take 1 mg by mouth 3 (three) times daily as needed for anxiety., Disp: , Rfl:    buprenorphine  (SUBUTEX ) 8 MG SUBL SL tablet, Place 24 mg under the tongue daily as needed for withdrawal., Disp: , Rfl:    cyclobenzaprine  (FLEXERIL ) 10 MG tablet, Take 0.5-1 tablets (5-10 mg total) by mouth 3 (three) times daily as needed., Disp: 30 tablet, Rfl: 0   ferrous sulfate  325 (65 FE) MG EC tablet, Take by mouth., Disp: , Rfl:    gabapentin  (NEURONTIN ) 600 MG tablet, Take 600 mg by mouth 3 (three) times daily. , Disp: , Rfl:    ibuprofen  (ADVIL ) 600 MG tablet, Take 1 tablet (600 mg total) by mouth every 6 (six) hours as needed., Disp: 60 tablet, Rfl: 0   loratadine  (CLARITIN ) 10 MG tablet, Take 1 tablet (10 mg total) by mouth daily., Disp: 90 tablet, Rfl: 3   Menthol , Topical Analgesic, (BIOFREEZE COOL THE PAIN LARGE) 5 % PTCH,  Use 1 patch every 12 hours as needed, Disp: 14 patch, Rfl: 0   methylPREDNISolone  (MEDROL  DOSEPAK) 4 MG TBPK tablet, 6 day taper; take as directed on package instructions, Disp: 21 tablet, Rfl: 0   naproxen  (NAPROSYN ) 500 MG tablet, Take 1 tablet (500 mg total) by mouth 2 (two) times daily with a meal., Disp: 30 tablet, Rfl: 0   Norethindrone  Acetate-Ethinyl Estradiol  (LOESTRIN) 1.5-30 MG-MCG tablet, Take 1 tablet by mouth daily., Disp: 28 tablet, Rfl: 3   Prenatal Vit-Fe Fumarate-FA (M-NATAL PLUS) 27-1 MG TABS, Take 1 tablet by mouth daily., Disp: , Rfl:   Observations/Objective: Patient is well-developed, well-nourished in no acute distress.  Resting comfortably at home.  Head is normocephalic, atraumatic.  No labored breathing.   Speech is clear and coherent with logical content.  Patient is alert and oriented at baseline.    Assessment and Plan: 1. Viral URI with cough (Primary) - fluticasone (FLONASE) 50 MCG/ACT nasal spray; Place 2 sprays into both nostrils daily.  Dispense: 16 g; Refill: 0 - brompheniramine-pseudoephedrine-DM 30-2-10 MG/5ML syrup; Take 5 mLs by mouth 4 (four) times daily as needed.  Dispense: 120 mL; Refill: 0 - albuterol  (VENTOLIN  HFA) 108 (90 Base) MCG/ACT inhaler; Inhale 1-2 puffs into the lungs every 6 (six) hours as needed.  Dispense: 8 g; Refill: 0 - ondansetron  (ZOFRAN -ODT) 4 MG disintegrating tablet; Take 1 tablet (4 mg total) by mouth every 8 (eight) hours as needed.  Dispense: 20 tablet; Refill: 0  2. GAD (generalized anxiety disorder) - hydrOXYzine (VISTARIL) 25 MG capsule; Take 1 capsule (25 mg total) by mouth every 8 (eight) hours as needed.  Dispense: 30 capsule; Refill: 0  - Suspect viral URI - Advised could take Flu + Covid test at home, if positive to follow up - Symptomatic medications of choice over the counter as needed - Added Flonase for sinus/nasal congestion - Bromfed DM for cough - Albuterol  refilled for wheezing, chest tightness, and shortness of breath - Zofran  for nausea - Hydroxyzine for anxiety - Push fluids - Rest - Seek further evaluation if symptoms change or worsen   Follow Up Instructions: I discussed the assessment and treatment plan with the patient. The patient was provided an opportunity to ask questions and all were answered. The patient agreed with the plan and demonstrated an understanding of the instructions.  A copy of instructions were sent to the patient via MyChart unless otherwise noted below.    The patient was advised to call back or seek an in-person evaluation if the symptoms worsen or if the condition fails to improve as anticipated.    Angelia Kelp, PA-C

## 2024-04-26 NOTE — Addendum Note (Signed)
 Addended by: Angelia Kelp on: 04/26/2024 02:42 PM   Modules accepted: Orders

## 2024-04-26 NOTE — Patient Instructions (Signed)
 Dawn Davies, thank you for joining Angelia Kelp, PA-C for today's virtual visit.  While this provider is not your primary care provider (PCP), if your PCP is located in our provider database this encounter information will be shared with them immediately following your visit.   A [facility] MyChart account gives you access to today's visit and all your visits, tests, and labs performed at [facility] " click here if you don't have a [facility] MyChart account or go to [email or web address]  Consent: (Patient) Dawn Davies provided verbal consent for this virtual visit at the beginning of the encounter.  Current Medications:  Current Outpatient Medications:    albuterol  (VENTOLIN  HFA) 108 (90 Base) MCG/ACT inhaler, Inhale 1-2 puffs into the lungs every 6 (six) hours as needed., Disp: 8 g, Rfl: 0   brompheniramine-pseudoephedrine-DM 30-2-10 MG/5ML syrup, Take 5 mLs by mouth 4 (four) times daily as needed., Disp: 120 mL, Rfl: 0   fluticasone (FLONASE) 50 MCG/ACT nasal spray, Place 2 sprays into both nostrils daily., Disp: 16 g, Rfl: 0   hydrOXYzine (VISTARIL) 25 MG capsule, Take 1 capsule (25 mg total) by mouth every 8 (eight) hours as needed., Disp: 30 capsule, Rfl: 0   ondansetron  (ZOFRAN -ODT) 4 MG disintegrating tablet, Take 1 tablet (4 mg total) by mouth every 8 (eight) hours as needed., Disp: 20 tablet, Rfl: 0   ALPRAZolam  (XANAX ) 1 MG tablet, Take 1 mg by mouth 3 (three) times daily as needed for anxiety., Disp: , Rfl:    buprenorphine  (SUBUTEX ) 8 MG SUBL SL tablet, Place 24 mg under the tongue daily as needed for withdrawal., Disp: , Rfl:    cyclobenzaprine  (FLEXERIL ) 10 MG tablet, Take 0.5-1 tablets (5-10 mg total) by mouth 3 (three) times daily as needed., Disp: 30 tablet, Rfl: 0   ferrous sulfate  325 (65 FE) MG EC tablet, Take by mouth., Disp: , Rfl:    gabapentin  (NEURONTIN ) 600 MG tablet, Take 600 mg by mouth 3 (three) times daily. , Disp: , Rfl:     ibuprofen  (ADVIL ) 600 MG tablet, Take 1 tablet (600 mg total) by mouth every 6 (six) hours as needed., Disp: 60 tablet, Rfl: 0   loratadine  (CLARITIN ) 10 MG tablet, Take 1 tablet (10 mg total) by mouth daily., Disp: 90 tablet, Rfl: 3   Menthol , Topical Analgesic, (BIOFREEZE COOL THE PAIN LARGE) 5 % PTCH, Use 1 patch every 12 hours as needed, Disp: 14 patch, Rfl: 0   methylPREDNISolone  (MEDROL  DOSEPAK) 4 MG TBPK tablet, 6 day taper; take as directed on package instructions, Disp: 21 tablet, Rfl: 0   naproxen  (NAPROSYN ) 500 MG tablet, Take 1 tablet (500 mg total) by mouth 2 (two) times daily with a meal., Disp: 30 tablet, Rfl: 0   Norethindrone  Acetate-Ethinyl Estradiol  (LOESTRIN) 1.5-30 MG-MCG tablet, Take 1 tablet by mouth daily., Disp: 28 tablet, Rfl: 3   Prenatal Vit-Fe Fumarate-FA (M-NATAL PLUS) 27-1 MG TABS, Take 1 tablet by mouth daily., Disp: , Rfl:    Medications ordered in this encounter:  Meds ordered this encounter  Medications   fluticasone (FLONASE) 50 MCG/ACT nasal spray    Sig: Place 2 sprays into both nostrils daily.    Dispense:  16 g    Refill:  0    Supervising Provider:   Corine Dice [[id number]]   brompheniramine-pseudoephedrine-DM 30-2-10 MG/5ML syrup    Sig: Take 5 mLs by mouth 4 (four) times daily as needed.    Dispense:  120 mL    Refill:  0    Supervising Provider:   LAMPTEY, PHILIP O [[id number]]   albuterol  (VENTOLIN  HFA) 108 (90 Base) MCG/ACT inhaler    Sig: Inhale 1-2 puffs into the lungs every 6 (six) hours as needed.    Dispense:  8 g    Refill:  0    Supervising Provider:   Corine Dice [id number]   ondansetron  (ZOFRAN -ODT) 4 MG disintegrating tablet    Sig: Take 1 tablet (4 mg total) by mouth every 8 (eight) hours as needed.    Dispense:  20 tablet    Refill:  0    Supervising Provider:   LAMPTEY, PHILIP O [[id number]]   hydrOXYzine (VISTARIL) 25 MG capsule    Sig: Take 1 capsule (25 mg total) by mouth every 8 (eight) hours as needed.    Dispense:   30 capsule    Refill:  0    Supervising Provider:   Corine Dice [[id number]]     *If you need refills on other medications prior to your next appointment, please contact your pharmacy*  Follow-Up: Call back or seek an in-person evaluation if the symptoms worsen or if the condition fails to improve as anticipated.  [facility] [phone]  Other Instructions  Viral Respiratory Infection A respiratory infection is an illness that affects part of the respiratory system, such as the lungs, nose, or throat. A respiratory infection that is caused by a virus is called a viral respiratory infection. Common types of viral respiratory infections include: A cold. The flu (influenza). A respiratory syncytial virus (RSV) infection. What are the causes? This condition is caused by a virus. The virus may spread through contact with droplets or direct contact with infected people or their mucus or secretions. The virus may spread from person to person (is contagious). What are the signs or symptoms? Symptoms of this condition include: A stuffy or runny nose. A sore throat or cough. Shortness of breath or difficulty breathing. Yellow or green mucus (sputum). Other symptoms may include: A fever. Sweating or chills. Fatigue. Achy muscles. A headache. How is this diagnosed? This condition may be diagnosed based on: Your symptoms. A physical exam. Testing of secretions from the nose or throat. Chest X-ray. How is this treated? This condition may be treated with medicines, such as: Antiviral medicine. This may shorten the length of time a person has symptoms. Expectorants. These make it easier to cough up mucus. Decongestant nasal sprays. Acetaminophen  or NSAIDs, such as ibuprofen , to relieve fever and pain. Antibiotic medicines are not prescribed for viral infections.This is because antibiotics are designed to kill bacteria. They do not kill viruses. Follow these  instructions at home: Managing pain and congestion Take over-the-counter and prescription medicines only as told by your health care provider. If you have a sore throat, gargle with a mixture of salt and water 3-4 times a day or as needed. To make salt water, completely dissolve -1 tsp (3-6 g) of salt in 1 cup (237 mL) of warm water. Use nose drops made from salt water to ease congestion and soften raw skin around your nose. Take 2 tsp (10 mL) of honey at bedtime to lessen coughing at night. Do not give honey to children who are younger than 1 year. Drink enough fluid to keep your urine pale yellow. This helps prevent dehydration and helps loosen up mucus. General instructions  Rest as much as possible. Do not drink alcohol. Do not use any products that contain nicotine  or tobacco.  These products include cigarettes, chewing tobacco, and vaping devices, such as e-cigarettes. If you need help quitting, ask your health care provider. Keep all follow-up visits. This is important. How is this prevented?     Get an annual flu shot. You may get the flu shot in late summer, fall, or winter. Ask your health care provider when you should get your flu shot. Avoid spreading your infection to other people. If you are sick: Wash your hands with soap and water often, especially after you cough or sneeze. Wash for at least 20 seconds. If soap and water are not available, use alcohol-based hand sanitizer. Cover your mouth when you cough. Cover your nose and mouth when you sneeze. Do not share cups or eating utensils. Clean commonly used objects often. Clean commonly touched surfaces. Stay home from work or school as told by your health care provider. Avoid contact with people who are sick during cold and flu season. This is generally fall and winter. Contact a health care provider if: Your symptoms last for 10 days or longer. Your symptoms get worse over time. You have severe sinus pain in your face or  forehead. The glands in your jaw or neck become very swollen. You have shortness of breath. Get help right away if you: Feel pain or pressure in your chest. Have trouble breathing. Faint or feel like you will faint. Have severe and persistent vomiting. Feel confused or disoriented. These symptoms may represent a serious problem that is an emergency. Do not wait to see if the symptoms will go away. Get medical help right away. Call your local emergency services (911 in the [place] Do not drive yourself to the hospital. Summary A respiratory infection is an illness that affects part of the respiratory system, such as the lungs, nose, or throat. A respiratory infection that is caused by a virus is called a viral respiratory infection. Common types of viral respiratory infections include a cold, influenza, and respiratory syncytial virus (RSV) infection. Symptoms of this condition include a stuffy or runny nose, cough, fatigue, achy muscles, sore throat, and fevers or chills. Antibiotic medicines are not prescribed for viral infections. This is because antibiotics are designed to kill bacteria. They are not effective against viruses. This information is not intended to replace advice given to you by your health care provider. Make sure you discuss any questions you have with your health care provider. Document Revised: 02/27/2021 Document Reviewed: 02/27/2021 Elsevier Patient Education  2024 [employer]vier [employer].   If you have been instructed to have an in-person evaluation today at a local Urgent Care facility, please use the link below. It will take you to a list of all of our available [facility], including address, phone number and hours of operation. Please do not delay care.  [facility]  If you or a family member do not have a primary care provider, use the link below to schedule a visit and establish care. When you choose a [facility] primary care physician or advanced  practice provider, you gain a long-term partner in health. Find a Primary Care Provider  Learn more about [facility]'s in-office and virtual care options: [facility] - Get Care Now

## 2024-05-05 ENCOUNTER — Encounter: Payer: Self-pay | Admitting: Family

## 2024-05-11 ENCOUNTER — Ambulatory Visit (INDEPENDENT_AMBULATORY_CARE_PROVIDER_SITE_OTHER): Payer: MEDICAID | Admitting: Family

## 2024-05-11 ENCOUNTER — Encounter: Payer: Self-pay | Admitting: Family

## 2024-05-11 VITALS — BP 128/70 | HR 112 | Temp 98.0°F | Resp 16 | Ht 68.0 in | Wt 212.8 lb

## 2024-05-11 DIAGNOSIS — Z7689 Persons encountering health services in other specified circumstances: Secondary | ICD-10-CM

## 2024-05-11 DIAGNOSIS — F32A Depression, unspecified: Secondary | ICD-10-CM

## 2024-05-11 DIAGNOSIS — Z8673 Personal history of transient ischemic attack (TIA), and cerebral infarction without residual deficits: Secondary | ICD-10-CM | POA: Diagnosis not present

## 2024-05-11 DIAGNOSIS — F418 Other specified anxiety disorders: Secondary | ICD-10-CM | POA: Diagnosis not present

## 2024-05-11 DIAGNOSIS — Z23 Encounter for immunization: Secondary | ICD-10-CM

## 2024-05-11 NOTE — Progress Notes (Signed)
 Anxiety since she had a stroke. Patient scored 19 on GAD7

## 2024-05-11 NOTE — Progress Notes (Signed)
 Subjective:    Dawn Davies - 33 y.o. female MRN [id number]  Date of birth: 1991/11/02  HPI  Dawn Davies is to establish care.  Current issues and/or concerns: - States stroke in 2013 and none since then. She is not currently taking any medication related to history of stroke. States she has not followed-up with Neurology in several years and needs referral.  - Anxiety depression related to medical history and life balance. She is established with Psychiatry. States they told her to come to Primary Care for Alprazolam  prescription as they will not prescribe the same. States her friend has been giving her Alprazolam . States she has tried several anxiety depression medications in the past and none helped. She denies thoughts of self-harm, suicidal ideations, homicidal ideations. - No further issues/concerns for discussion today.   ROS per HPI    Health Maintenance:  Health Maintenance Due  Topic Date Due   COVID-19 Vaccine (1) Never done     Past Medical History: Patient Active Problem List   Diagnosis Date Noted   Pre-eclampsia 03/08/2024   Obesity (BMI 30-39.9) 03/08/2024   Encounter for counseling regarding contraception 03/08/2024   History of gestational diabetes 08/13/2022   Encounter for monitoring Subutex  maintenance therapy 08/03/2022   GBS (group B Streptococcus carrier), +RV culture, currently pregnant 04/09/2022   History of hepatitis C 04/08/2022   Vacuum-assisted vaginal delivery 04/07/2022   Thyroid  condition 04/07/2022   GDM, class A2 04/07/2022   Echogenic focus of bowel of fetus affecting management of mother in singleton pregnancy, antepartum 02/06/2022   Asthmatic bronchitis, mild intermittent, with acute exacerbation 01/21/2022   Nicotine  dependence, cigarettes, uncomplicated 01/21/2022   Anxiety 11/19/2021   Echogenic bowel of fetus on prenatal ultrasound 11/19/2021   Late prenatal care 10/22/2021   Seasonal allergic rhinitis 05/07/2021   Abdominal  mass 03/19/2021   Abnormal liver function tests 03/19/2021   Acute pelvic pain 03/19/2021   Menorrhagia with irregular cycle 01/14/2021   Multiple thyroid  nodules 01/14/2021   Postpartum depression 04/03/2020   Thyrotoxicosis 02/06/2020   Pregnancy complicated by subutex  maintenance, antepartum (HCC) 02/06/2020   Pap smear of cervix with ASCUS, cannot exclude HGSIL 09/01/2019   Opioid dependence (HCC) 08/24/2019   Rh negative, antepartum 08/20/2019   History of pre-eclampsia 08/18/2019   Ovarian cyst 07/24/2019   History of completed stroke 07/12/2019   Smoker 07/12/2019   Moderate episode of recurrent major depressive disorder (HCC) 03/08/2019   Benzodiazepine dependence (HCC) 09/21/2018   Seizure disorder (HCC) 06/22/2017   Generalized anxiety disorder 07/23/2016   Memory loss 03/28/2013   Cerebral vasospasm 03/10/2012   Anemia 03/10/2012   Subarachnoid hemorrhage (HCC) 03/03/2012      Social History   reports that she has been smoking cigarettes. She has been exposed to tobacco smoke. She has never used smokeless tobacco. She reports that she does not currently use alcohol. She reports that she does not currently use drugs.   Family History  family history includes COPD in her mother; Diabetes in her paternal grandmother; Hypertension in her mother; Juvenile idiopathic arthritis in her daughter.   Medications: reviewed and updated   Objective:   Physical Exam BP 128/70   Pulse (!) 112   Temp 98 F (36.7 C) (Oral)   Resp 16   Ht 5\' 8"  (1.727 m)   Wt 212 lb 12.8 oz (96.5 kg)   LMP 05/07/2024 (Exact Date)   SpO2 97%   BMI 32.36 kg/m   Physical Exam HENT:  Head: Normocephalic and atraumatic.     Nose: Nose normal.     Mouth/Throat:     Mouth: Mucous membranes are moist.     Pharynx: Oropharynx is clear.  Eyes:     Extraocular Movements: Extraocular movements intact.     Conjunctiva/sclera: Conjunctivae normal.     Pupils: Pupils are equal, round, and  reactive to light.  Cardiovascular:     Rate and Rhythm: Tachycardia present.     Pulses: Normal pulses.     Heart sounds: Normal heart sounds.  Pulmonary:     Effort: Pulmonary effort is normal.     Breath sounds: Normal breath sounds.  Musculoskeletal:        General: Normal range of motion.     Cervical back: Normal range of motion and neck supple.  Neurological:     General: No focal deficit present.     Mental Status: She is alert and oriented to person, place, and time.  Psychiatric:        Mood and Affect: Mood normal.        Behavior: Behavior normal.        Assessment & Plan:  1. Encounter to establish care (Primary) - Patient presents today to establish care. During the interim follow-up with primary provider as scheduled.  - Return for annual physical examination, labs, and health maintenance. Arrive fasting meaning having no food for at least 8 hours prior to appointment. You may have only water or black coffee. Please take scheduled medications as normal.  2. History of stroke - Referral to Neurology for evaluation/management. - Ambulatory referral to Neurology  3. Anxiety and depression - Patient denies thoughts of self-harm, suicidal ideations, homicidal ideations. - I discussed with patient in detail I am unable to prescribed Alprazolam  due to [facility]'s office policy. Patient verbalized understanding/agreement.  - Patient declined pharmacological therapy. - Referral to Psychiatry for evaluation/management.  - Follow-up with primary provider as scheduled. - Ambulatory referral to Psychiatry  4. Immunization due - Administered. - Pneumococcal conjugate vaccine 20-valent (Prevnar 20)    Patient was given clear instructions to go to Emergency Department or return to medical center if symptoms don't improve, worsen, or new problems develop.The patient verbalized understanding.  I discussed the assessment and treatment plan with the patient. The patient was  provided an opportunity to ask questions and all were answered. The patient agreed with the plan and demonstrated an understanding of the instructions.   The patient was advised to call back or seek an in-person evaluation if the symptoms worsen or if the condition fails to improve as anticipated.    Lavona Pounds, NP 05/11/2024, 10:16 AM Primary Care at [facility]

## 2024-05-12 ENCOUNTER — Encounter: Payer: Self-pay | Admitting: Family

## 2024-05-15 ENCOUNTER — Other Ambulatory Visit: Payer: Self-pay | Admitting: Family

## 2024-05-15 DIAGNOSIS — F1111 Opioid abuse, in remission: Secondary | ICD-10-CM

## 2024-05-15 NOTE — Telephone Encounter (Signed)
 Primary Care will be unable to manage Buprenorphine . Referral to Psychiatry for evaluation/management. Expect call soon with appointment details. Please let me know if I can further assist patient. Thank you.
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
## 2024-05-19 ENCOUNTER — Encounter: Payer: Self-pay | Admitting: Physician Assistant

## 2024-05-19 ENCOUNTER — Encounter (HOSPITAL_COMMUNITY): Payer: Self-pay | Admitting: Psychiatry

## 2024-05-19 ENCOUNTER — Ambulatory Visit (INDEPENDENT_AMBULATORY_CARE_PROVIDER_SITE_OTHER): Payer: MEDICAID | Admitting: Psychiatry

## 2024-05-19 DIAGNOSIS — F410 Panic disorder [episodic paroxysmal anxiety] without agoraphobia: Secondary | ICD-10-CM | POA: Diagnosis not present

## 2024-05-19 DIAGNOSIS — F1320 Sedative, hypnotic or anxiolytic dependence, uncomplicated: Secondary | ICD-10-CM | POA: Diagnosis not present

## 2024-05-19 DIAGNOSIS — F411 Generalized anxiety disorder: Secondary | ICD-10-CM | POA: Diagnosis not present

## 2024-05-19 DIAGNOSIS — F1121 Opioid dependence, in remission: Secondary | ICD-10-CM

## 2024-05-19 DIAGNOSIS — F53 Postpartum depression: Secondary | ICD-10-CM

## 2024-05-19 NOTE — Patient Instructions (Signed)
 Thank you for attending your appointment today.  -- As discussed, we did not start any new medications today. -- I strongly recommend going to the emergency room or behavioral health urgent care center (information below) for detox from Xanax .  -- Continue other medications as prescribed.  Please do not make any changes to medications without first discussing with your provider. If you are experiencing a psychiatric emergency, please call 911 or present to your nearest emergency department. Additional crisis, medication management, and therapy resources are included below.  [facility]  [address] [place], [place] [address] [phone] WALK-IN URGENT CARE 24/7 FOR ANYONE [address], [place], [place]  [phone] Fax: [phone] [email or web address] *Interpreters available *Accepts all insurance and uninsured for Urgent Care needs *Accepts Medicaid and uninsured for outpatient treatment (below)      ONLY FOR [facility]  Below:    Outpatient New Patient Assessment/Therapy Walk-ins:        Monday, Wednesday, and Thursday 8am until slots are full (first come, first served)                   New Patient Psychiatry/Medication Management        Monday-Friday 8am-11am (first come, first served)               For all walk-ins we ask that you arrive by 7:15am, because patients will be seen in the order of arrival.

## 2024-05-25 NOTE — Telephone Encounter (Signed)
 Patient did receive a call from a psychiatry already

## 2024-05-25 NOTE — Telephone Encounter (Signed)
I called patient and left voicemail to return my call.

## 2024-06-13 ENCOUNTER — Encounter: Payer: Self-pay | Admitting: Family

## 2024-06-13 ENCOUNTER — Ambulatory Visit (INDEPENDENT_AMBULATORY_CARE_PROVIDER_SITE_OTHER): Payer: MEDICAID | Admitting: Family

## 2024-06-13 VITALS — BP 132/88 | HR 101 | Temp 98.2°F | Resp 18 | Ht 68.0 in | Wt 211.2 lb

## 2024-06-13 DIAGNOSIS — F32A Depression, unspecified: Secondary | ICD-10-CM

## 2024-06-13 DIAGNOSIS — Z13228 Encounter for screening for other metabolic disorders: Secondary | ICD-10-CM

## 2024-06-13 DIAGNOSIS — R221 Localized swelling, mass and lump, neck: Secondary | ICD-10-CM

## 2024-06-13 DIAGNOSIS — Z131 Encounter for screening for diabetes mellitus: Secondary | ICD-10-CM

## 2024-06-13 DIAGNOSIS — Z1329 Encounter for screening for other suspected endocrine disorder: Secondary | ICD-10-CM

## 2024-06-13 DIAGNOSIS — Z130 Encounter for screening for diseases of the blood and blood-forming organs and certain disorders involving the immune mechanism: Secondary | ICD-10-CM | POA: Diagnosis not present

## 2024-06-13 DIAGNOSIS — Z0000 Encounter for general adult medical examination without abnormal findings: Secondary | ICD-10-CM | POA: Diagnosis not present

## 2024-06-13 DIAGNOSIS — F419 Anxiety disorder, unspecified: Secondary | ICD-10-CM

## 2024-06-13 DIAGNOSIS — Z13220 Encounter for screening for lipoid disorders: Secondary | ICD-10-CM

## 2024-06-13 NOTE — Progress Notes (Signed)
 Patient still have anxiety, wants to talk about weight loss, patient scored a 15 on the GAD-7

## 2024-06-13 NOTE — Progress Notes (Signed)
 Patient ID: Dawn Davies, female    DOB: [birth year]  MRN: [id number]  CC: Annual Exam  Subjective: Dawn Davies is a 33 y.o. female who presents for annual exam.   Her concerns today include:  - Anxiety depression. States she was seen by [facility] and they treated her like she was an addict. She denies thoughts of self-harm, suicidal ideations, homicidal ideations. - States nodules of neck. Denies red flag symptoms.   Patient Active Problem List   Diagnosis Date Noted   Pre-eclampsia 03/08/2024   Obesity (BMI 30-39.9) 03/08/2024   Encounter for counseling regarding contraception 03/08/2024   History of gestational diabetes 08/13/2022   Encounter for monitoring Subutex  maintenance therapy 08/03/2022   GBS (group B Streptococcus carrier), +RV culture, currently pregnant 04/09/2022   History of hepatitis C 04/08/2022   Vacuum-assisted vaginal delivery 04/07/2022   Thyroid  condition 04/07/2022   GDM, class A2 04/07/2022   Echogenic focus of bowel of fetus affecting management of mother in singleton pregnancy, antepartum 02/06/2022   Asthmatic bronchitis, mild intermittent, with acute exacerbation 01/21/2022   Nicotine  dependence, cigarettes, uncomplicated 01/21/2022   Echogenic bowel of fetus on prenatal ultrasound 11/19/2021   Late prenatal care 10/22/2021   Seasonal allergic rhinitis 05/07/2021   Abdominal mass 03/19/2021   Abnormal liver function tests 03/19/2021   Acute pelvic pain 03/19/2021   Menorrhagia with irregular cycle 01/14/2021   Multiple thyroid  nodules 01/14/2021   Postpartum major depression in remission 04/03/2020   Thyrotoxicosis 02/06/2020   Pregnancy complicated by subutex  maintenance, antepartum (HCC) 02/06/2020   Pap smear of cervix with ASCUS, cannot exclude HGSIL 09/01/2019   Opioid use disorder, moderate, in sustained remission, on maintenance therapy (HCC) 08/24/2019   Rh negative, antepartum 08/20/2019   History of pre-eclampsia  08/18/2019   Ovarian cyst 07/24/2019   History of completed stroke 07/12/2019   Smoker 07/12/2019   Moderate episode of recurrent major depressive disorder (HCC) 03/08/2019   Severe benzodiazepine use disorder (HCC) 09/21/2018   Seizure disorder (HCC) 06/22/2017   Generalized anxiety disorder with panic attacks 07/23/2016   Memory loss 03/28/2013   Cerebral vasospasm 03/10/2012   Anemia 03/10/2012   Subarachnoid hemorrhage (HCC) 03/03/2012     Current Outpatient Medications on File Prior to Visit  Medication Sig Dispense Refill   fluticasone  (FLONASE ) 50 MCG/ACT nasal spray Place 2 sprays into both nostrils daily. 16 g 0   loratadine  (CLARITIN ) 10 MG tablet Take 1 tablet (10 mg total) by mouth daily. 90 tablet 3   Menthol , Topical Analgesic, (BIOFREEZE COOL THE PAIN LARGE) 5 % PTCH Use 1 patch every 12 hours as needed 14 patch 0   methylPREDNISolone  (MEDROL  DOSEPAK) 4 MG TBPK tablet 6 day taper; take as directed on package instructions 21 tablet 0   Multiple Vitamins-Minerals (WOMENS MULTIVITAMIN PO) Take 1 tablet by mouth daily in the afternoon.     Norethindrone  Acetate-Ethinyl Estradiol  (LOESTRIN) 1.5-30 MG-MCG tablet Take 1 tablet by mouth daily. 28 tablet 3   ondansetron  (ZOFRAN -ODT) 4 MG disintegrating tablet Take 1 tablet (4 mg total) by mouth every 8 (eight) hours as needed. 20 tablet 0   Prenatal Vit-Fe Fumarate-FA (M-NATAL PLUS) 27-1 MG TABS Take 1 tablet by mouth daily.     albuterol  (VENTOLIN  HFA) 108 (90 Base) MCG/ACT inhaler Inhale 1-2 puffs into the lungs every 6 (six) hours as needed. 8 g 0   brompheniramine-pseudoephedrine-DM 30-2-10 MG/5ML syrup Take 5 mLs by mouth 4 (four) times daily as needed. (Patient not taking:  Reported on 05/19/2024) 120 mL 0   buprenorphine  (SUBUTEX ) 8 MG SUBL SL tablet Place 24 mg under the tongue daily as needed for withdrawal. (Patient taking differently: Place 8 mg under the tongue in the morning, at noon, and at bedtime.)     cyclobenzaprine   (FLEXERIL ) 10 MG tablet Take 0.5-1 tablets (5-10 mg total) by mouth 3 (three) times daily as needed. (Patient not taking: Reported on 05/19/2024) 30 tablet 0   ferrous sulfate  325 (65 FE) MG EC tablet Take by mouth. (Patient not taking: Reported on 05/19/2024)     gabapentin  (NEURONTIN ) 600 MG tablet Take 600 mg by mouth 3 (three) times daily.  (Patient taking differently: Take 400 mg by mouth 3 (three) times daily.)     hydrOXYzine  (VISTARIL ) 25 MG capsule Take 1 capsule (25 mg total) by mouth every 8 (eight) hours as needed. 30 capsule 0   ibuprofen  (ADVIL ) 600 MG tablet Take 1 tablet (600 mg total) by mouth every 6 (six) hours as needed. (Patient not taking: Reported on 05/19/2024) 60 tablet 0   naproxen  (NAPROSYN ) 500 MG tablet Take 1 tablet (500 mg total) by mouth 2 (two) times daily with a meal. (Patient not taking: Reported on 05/19/2024) 30 tablet 0   No current facility-administered medications on file prior to visit.    Allergies  Allergen Reactions   Morphine Anaphylaxis and Rash   Hydrocodone-Acetaminophen  Itching and Other (See Comments)    Reaction not specified by patient Reaction not specified by pat    Naloxone Nausea And Vomiting   Varenicline Other (See Comments)    headaches   Xyzal [Levocetirizine] Anxiety    Panic attack    Social History   Socioeconomic History   Marital status: Legally Separated    Spouse name: Not on file   Number of children: 1   Years of education: Not on file   Highest education level: 10th grade  Occupational History   Not on file  Tobacco Use   Smoking status: Every Day    Current packs/day: 2.00    Types: Cigarettes    Passive exposure: Current   Smokeless tobacco: Never  Vaping Use   Vaping status: Never Used  Substance and Sexual Activity   Alcohol use: Not Currently   Drug use: Yes    Types: Benzodiazepines    Comment: Using illicit Xanax  1 mg TID   Sexual activity: Yes    Birth control/protection: None  Other Topics  Concern   Not on file  Social History Narrative   Pt is left handed   Lives in 2 story home with her daughter, brother and both parents   10th grade education   Has 1 child   On disability      Pt lives with fiance in [place] and two other children live with other family members.   Social Drivers of Health   Financial Resource Strain: Medium Risk (06/12/2024)   Overall Financial Resource Strain (CARDIA)    Difficulty of Paying Living Expenses: Somewhat hard  Food Insecurity: Food Insecurity Present (06/12/2024)   Hunger Vital Sign    Worried About Running Out of Food in the Last Year: Sometimes true    Ran Out of Food in the Last Year: Sometimes true  Transportation Needs: No Transportation Needs (06/12/2024)   PRAPARE - Administrator, Civil Service (Medical): No    Lack of Transportation (Non-Medical): No  Physical Activity: Insufficiently Active (06/12/2024)   Exercise Vital Sign  Days of Exercise per Week: 2 days    Minutes of Exercise per Session: 20 min  Stress: Stress Concern Present (06/12/2024)   [employer] of Occupational Health - Occupational Stress Questionnaire    Feeling of Stress: Very much  Social Connections: Moderately Isolated (06/12/2024)   Social Connection and Isolation Panel    Frequency of Communication with Friends and Family: Twice a week    Frequency of Social Gatherings with Friends and Family: Once a week    Attends Religious Services: Never    Database administrator or Organizations: No    Attends Engineer, structural: Not on file    Marital Status: Living with partner  Intimate Partner Violence: Not At Risk (12/10/2022)   Received from [facility]   HITS    Over the last 12 months how often did your partner physically hurt you?: Never    Over the last 12 months how often did your partner insult you or talk down to you?: Never    Over the last 12 months how often did your partner threaten you with physical harm?: Never     Over the last 12 months how often did your partner scream or curse at you?: Never    Family History  Problem Relation Age of Onset   Hypertension Mother    COPD Mother    Anxiety disorder Mother    Alcohol abuse Father    Diabetes Paternal Grandmother    Juvenile idiopathic arthritis Daughter    ADD / ADHD Daughter     Past Surgical History:  Procedure Laterality Date   MULTIPLE TOOTH EXTRACTIONS      ROS: Review of Systems Negative except as stated above  PHYSICAL EXAM: BP 132/88   Pulse (!) 101   Temp 98.2 F (36.8 C) (Oral)   Resp 18   Ht 5' 8 (1.727 m)   Wt 211 lb 3.2 oz (95.8 kg)   LMP 05/07/2024 (Exact Date)   SpO2 96%   BMI 32.11 kg/m   Physical Exam HENT:     Head: Normocephalic and atraumatic.     Right Ear: Tympanic membrane, ear canal and external ear normal.     Left Ear: Tympanic membrane, ear canal and external ear normal.     Nose: Nose normal.     Mouth/Throat:     Mouth: Mucous membranes are moist.     Pharynx: Oropharynx is clear.  Eyes:     Extraocular Movements: Extraocular movements intact.     Conjunctiva/sclera: Conjunctivae normal.     Pupils: Pupils are equal, round, and reactive to light.  Neck:     Thyroid : No thyroid  mass, thyromegaly or thyroid  tenderness.  Cardiovascular:     Rate and Rhythm: Tachycardia present.     Pulses: Normal pulses.     Heart sounds: Normal heart sounds.  Pulmonary:     Effort: Pulmonary effort is normal.     Breath sounds: Normal breath sounds.  Chest:     Comments: Patient declined. Abdominal:     General: Bowel sounds are normal.     Palpations: Abdomen is soft.  Genitourinary:    Comments: Patient declined. Musculoskeletal:        General: Normal range of motion.     Right shoulder: Normal.     Left shoulder: Normal.     Right upper arm: Normal.     Left upper arm: Normal.     Right elbow: Normal.     Left elbow:  Normal.     Right forearm: Normal.     Left forearm: Normal.     Right  wrist: Normal.     Left wrist: Normal.     Right hand: Normal.     Left hand: Normal.     Cervical back: Normal, normal range of motion and neck supple.     Thoracic back: Normal.     Lumbar back: Normal.     Right hip: Normal.     Left hip: Normal.     Right upper leg: Normal.     Left upper leg: Normal.     Right knee: Normal.     Left knee: Normal.     Right lower leg: Normal.     Left lower leg: Normal.     Right ankle: Normal.     Left ankle: Normal.     Right foot: Normal.     Left foot: Normal.  Skin:    General: Skin is warm and dry.     Capillary Refill: Capillary refill takes less than 2 seconds.  Neurological:     General: No focal deficit present.     Mental Status: She is alert and oriented to person, place, and time.  Psychiatric:        Mood and Affect: Mood normal.        Behavior: Behavior normal.    ASSESSMENT AND PLAN: 1. Annual physical exam (Primary) - Counseled on 150 minutes of exercise per week as tolerated, healthy eating (including decreased daily intake of saturated fats, cholesterol, added sugars, sodium), STI prevention, and routine healthcare maintenance.  2. Screening for metabolic disorder - Routine screening.  - CMP14+EGFR  3. Screening for deficiency anemia - Routine screening.  - CBC  4. Diabetes mellitus screening - Routine screening.  - Hemoglobin A1c  5. Screening cholesterol level - Routine screening.  - Lipid panel  6. Thyroid  disorder screen - Routine screening.  - TSH  7. Anxiety and depression - Patient denies thoughts of self-harm, suicidal ideations, homicidal ideations. - Please note I discussed with supervising physician Raguel Blush, MD in detail patient's request for Buprenorphine  and Alprazolam  and was advised not prescribe and defer to Psychiatry. I discussed with patient in detail advisement from supervising physician. Patient verbalized understanding/agreement. - Referral to Psychiatry for  evaluation/management. - Ambulatory referral to Psychiatry  8. Neck nodule - Routine screening.  - US  THYROID ; Future   Patient was given the opportunity to ask questions.  Patient verbalized understanding of the plan and was able to repeat key elements of the plan. Patient was given clear instructions to go to Emergency Department or return to medical center if symptoms don't improve, worsen, or new problems develop.The patient verbalized understanding.   Orders Placed This Encounter  Procedures   US  THYROID    CBC   Lipid panel   CMP14+EGFR   Hemoglobin A1c   TSH   Ambulatory referral to Psychiatry    Return in about 1 year (around 06/13/2025) for Physical per patient preference.  Greig JINNY Drones, NP

## 2024-06-14 ENCOUNTER — Ambulatory Visit: Payer: Self-pay | Admitting: Family

## 2024-06-14 ENCOUNTER — Telehealth: Payer: Self-pay | Admitting: Family

## 2024-06-14 ENCOUNTER — Other Ambulatory Visit: Payer: Self-pay | Admitting: Family

## 2024-06-14 DIAGNOSIS — E0590 Thyrotoxicosis, unspecified without thyrotoxic crisis or storm: Secondary | ICD-10-CM

## 2024-06-14 DIAGNOSIS — Z6832 Body mass index (BMI) 32.0-32.9, adult: Secondary | ICD-10-CM

## 2024-06-14 DIAGNOSIS — Z130 Encounter for screening for diseases of the blood and blood-forming organs and certain disorders involving the immune mechanism: Secondary | ICD-10-CM

## 2024-06-14 DIAGNOSIS — Z7689 Persons encountering health services in other specified circumstances: Secondary | ICD-10-CM

## 2024-06-14 LAB — CBC
Hematocrit: 42.9 % (ref 34.0–46.6)
Hemoglobin: 13.4 g/dL (ref 11.1–15.9)
MCH: 29.6 pg (ref 26.6–33.0)
MCHC: 31.2 g/dL — ABNORMAL LOW (ref 31.5–35.7)
MCV: 95 fL (ref 79–97)
Platelets: 337 x10E3/uL (ref 150–450)
RBC: 4.53 x10E6/uL (ref 3.77–5.28)
RDW: 13.4 % (ref 11.7–15.4)
WBC: 13.7 x10E3/uL — ABNORMAL HIGH (ref 3.4–10.8)

## 2024-06-14 LAB — CMP14+EGFR
ALT: 12 IU/L (ref 0–32)
AST: 15 IU/L (ref 0–40)
Albumin: 4.4 g/dL (ref 3.9–4.9)
Alkaline Phosphatase: 92 IU/L (ref 44–121)
BUN/Creatinine Ratio: 13 (ref 9–23)
BUN: 9 mg/dL (ref 6–20)
Bilirubin Total: <0.2 mg/dL (ref 0.0–1.2)
CO2: 16 mmol/L — ABNORMAL LOW (ref 20–29)
Calcium: 8.9 mg/dL (ref 8.7–10.2)
Chloride: 106 mmol/L (ref 96–106)
Creatinine, Ser: 0.72 mg/dL (ref 0.57–1.00)
Globulin, Total: 2.5 g/dL (ref 1.5–4.5)
Glucose: 124 mg/dL — ABNORMAL HIGH (ref 70–99)
Potassium: 4.5 mmol/L (ref 3.5–5.2)
Sodium: 139 mmol/L (ref 134–144)
Total Protein: 6.9 g/dL (ref 6.0–8.5)
eGFR: 113 mL/min/1.73 (ref >59)

## 2024-06-14 LAB — LIPID PANEL
Chol/HDL Ratio: 2.3 ratio (ref 0.0–4.4)
Cholesterol, Total: 157 mg/dL (ref 100–199)
HDL: 69 mg/dL (ref >39)
LDL Chol Calc (NIH): 72 mg/dL (ref 0–99)
Triglycerides: 89 mg/dL (ref 0–149)
VLDL Cholesterol Cal: 16 mg/dL (ref 5–40)

## 2024-06-14 LAB — HEMOGLOBIN A1C
Est. average glucose Bld gHb Est-mCnc: 100 mg/dL
Hgb A1c MFr Bld: 5.1 % (ref 4.8–5.6)

## 2024-06-14 LAB — TSH: TSH: 0.161 u[IU]/mL — ABNORMAL LOW (ref 0.450–4.500)

## 2024-06-14 MED ORDER — PHENTERMINE HCL 15 MG PO CAPS: 15.0000 mg | ORAL_CAPSULE | ORAL | 0 refills | Status: DC

## 2024-06-14 NOTE — Telephone Encounter (Signed)
-   Phentermine  prescribed 06/14/2024. -  During office visit on 06/13/2024 I discussed with patient in detail recommendation from supervising physician Raguel Blush, MD in regards to prescriptions for Buprenorphine  and Alprazolam .

## 2024-06-14 NOTE — Telephone Encounter (Signed)
I called patient back and spoke with her.

## 2024-06-14 NOTE — Telephone Encounter (Signed)
 Copied from CRM [id number]. Topic: General - Other >> Jun 14, 2024 11:59 AM Zebedee SAUNDERS wrote: Reason for CRM: Pt returning message from lab results and pt would like a call [phone] back regarding questions, What does that mean if my white blood cells are higher? Pt has been scheduled for follow up labs for 07/11/2024.

## 2024-06-14 NOTE — Telephone Encounter (Signed)
 Noted! Thank you

## 2024-06-14 NOTE — Telephone Encounter (Signed)
 Patient called and is aware of provider advise

## 2024-06-15 ENCOUNTER — Inpatient Hospital Stay: Admission: RE | Admit: 2024-06-15 | Payer: MEDICAID | Source: Ambulatory Visit

## 2024-06-15 ENCOUNTER — Telehealth: Payer: Self-pay | Admitting: Emergency Medicine

## 2024-06-15 NOTE — Telephone Encounter (Signed)
 I called and made her aware that the psychiatry referral will call her with an appointment within two weeks

## 2024-06-16 ENCOUNTER — Other Ambulatory Visit: Payer: Self-pay | Admitting: Family

## 2024-06-16 DIAGNOSIS — Z7689 Persons encountering health services in other specified circumstances: Secondary | ICD-10-CM

## 2024-06-16 DIAGNOSIS — Z6832 Body mass index (BMI) 32.0-32.9, adult: Secondary | ICD-10-CM

## 2024-06-16 MED ORDER — PHENTERMINE HCL 15 MG PO CAPS
15.0000 mg | ORAL_CAPSULE | ORAL | 0 refills | Status: AC
Start: 2024-06-16 — End: ?

## 2024-06-16 NOTE — Telephone Encounter (Signed)
 FYI Only or Action Required?: Action required by provider: medication refill request and clinical question for provider.  Patient was last seen in primary care on 06/13/2024 by Lorren Greig PARAS, NP.  Called Nurse Triage reporting Medication Refill.  [facility] Rep reported that Rx was ready to be picked up. [facility] notified Rep that pt requesting [facility]. Rep will put Rx back.   [facility] will forward refill for PCP to refill at pharmacy requested ([facility]). Rx requested:  phentermine  15 [facility] on [facility].  Additionally, pt has questions re: Rx and ultrasound. Please advise.   Copied from CRM [id number]. Topic: Clinical - Medication Question >> Jun 16, 2024  2:37 PM Delon HERO wrote: Reason for CRM: Patient is calling to change the pharmacy on phentermine  15 MG capsule [[id number]]w which she would like the medication to be sent to. Patient is also reporting that she has an ultrasound scheduled for Monday. Is it ok to the phentermine  15 MG capsule [[id number]] before?  Please advise >> Jun 16, 2024  3:40 PM Dominique A wrote: Patient would like for medication to be sent to: [id number] [place], [place] - [address] Phone: [phone]  Fax: [phone]

## 2024-06-16 NOTE — Telephone Encounter (Signed)
 Pharmacy change

## 2024-06-16 NOTE — Telephone Encounter (Signed)
 Copied from CRM [id number]. Topic: Clinical - Medication Refill >> Jun 16, 2024  2:34 PM Delon HERO wrote: Medication: phentermine  15 MG capsule [[id number]] Patient is calling to change the pharmacy   Has the patient contacted their pharmacy? Yes (Agent: If no, request that the patient contact the pharmacy for the refill. If patient does not wish to contact the pharmacy document the reason why and proceed with request.) (Agent: If yes, when and what did the pharmacy advise?)  This is the patient's preferred pharmacy:    [id number] [place], [place] - [address] [place] [address] Phone: [phone] Fax: [phone]  Is this the correct pharmacy for this prescription? Yes If no, delete pharmacy and type the correct one.   Has the prescription been filled recently? Yes  Is the patient out of the medication? Yes  Has the patient been seen for an appointment in the last year OR does the patient have an upcoming appointment? Yes  Can we respond through MyChart? Yes  Agent: Please be advised that Rx refills may take up to 3 business days. We ask that you follow-up with your pharmacy.

## 2024-06-16 NOTE — Telephone Encounter (Signed)
 Complete

## 2024-06-19 ENCOUNTER — Telehealth: Payer: Self-pay | Admitting: Emergency Medicine

## 2024-06-19 ENCOUNTER — Other Ambulatory Visit: Payer: Self-pay | Admitting: Family

## 2024-06-19 ENCOUNTER — Other Ambulatory Visit: Payer: MEDICAID

## 2024-06-19 DIAGNOSIS — E041 Nontoxic single thyroid nodule: Secondary | ICD-10-CM

## 2024-06-19 NOTE — Telephone Encounter (Signed)
 Phentermine  prescribed 06/16/2024 4:54 PM.

## 2024-06-19 NOTE — Telephone Encounter (Signed)
 I spoke to PCP and they stated that the order could be cancel patient already had one on /03/30/2024

## 2024-07-07 ENCOUNTER — Telehealth: Payer: MEDICAID | Admitting: Physician Assistant

## 2024-07-07 DIAGNOSIS — M549 Dorsalgia, unspecified: Secondary | ICD-10-CM

## 2024-07-07 DIAGNOSIS — M546 Pain in thoracic spine: Secondary | ICD-10-CM | POA: Diagnosis not present

## 2024-07-07 MED ORDER — METHOCARBAMOL 500 MG PO TABS: 500.0000 mg | ORAL_TABLET | Freq: Four times a day (QID) | ORAL | 0 refills | Status: AC

## 2024-07-07 MED ORDER — NAPROXEN 500 MG PO TABS: 500.0000 mg | ORAL_TABLET | Freq: Two times a day (BID) | ORAL | 0 refills | Status: DC

## 2024-07-07 NOTE — Patient Instructions (Signed)
 Dawn Davies, thank you for joining Teena Shuck, PA-C for today's virtual visit.  While this provider is not your primary care provider (PCP), if your PCP is located in our provider database this encounter information will be shared with them immediately following your visit.   A [facility] MyChart account gives you access to today's visit and all your visits, tests, and labs performed at [facility]  click here if you don't have a [facility] MyChart account or go to [email or web address]  Consent: (Patient) Paighton Godette provided verbal consent for this virtual visit at the beginning of the encounter.  Current Medications:  Current Outpatient Medications:    albuterol  (VENTOLIN  HFA) 108 (90 Base) MCG/ACT inhaler, Inhale 1-2 puffs into the lungs every 6 (six) hours as needed., Disp: 8 g, Rfl: 0   brompheniramine-pseudoephedrine-DM 30-2-10 MG/5ML syrup, Take 5 mLs by mouth 4 (four) times daily as needed. (Patient not taking: Reported on 05/19/2024), Disp: 120 mL, Rfl: 0   buprenorphine  (SUBUTEX ) 8 MG SUBL SL tablet, Place 24 mg under the tongue daily as needed for withdrawal. (Patient taking differently: Place 8 mg under the tongue in the morning, at noon, and at bedtime.), Disp: , Rfl:    cyclobenzaprine  (FLEXERIL ) 10 MG tablet, Take 0.5-1 tablets (5-10 mg total) by mouth 3 (three) times daily as needed. (Patient not taking: Reported on 05/19/2024), Disp: 30 tablet, Rfl: 0   ferrous sulfate  325 (65 FE) MG EC tablet, Take by mouth. (Patient not taking: Reported on 05/19/2024), Disp: , Rfl:    fluticasone  (FLONASE ) 50 MCG/ACT nasal spray, Place 2 sprays into both nostrils daily., Disp: 16 g, Rfl: 0   gabapentin  (NEURONTIN ) 600 MG tablet, Take 600 mg by mouth 3 (three) times daily.  (Patient taking differently: Take 400 mg by mouth 3 (three) times daily.), Disp: , Rfl:    hydrOXYzine  (VISTARIL ) 25 MG capsule, Take 1 capsule (25 mg total) by mouth every 8 (eight) hours as needed.,  Disp: 30 capsule, Rfl: 0   ibuprofen  (ADVIL ) 600 MG tablet, Take 1 tablet (600 mg total) by mouth every 6 (six) hours as needed. (Patient not taking: Reported on 05/19/2024), Disp: 60 tablet, Rfl: 0   loratadine  (CLARITIN ) 10 MG tablet, Take 1 tablet (10 mg total) by mouth daily., Disp: 90 tablet, Rfl: 3   Menthol , Topical Analgesic, (BIOFREEZE COOL THE PAIN LARGE) 5 % PTCH, Use 1 patch every 12 hours as needed, Disp: 14 patch, Rfl: 0   methylPREDNISolone  (MEDROL  DOSEPAK) 4 MG TBPK tablet, 6 day taper; take as directed on package instructions, Disp: 21 tablet, Rfl: 0   Multiple Vitamins-Minerals (WOMENS MULTIVITAMIN PO), Take 1 tablet by mouth daily in the afternoon., Disp: , Rfl:    naproxen  (NAPROSYN ) 500 MG tablet, Take 1 tablet (500 mg total) by mouth 2 (two) times daily with a meal., Disp: 30 tablet, Rfl: 0   Norethindrone  Acetate-Ethinyl Estradiol  (LOESTRIN) 1.5-30 MG-MCG tablet, Take 1 tablet by mouth daily., Disp: 28 tablet, Rfl: 3   ondansetron  (ZOFRAN -ODT) 4 MG disintegrating tablet, Take 1 tablet (4 mg total) by mouth every 8 (eight) hours as needed., Disp: 20 tablet, Rfl: 0   phentermine  15 MG capsule, Take 1 capsule (15 mg total) by mouth every morning., Disp: 30 capsule, Rfl: 0   Prenatal Vit-Fe Fumarate-FA (M-NATAL PLUS) 27-1 MG TABS, Take 1 tablet by mouth daily., Disp: , Rfl:    Medications ordered in this encounter:  Meds ordered this encounter  Medications   naproxen  (NAPROSYN ) 500 MG tablet  Sig: Take 1 tablet (500 mg total) by mouth 2 (two) times daily with a meal.    Dispense:  30 tablet    Refill:  0    Supervising Provider:   BLAISE ALEENE KIDD [[id number]]     *If you need refills on other medications prior to your next appointment, please contact your pharmacy*  Follow-Up: Call back or seek an in-person evaluation if the symptoms worsen or if the condition fails to improve as anticipated.  [facility] [phone]  Other Instructions Please report  to the nearest Emergency room with any worsening symptoms. Follow up with primary care provider (PCP) in 2 -3 days.    If you have been instructed to have an in-person evaluation today at a local Urgent Care facility, please use the link below. It will take you to a list of all of our available [facility], including address, phone number and hours of operation. Please do not delay care.  [facility]  If you or a family member do not have a primary care provider, use the link below to schedule a visit and establish care. When you choose a [facility] primary care physician or advanced practice provider, you gain a long-term partner in health. Find a Primary Care Provider  Learn more about [facility]'s in-office and virtual care options: [facility] - Get Care Now

## 2024-07-07 NOTE — Progress Notes (Signed)
 Virtual Visit Consent   Dawn Davies, you are scheduled for a virtual visit with a [facility] provider today. Just as with appointments in the office, your consent must be obtained to participate. Your consent will be active for this visit and any virtual visit you may have with one of our providers in the next 365 days. If you have a MyChart account, a copy of this consent can be sent to you electronically.  As this is a virtual visit, video technology does not allow for your provider to perform a traditional examination. This may limit your provider's ability to fully assess your condition. If your provider identifies any concerns that need to be evaluated in person or the need to arrange testing (such as labs, EKG, etc.), we will make arrangements to do so. Although advances in technology are sophisticated, we cannot ensure that it will always work on either your end or our end. If the connection with a video visit is poor, the visit may have to be switched to a telephone visit. With either a video or telephone visit, we are not always able to ensure that we have a secure connection.  By engaging in this virtual visit, you consent to the provision of healthcare and authorize for your insurance to be billed (if applicable) for the services provided during this visit. Depending on your insurance coverage, you may receive a charge related to this service.  I need to obtain your verbal consent now. Are you willing to proceed with your visit today? Dawn Davies has provided verbal consent on 07/07/2024 for a virtual visit (video or telephone). Teena Shuck, [place]  Date: 07/07/2024 3:40 PM   Virtual Visit via Video Note   I, Teena Shuck, connected with  Dawn Davies  ([id number], 08-14-1991) on 07/07/24 at  3:30 PM EDT by a video-enabled telemedicine application and verified that I am speaking with the correct person using two identifiers.  Location: Patient: Virtual Visit Location Patient:  Home Provider: Virtual Visit Location Provider: Home Office   I discussed the limitations of evaluation and management by telemedicine and the availability of in person appointments. The patient expressed understanding and agreed to proceed.    History of Present Illness: Dawn Davies is a 33 y.o. who identifies as a female who was assigned female at birth, and is being seen today for back pain.  HPI: Back Pain This is a new problem. The current episode started today. The problem has been waxing and waning since onset. The pain is present in the lumbar spine. The pain is moderate. The symptoms are aggravated by bending. Pertinent negatives include no bladder incontinence, bowel incontinence, dysuria, leg pain, numbness, paresis, paresthesias, pelvic pain or perianal numbness. She has tried nothing for the symptoms. The treatment provided mild relief.    Problems:  Patient Active Problem List   Diagnosis Date Noted   Pre-eclampsia 03/08/2024   Obesity (BMI 30-39.9) 03/08/2024   Encounter for counseling regarding contraception 03/08/2024   History of gestational diabetes 08/13/2022   Encounter for monitoring Subutex  maintenance therapy 08/03/2022   GBS (group B Streptococcus carrier), +RV culture, currently pregnant 04/09/2022   History of hepatitis C 04/08/2022   Vacuum-assisted vaginal delivery 04/07/2022   Thyroid  condition 04/07/2022   GDM, class A2 04/07/2022   Echogenic focus of bowel of fetus affecting management of mother in singleton pregnancy, antepartum 02/06/2022   Asthmatic bronchitis, mild intermittent, with acute exacerbation 01/21/2022   Nicotine  dependence, cigarettes, uncomplicated 01/21/2022   Echogenic bowel of  fetus on prenatal ultrasound 11/19/2021   Late prenatal care 10/22/2021   Seasonal allergic rhinitis 05/07/2021   Abdominal mass 03/19/2021   Abnormal liver function tests 03/19/2021   Acute pelvic pain 03/19/2021   Menorrhagia with irregular cycle  01/14/2021   Multiple thyroid  nodules 01/14/2021   Postpartum major depression in remission 04/03/2020   Thyrotoxicosis 02/06/2020   Pregnancy complicated by subutex  maintenance, antepartum (HCC) 02/06/2020   Pap smear of cervix with ASCUS, cannot exclude HGSIL 09/01/2019   Opioid use disorder, moderate, in sustained remission, on maintenance therapy (HCC) 08/24/2019   Rh negative, antepartum 08/20/2019   History of pre-eclampsia 08/18/2019   Ovarian cyst 07/24/2019   History of completed stroke 07/12/2019   Smoker 07/12/2019   Moderate episode of recurrent major depressive disorder (HCC) 03/08/2019   Severe benzodiazepine use disorder (HCC) 09/21/2018   Seizure disorder (HCC) 06/22/2017   Generalized anxiety disorder with panic attacks 07/23/2016   Memory loss 03/28/2013   Cerebral vasospasm 03/10/2012   Anemia 03/10/2012   Subarachnoid hemorrhage (HCC) 03/03/2012    Allergies:  Allergies  Allergen Reactions   Morphine Anaphylaxis and Rash   Hydrocodone-Acetaminophen  Itching and Other (See Comments)    Reaction not specified by patient Reaction not specified by pat    Naloxone Nausea And Vomiting   Varenicline Other (See Comments)    headaches   Xyzal [Levocetirizine] Anxiety    Panic attack   Medications:  Current Outpatient Medications:    albuterol  (VENTOLIN  HFA) 108 (90 Base) MCG/ACT inhaler, Inhale 1-2 puffs into the lungs every 6 (six) hours as needed., Disp: 8 g, Rfl: 0   brompheniramine-pseudoephedrine-DM 30-2-10 MG/5ML syrup, Take 5 mLs by mouth 4 (four) times daily as needed. (Patient not taking: Reported on 05/19/2024), Disp: 120 mL, Rfl: 0   buprenorphine  (SUBUTEX ) 8 MG SUBL SL tablet, Place 24 mg under the tongue daily as needed for withdrawal. (Patient taking differently: Place 8 mg under the tongue in the morning, at noon, and at bedtime.), Disp: , Rfl:    cyclobenzaprine  (FLEXERIL ) 10 MG tablet, Take 0.5-1 tablets (5-10 mg total) by mouth 3 (three) times daily  as needed. (Patient not taking: Reported on 05/19/2024), Disp: 30 tablet, Rfl: 0   ferrous sulfate  325 (65 FE) MG EC tablet, Take by mouth. (Patient not taking: Reported on 05/19/2024), Disp: , Rfl:    fluticasone  (FLONASE ) 50 MCG/ACT nasal spray, Place 2 sprays into both nostrils daily., Disp: 16 g, Rfl: 0   gabapentin  (NEURONTIN ) 600 MG tablet, Take 600 mg by mouth 3 (three) times daily.  (Patient taking differently: Take 400 mg by mouth 3 (three) times daily.), Disp: , Rfl:    hydrOXYzine  (VISTARIL ) 25 MG capsule, Take 1 capsule (25 mg total) by mouth every 8 (eight) hours as needed., Disp: 30 capsule, Rfl: 0   ibuprofen  (ADVIL ) 600 MG tablet, Take 1 tablet (600 mg total) by mouth every 6 (six) hours as needed. (Patient not taking: Reported on 05/19/2024), Disp: 60 tablet, Rfl: 0   loratadine  (CLARITIN ) 10 MG tablet, Take 1 tablet (10 mg total) by mouth daily., Disp: 90 tablet, Rfl: 3   Menthol , Topical Analgesic, (BIOFREEZE COOL THE PAIN LARGE) 5 % PTCH, Use 1 patch every 12 hours as needed, Disp: 14 patch, Rfl: 0   methylPREDNISolone  (MEDROL  DOSEPAK) 4 MG TBPK tablet, 6 day taper; take as directed on package instructions, Disp: 21 tablet, Rfl: 0   Multiple Vitamins-Minerals (WOMENS MULTIVITAMIN PO), Take 1 tablet by mouth daily in the afternoon., Disp: ,  Rfl:    naproxen  (NAPROSYN ) 500 MG tablet, Take 1 tablet (500 mg total) by mouth 2 (two) times daily with a meal., Disp: 30 tablet, Rfl: 0   Norethindrone  Acetate-Ethinyl Estradiol  (LOESTRIN) 1.5-30 MG-MCG tablet, Take 1 tablet by mouth daily., Disp: 28 tablet, Rfl: 3   ondansetron  (ZOFRAN -ODT) 4 MG disintegrating tablet, Take 1 tablet (4 mg total) by mouth every 8 (eight) hours as needed., Disp: 20 tablet, Rfl: 0   phentermine  15 MG capsule, Take 1 capsule (15 mg total) by mouth every morning., Disp: 30 capsule, Rfl: 0   Prenatal Vit-Fe Fumarate-FA (M-NATAL PLUS) 27-1 MG TABS, Take 1 tablet by mouth daily., Disp: , Rfl:    Observations/Objective: Patient is well-developed, well-nourished in no acute distress.  Resting comfortably  at home.  Head is normocephalic, atraumatic.  No labored breathing.  Speech is clear and coherent with logical content.  Patient is alert and oriented at baseline.    Assessment and Plan: 1. Back pain, unspecified back location, unspecified back pain laterality, unspecified chronicity (Primary)  2. Acute right-sided thoracic back pain - naproxen  (NAPROSYN ) 500 MG tablet; Take 1 tablet (500 mg total) by mouth 2 (two) times daily with a meal.  Dispense: 30 tablet; Refill: 0  Patient presenting with back pain. Denies any loss of bowel or bladder at this time. States this happened after assisting her son who has special needs. Medications prescribed.  Patient was advised to follow up with primary provider 3-5 days if continuing to have  pain. Advised to present to ER if signs of cauda equina or spinal infection develop including loss of bowel or bladder function, peripheral numbness/weakness/tingling, significant fevers, or other concerns.    Follow Up Instructions: I discussed the assessment and treatment plan with the patient. The patient was provided an opportunity to ask questions and all were answered. The patient agreed with the plan and demonstrated an understanding of the instructions.  A copy of instructions were sent to the patient via MyChart unless otherwise noted below.    The patient was advised to call back or seek an in-person evaluation if the symptoms worsen or if the condition fails to improve as anticipated.    Teena Shuck, PA-C

## 2024-07-10 ENCOUNTER — Ambulatory Visit (HOSPITAL_BASED_OUTPATIENT_CLINIC_OR_DEPARTMENT_OTHER): Admitting: Family Medicine

## 2024-07-11 ENCOUNTER — Encounter (INDEPENDENT_AMBULATORY_CARE_PROVIDER_SITE_OTHER): Payer: Self-pay

## 2024-07-11 ENCOUNTER — Other Ambulatory Visit (INDEPENDENT_AMBULATORY_CARE_PROVIDER_SITE_OTHER): Payer: MEDICAID

## 2024-07-17 ENCOUNTER — Other Ambulatory Visit: Payer: MEDICAID

## 2024-07-17 DIAGNOSIS — Z130 Encounter for screening for diseases of the blood and blood-forming organs and certain disorders involving the immune mechanism: Secondary | ICD-10-CM

## 2024-07-20 ENCOUNTER — Telehealth: Payer: MEDICAID | Admitting: Emergency Medicine

## 2024-07-20 DIAGNOSIS — M546 Pain in thoracic spine: Secondary | ICD-10-CM

## 2024-07-20 DIAGNOSIS — F419 Anxiety disorder, unspecified: Secondary | ICD-10-CM

## 2024-07-20 LAB — CBC
Hematocrit: 41.2 % (ref 34.0–46.6)
Hemoglobin: 12.7 g/dL (ref 11.1–15.9)
MCH: 28.6 pg (ref 26.6–33.0)
MCHC: 30.8 g/dL — ABNORMAL LOW (ref 31.5–35.7)
MCV: 93 fL (ref 79–97)
Platelets: 324 x10E3/uL (ref 150–450)
RBC: 4.44 x10E6/uL (ref 3.77–5.28)
RDW: 13.8 % (ref 11.7–15.4)
WBC: 10.3 x10E3/uL (ref 3.4–10.8)

## 2024-07-20 LAB — SPECIMEN STATUS REPORT

## 2024-07-20 MED ORDER — IBUPROFEN 600 MG PO TABS: 600.0000 mg | ORAL_TABLET | Freq: Three times a day (TID) | ORAL | 0 refills | Status: DC | PRN

## 2024-07-20 MED ORDER — CYCLOBENZAPRINE HCL 5 MG PO TABS: 5.0000 mg | ORAL_TABLET | Freq: Three times a day (TID) | ORAL | 0 refills | Status: DC | PRN

## 2024-07-20 NOTE — Progress Notes (Signed)
 Virtual Visit Consent   Dawn Davies, you are scheduled for a virtual visit with a [facility] provider today. Just as with appointments in the office, your consent must be obtained to participate. Your consent will be active for this visit and any virtual visit you may have with one of our providers in the next 365 days. If you have a MyChart account, a copy of this consent can be sent to you electronically.  As this is a virtual visit, video technology does not allow for your provider to perform a traditional examination. This may limit your provider's ability to fully assess your condition. If your provider identifies any concerns that need to be evaluated in person or the need to arrange testing (such as labs, EKG, etc.), we will make arrangements to do so. Although advances in technology are sophisticated, we cannot ensure that it will always work on either your end or our end. If the connection with a video visit is poor, the visit may have to be switched to a telephone visit. With either a video or telephone visit, we are not always able to ensure that we have a secure connection.  By engaging in this virtual visit, you consent to the provision of healthcare and authorize for your insurance to be billed (if applicable) for the services provided during this visit. Depending on your insurance coverage, you may receive a charge related to this service.  I need to obtain your verbal consent now. Are you willing to proceed with your visit today? Dawn Davies has provided verbal consent on 07/20/2024 for a virtual visit (video or telephone). Dawn CHRISTELLA Belt, NP  Date: 07/20/2024 7:08 PM   Virtual Visit via Video Note   I, Dawn Davies, connected with  Dawn Davies  ([id number], 1991/05/29) on 07/20/24 at  6:30 PM EDT by a video-enabled telemedicine application and verified that I am speaking with the correct person using two identifiers.  Location: Patient: Virtual Visit Location Patient:  Home Provider: Virtual Visit Location Provider: Home Office   I discussed the limitations of evaluation and management by telemedicine and the availability of in person appointments. The patient expressed understanding and agreed to proceed.    History of Present Illness: Dawn Davies is a 33 y.o. who identifies as a female who was assigned female at birth, and is being seen today for back pain for 2 weeks. Carries son who is large 33lbs -  he has autism - and this injured her upper back acouple  of weeks ago. She did a video visit for hte same problem 2 weeks ago, given naproxen  and Robaxin . Thinks robaxin  didn't help, naproxen  helped some. Has used flexeril  before and it worked better, she requests it again. Says her upper back muscles arereally tight. Currently using Biofreeze, heating pad. Thinks this is the 4th such back muscle injury she has had. Denies weakness, numbness, tingling.   Also c/o of Stroke - subarachnoid hemorrhage after birth of her child in 2013 and has had panic attacks since. Having 2-3 a day. Has gotten a mental health provider's number from a friend and plans to call for an appt. Provider is in [facility] and pt lives in [place]. Would prefer a mental health provider in [place]. Has not talked with PCP about this. CAn't take hydroxyzine , it makes her anxiety worse.   Wants to know most recent thyroid  test results. I do not see any - looks like test wasn't ordered quite right or for whatever reason was  not copmleted. TSH in July was low. She is woried she has a thyroid  problem. I see a thyroid  US  from April with nodules that need 1 yr f/u.   HPI: HPI  Problems:  Patient Active Problem List   Diagnosis Date Noted   Pre-eclampsia 03/08/2024   Obesity (BMI 30-39.9) 03/08/2024   Encounter for counseling regarding contraception 03/08/2024   History of gestational diabetes 08/13/2022   Encounter for monitoring Subutex  maintenance therapy 08/03/2022   GBS (group B  Streptococcus carrier), +RV culture, currently pregnant 04/09/2022   History of hepatitis C 04/08/2022   Vacuum-assisted vaginal delivery 04/07/2022   Thyroid  condition 04/07/2022   GDM, class A2 04/07/2022   Echogenic focus of bowel of fetus affecting management of mother in singleton pregnancy, antepartum 02/06/2022   Asthmatic bronchitis, mild intermittent, with acute exacerbation 01/21/2022   Nicotine  dependence, cigarettes, uncomplicated 01/21/2022   Echogenic bowel of fetus on prenatal ultrasound 11/19/2021   Late prenatal care 10/22/2021   Seasonal allergic rhinitis 05/07/2021   Abdominal mass 03/19/2021   Abnormal liver function tests 03/19/2021   Acute pelvic pain 03/19/2021   Menorrhagia with irregular cycle 01/14/2021   Multiple thyroid  nodules 01/14/2021   Postpartum major depression in remission 04/03/2020   Thyrotoxicosis 02/06/2020   Pregnancy complicated by subutex  maintenance, antepartum (HCC) 02/06/2020   Pap smear of cervix with ASCUS, cannot exclude HGSIL 09/01/2019   Opioid use disorder, moderate, in sustained remission, on maintenance therapy (HCC) 08/24/2019   Rh negative, antepartum 08/20/2019   History of pre-eclampsia 08/18/2019   Ovarian cyst 07/24/2019   History of completed stroke 07/12/2019   Smoker 07/12/2019   Moderate episode of recurrent major depressive disorder (HCC) 03/08/2019   Severe benzodiazepine use disorder (HCC) 09/21/2018   Seizure disorder (HCC) 06/22/2017   Generalized anxiety disorder with panic attacks 07/23/2016   Memory loss 03/28/2013   Cerebral vasospasm 03/10/2012   Anemia 03/10/2012   Subarachnoid hemorrhage (HCC) 03/03/2012    Allergies:  Allergies  Allergen Reactions   Morphine Anaphylaxis and Rash   Hydrocodone-Acetaminophen  Itching and Other (See Comments)    Reaction not specified by patient Reaction not specified by pat    Naloxone Nausea And Vomiting   Varenicline Other (See Comments)    headaches   Xyzal  [Levocetirizine] Anxiety    Panic attack   Medications:  Current Outpatient Medications:    cyclobenzaprine  (FLEXERIL ) 5 MG tablet, Take 1 tablet (5 mg total) by mouth 3 (three) times daily as needed for muscle spasms., Disp: 21 tablet, Rfl: 0   albuterol  (VENTOLIN  HFA) 108 (90 Base) MCG/ACT inhaler, Inhale 1-2 puffs into the lungs every 6 (six) hours as needed., Disp: 8 g, Rfl: 0   brompheniramine-pseudoephedrine-DM 30-2-10 MG/5ML syrup, Take 5 mLs by mouth 4 (four) times daily as needed. (Patient not taking: Reported on 05/19/2024), Disp: 120 mL, Rfl: 0   buprenorphine  (SUBUTEX ) 8 MG SUBL SL tablet, Place 24 mg under the tongue daily as needed for withdrawal. (Patient taking differently: Place 8 mg under the tongue in the morning, at noon, and at bedtime.), Disp: , Rfl:    cyclobenzaprine  (FLEXERIL ) 10 MG tablet, Take 0.5-1 tablets (5-10 mg total) by mouth 3 (three) times daily as needed. (Patient not taking: Reported on 05/19/2024), Disp: 30 tablet, Rfl: 0   ferrous sulfate  325 (65 FE) MG EC tablet, Take by mouth. (Patient not taking: Reported on 05/19/2024), Disp: , Rfl:    fluticasone  (FLONASE ) 50 MCG/ACT nasal spray, Place 2 sprays into both nostrils daily.,  Disp: 16 g, Rfl: 0   gabapentin  (NEURONTIN ) 600 MG tablet, Take 600 mg by mouth 3 (three) times daily.  (Patient taking differently: Take 400 mg by mouth 3 (three) times daily.), Disp: , Rfl:    hydrOXYzine  (VISTARIL ) 25 MG capsule, Take 1 capsule (25 mg total) by mouth every 8 (eight) hours as needed., Disp: 30 capsule, Rfl: 0   ibuprofen  (ADVIL ) 600 MG tablet, Take 1 tablet (600 mg total) by mouth every 8 (eight) hours as needed., Disp: 30 tablet, Rfl: 0   loratadine  (CLARITIN ) 10 MG tablet, Take 1 tablet (10 mg total) by mouth daily., Disp: 90 tablet, Rfl: 3   Menthol , Topical Analgesic, (BIOFREEZE COOL THE PAIN LARGE) 5 % PTCH, Use 1 patch every 12 hours as needed, Disp: 14 patch, Rfl: 0   methylPREDNISolone  (MEDROL  DOSEPAK) 4 MG TBPK  tablet, 6 day taper; take as directed on package instructions, Disp: 21 tablet, Rfl: 0   Multiple Vitamins-Minerals (WOMENS MULTIVITAMIN PO), Take 1 tablet by mouth daily in the afternoon., Disp: , Rfl:    naproxen  (NAPROSYN ) 500 MG tablet, Take 1 tablet (500 mg total) by mouth 2 (two) times daily with a meal., Disp: 30 tablet, Rfl: 0   Norethindrone  Acetate-Ethinyl Estradiol  (LOESTRIN) 1.5-30 MG-MCG tablet, Take 1 tablet by mouth daily., Disp: 28 tablet, Rfl: 3   ondansetron  (ZOFRAN -ODT) 4 MG disintegrating tablet, Take 1 tablet (4 mg total) by mouth every 8 (eight) hours as needed., Disp: 20 tablet, Rfl: 0   phentermine  15 MG capsule, Take 1 capsule (15 mg total) by mouth every morning., Disp: 30 capsule, Rfl: 0   Prenatal Vit-Fe Fumarate-FA (M-NATAL PLUS) 27-1 MG TABS, Take 1 tablet by mouth daily., Disp: , Rfl:   Observations/Objective: Patient is well-developed, well-nourished in no acute distress.  Resting comfortably  at home.  Head is normocephalic, atraumatic.  No labored breathing.  Speech is clear and coherent with logical content.  Patient is alert and oriented at baseline.    Assessment and Plan: 1. Acute bilateral thoracic back pain (Primary)  2. Anxiety  Advisedher to talk with PCP abhout mental health options in [place].   I would like her to go to sports medicine for back pain. I have rx flexeril  as requested. At end of appt, she also asked for something other than naproxen . I rx ibuprofen   Follow Up Instructions: I discussed the assessment and treatment plan with the patient. The patient was provided an opportunity to ask questions and all were answered. The patient agreed with the plan and demonstrated an understanding of the instructions.  A copy of instructions were sent to the patient via MyChart unless otherwise noted below.   The patient was advised to call back or seek an in-person evaluation if the symptoms worsen or if the condition fails to improve as  anticipated.    Dawn CHRISTELLA Belt, NP

## 2024-07-20 NOTE — Patient Instructions (Addendum)
 [name], thank you for joining Jon CHRISTELLA Belt, NP for today's virtual visit.  While this provider is not your primary care provider (PCP), if your PCP is located in our provider database this encounter information will be shared with them immediately following your visit.   A [facility] MyChart account gives you access to today's visit and all your visits, tests, and labs performed at [facility]  click here if you don't have a [facility] MyChart account or go to [email or web address]  Consent: (Patient) Dawn Davies provided verbal consent for this virtual visit at the beginning of the encounter.  Current Medications:  Current Outpatient Medications:    cyclobenzaprine  (FLEXERIL ) 5 MG tablet, Take 1 tablet (5 mg total) by mouth 3 (three) times daily as needed for muscle spasms., Disp: 21 tablet, Rfl: 0   albuterol  (VENTOLIN  HFA) 108 (90 Base) MCG/ACT inhaler, Inhale 1-2 puffs into the lungs every 6 (six) hours as needed., Disp: 8 g, Rfl: 0   brompheniramine-pseudoephedrine-DM 30-2-10 MG/5ML syrup, Take 5 mLs by mouth 4 (four) times daily as needed. (Patient not taking: Reported on 05/19/2024), Disp: 120 mL, Rfl: 0   buprenorphine  (SUBUTEX ) 8 MG SUBL SL tablet, Place 24 mg under the tongue daily as needed for withdrawal. (Patient taking differently: Place 8 mg under the tongue in the morning, at noon, and at bedtime.), Disp: , Rfl:    cyclobenzaprine  (FLEXERIL ) 10 MG tablet, Take 0.5-1 tablets (5-10 mg total) by mouth 3 (three) times daily as needed. (Patient not taking: Reported on 05/19/2024), Disp: 30 tablet, Rfl: 0   ferrous sulfate  325 (65 FE) MG EC tablet, Take by mouth. (Patient not taking: Reported on 05/19/2024), Disp: , Rfl:    fluticasone  (FLONASE ) 50 MCG/ACT nasal spray, Place 2 sprays into both nostrils daily., Disp: 16 g, Rfl: 0   gabapentin  (NEURONTIN ) 600 MG tablet, Take 600 mg by mouth 3 (three) times daily.  (Patient taking differently: Take 400 mg by mouth  3 (three) times daily.), Disp: , Rfl:    hydrOXYzine  (VISTARIL ) 25 MG capsule, Take 1 capsule (25 mg total) by mouth every 8 (eight) hours as needed., Disp: 30 capsule, Rfl: 0   ibuprofen  (ADVIL ) 600 MG tablet, Take 1 tablet (600 mg total) by mouth every 8 (eight) hours as needed., Disp: 30 tablet, Rfl: 0   loratadine  (CLARITIN ) 10 MG tablet, Take 1 tablet (10 mg total) by mouth daily., Disp: 90 tablet, Rfl: 3   Menthol , Topical Analgesic, (BIOFREEZE COOL THE PAIN LARGE) 5 % PTCH, Use 1 patch every 12 hours as needed, Disp: 14 patch, Rfl: 0   methylPREDNISolone  (MEDROL  DOSEPAK) 4 MG TBPK tablet, 6 day taper; take as directed on package instructions, Disp: 21 tablet, Rfl: 0   Multiple Vitamins-Minerals (WOMENS MULTIVITAMIN PO), Take 1 tablet by mouth daily in the afternoon., Disp: , Rfl:    naproxen  (NAPROSYN ) 500 MG tablet, Take 1 tablet (500 mg total) by mouth 2 (two) times daily with a meal., Disp: 30 tablet, Rfl: 0   Norethindrone  Acetate-Ethinyl Estradiol  (LOESTRIN) 1.5-30 MG-MCG tablet, Take 1 tablet by mouth daily., Disp: 28 tablet, Rfl: 3   ondansetron  (ZOFRAN -ODT) 4 MG disintegrating tablet, Take 1 tablet (4 mg total) by mouth every 8 (eight) hours as needed., Disp: 20 tablet, Rfl: 0   phentermine  15 MG capsule, Take 1 capsule (15 mg total) by mouth every morning., Disp: 30 capsule, Rfl: 0   Prenatal Vit-Fe Fumarate-FA (M-NATAL PLUS) 27-1 MG TABS, Take 1 tablet by mouth daily., Disp: ,  Rfl:    Medications ordered in this encounter:  Meds ordered this encounter  Medications   cyclobenzaprine  (FLEXERIL ) 5 MG tablet    Sig: Take 1 tablet (5 mg total) by mouth 3 (three) times daily as needed for muscle spasms.    Dispense:  21 tablet    Refill:  0   ibuprofen  (ADVIL ) 600 MG tablet    Sig: Take 1 tablet (600 mg total) by mouth every 8 (eight) hours as needed.    Dispense:  30 tablet    Refill:  0     *If you need refills on other medications prior to your next appointment, please contact  your pharmacy*  Follow-Up: Call back or seek an in-person evaluation if the symptoms worsen or if the condition fails to improve as anticipated.  [facility] [phone]  Other Instructions  Please follow up with Greig Drones, NP about your anxiety. She can refer you to mental health here in [place].   I recommend you see a Sports Medicine clinic for your back pain. I have listed 2 clinics below. You could go to a different one if you prefer.   Sports Medicine Clinics in [facility]:   [facility] Sports Medicine:  [address] [place], [place] [address] [phone]  [facility] Sports Medicine at [facility] [address] [place], [place] [address] [phone]    If you have been instructed to have an in-person evaluation today at a local Urgent Care facility, please use the link below. It will take you to a list of all of our available [place] Urgent Cares, including address, phone number and hours of operation. Please do not delay care.  [facility]  If you or a family member do not have a primary care provider, use the link below to schedule a visit and establish care. When you choose a [facility] primary care physician or advanced practice provider, you gain a long-term partner in health. Find a Primary Care Provider  Learn more about [facility]'s in-office and virtual care options: [place] - Get Care Now

## 2024-07-21 ENCOUNTER — Telehealth: Payer: Self-pay

## 2024-07-21 NOTE — Telephone Encounter (Signed)
 The number provided was not to [employer]. Unable to call back   Copied from CRM #[id number]. Topic: General - Other >> Jul 20, 2024  3:58 PM Tonda B wrote: Reason for CRM:  [employer] is calling in asking to speak to a nurse about extra sample that was sent in for the patient please [phone]

## 2024-07-25 ENCOUNTER — Other Ambulatory Visit: Payer: Self-pay | Admitting: Family

## 2024-07-25 ENCOUNTER — Ambulatory Visit: Payer: Self-pay | Admitting: Family

## 2024-07-25 DIAGNOSIS — E041 Nontoxic single thyroid nodule: Secondary | ICD-10-CM

## 2024-07-26 ENCOUNTER — Other Ambulatory Visit (INDEPENDENT_AMBULATORY_CARE_PROVIDER_SITE_OTHER): Payer: MEDICAID

## 2024-07-26 DIAGNOSIS — E041 Nontoxic single thyroid nodule: Secondary | ICD-10-CM | POA: Diagnosis not present

## 2024-07-27 LAB — TSH+T4F+T3FREE
Free T4: 1.25 ng/dL (ref 0.82–1.77)
T3, Free: 3.9 pg/mL (ref 2.0–4.4)
TSH: 1.16 u[IU]/mL (ref 0.450–4.500)

## 2024-07-28 ENCOUNTER — Ambulatory Visit: Payer: Self-pay | Admitting: Family

## 2024-08-18 ENCOUNTER — Telehealth: Payer: MEDICAID

## 2024-08-20 ENCOUNTER — Telehealth: Payer: MEDICAID

## 2024-08-21 ENCOUNTER — Other Ambulatory Visit (HOSPITAL_BASED_OUTPATIENT_CLINIC_OR_DEPARTMENT_OTHER): Payer: Self-pay | Admitting: Family Medicine

## 2024-08-21 ENCOUNTER — Telehealth: Payer: MEDICAID | Admitting: Family Medicine

## 2024-08-21 DIAGNOSIS — J069 Acute upper respiratory infection, unspecified: Secondary | ICD-10-CM

## 2024-08-21 MED ORDER — PSEUDOEPH-BROMPHEN-DM 30-2-10 MG/5ML PO SYRP: 5.0000 mL | ORAL_SOLUTION | Freq: Four times a day (QID) | ORAL | 0 refills | Status: DC | PRN

## 2024-08-21 MED ORDER — DOXYCYCLINE HYCLATE 100 MG PO TABS: 100.0000 mg | ORAL_TABLET | Freq: Two times a day (BID) | ORAL | 0 refills | Status: AC

## 2024-08-21 MED ORDER — ALBUTEROL SULFATE HFA 108 (90 BASE) MCG/ACT IN AERS: 1.0000 | INHALATION_SPRAY | Freq: Four times a day (QID) | RESPIRATORY_TRACT | 0 refills | Status: AC | PRN

## 2024-08-21 MED ORDER — PROMETHAZINE-DM 6.25-15 MG/5ML PO SYRP: 5.0000 mL | ORAL_SOLUTION | Freq: Four times a day (QID) | ORAL | 0 refills | Status: AC | PRN

## 2024-08-21 MED ORDER — PREDNISONE 20 MG PO TABS: 20.0000 mg | ORAL_TABLET | Freq: Two times a day (BID) | ORAL | 0 refills | Status: AC

## 2024-08-21 NOTE — Progress Notes (Signed)
 Virtual Visit Consent   Dawn Davies, you are scheduled for a virtual visit with a [facility] provider today. Just as with appointments in the office, your consent must be obtained to participate. Your consent will be active for this visit and any virtual visit you may have with one of our providers in the next 365 days. If you have a MyChart account, a copy of this consent can be sent to you electronically.  As this is a virtual visit, video technology does not allow for your provider to perform a traditional examination. This may limit your provider's ability to fully assess your condition. If your provider identifies any concerns that need to be evaluated in person or the need to arrange testing (such as labs, EKG, etc.), we will make arrangements to do so. Although advances in technology are sophisticated, we cannot ensure that it will always work on either your end or our end. If the connection with a video visit is poor, the visit may have to be switched to a telephone visit. With either a video or telephone visit, we are not always able to ensure that we have a secure connection.  By engaging in this virtual visit, you consent to the provision of healthcare and authorize for your insurance to be billed (if applicable) for the services provided during this visit. Depending on your insurance coverage, you may receive a charge related to this service.  I need to obtain your verbal consent now. Are you willing to proceed with your visit today? Dawn Davies has provided verbal consent on 08/21/2024 for a virtual visit (video or telephone). Dawn CHRISTELLA Barefoot, NP  Date: 08/21/2024 3:47 PM   Virtual Visit via Video Note   I, Dawn Davies, connected with  Dawn Davies  ([id number], 11-28-1981) on 08/21/24 at  3:45 PM EDT by a video-enabled telemedicine application and verified that I am speaking with the correct person using two identifiers.  Location: Patient: Virtual Visit Location Patient:  Home Provider: Virtual Visit Location Provider: Home Office   I discussed the limitations of evaluation and management by telemedicine and the availability of in person appointments. The patient expressed understanding and agreed to proceed.    History of Present Illness: Dawn Davies is a 33 y.o. who identifies as a female who was assigned female at birth, and is being seen today for cough  Onset was 4 days ago- congestion and dry cough (but feels congestion- green mucus nasal, cough is keeping her up at night Associated symptoms are shortness of breath due to congestion  Modifying factors are OTC DM cough and congestion, Robitussin, Mucinex  Denies chest pain, fevers, chills  Exposure to sick contacts- son and mother  COVID test: covid and flu OTC home test neg    Problems:  Patient Active Problem List   Diagnosis Date Noted   Pre-eclampsia 03/08/2024   Obesity (BMI 30-39.9) 03/08/2024   Encounter for counseling regarding contraception 03/08/2024   History of gestational diabetes 08/13/2022   Encounter for monitoring Subutex  maintenance therapy 08/03/2022   GBS (group B Streptococcus carrier), +RV culture, currently pregnant 04/09/2022   History of hepatitis C 04/08/2022   Vacuum-assisted vaginal delivery 04/07/2022   Thyroid  condition 04/07/2022   GDM, class A2 04/07/2022   Echogenic focus of bowel of fetus affecting management of mother in singleton pregnancy, antepartum 02/06/2022   Asthmatic bronchitis, mild intermittent, with acute exacerbation 01/21/2022   Nicotine  dependence, cigarettes, uncomplicated 01/21/2022   Echogenic bowel of fetus on prenatal ultrasound  11/19/2021   Late prenatal care 10/22/2021   Seasonal allergic rhinitis 05/07/2021   Abdominal mass 03/19/2021   Abnormal liver function tests 03/19/2021   Acute pelvic pain 03/19/2021   Menorrhagia with irregular cycle 01/14/2021   Multiple thyroid  nodules 01/14/2021   Postpartum major depression in  remission 04/03/2020   Thyrotoxicosis 02/06/2020   Pregnancy complicated by subutex  maintenance, antepartum (HCC) 02/06/2020   Pap smear of cervix with ASCUS, cannot exclude HGSIL 09/01/2019   Opioid use disorder, moderate, in sustained remission, on maintenance therapy (HCC) 08/24/2019   Rh negative, antepartum 08/20/2019   History of pre-eclampsia 08/18/2019   Ovarian cyst 07/24/2019   History of completed stroke 07/12/2019   Smoker 07/12/2019   Moderate episode of recurrent major depressive disorder (HCC) 03/08/2019   Severe benzodiazepine use disorder (HCC) 09/21/2018   Seizure disorder (HCC) 06/22/2017   Generalized anxiety disorder with panic attacks 07/23/2016   Memory loss 03/28/2013   Cerebral vasospasm 03/10/2012   Anemia 03/10/2012   Subarachnoid hemorrhage (HCC) 03/03/2012    Allergies:  Allergies  Allergen Reactions   Morphine Anaphylaxis and Rash   Hydrocodone-Acetaminophen  Itching and Other (See Comments)    Reaction not specified by patient Reaction not specified by pat    Naloxone Nausea And Vomiting   Varenicline Other (See Comments)    headaches   Xyzal [Levocetirizine] Anxiety    Panic attack   Medications:  Current Outpatient Medications:    albuterol  (VENTOLIN  HFA) 108 (90 Base) MCG/ACT inhaler, Inhale 1-2 puffs into the lungs every 6 (six) hours as needed., Disp: 8 g, Rfl: 0   brompheniramine-pseudoephedrine-DM 30-2-10 MG/5ML syrup, Take 5 mLs by mouth 4 (four) times daily as needed. (Patient not taking: Reported on 05/19/2024), Disp: 120 mL, Rfl: 0   buprenorphine  (SUBUTEX ) 8 MG SUBL SL tablet, Place 24 mg under the tongue daily as needed for withdrawal. (Patient taking differently: Place 8 mg under the tongue in the morning, at noon, and at bedtime.), Disp: , Rfl:    cyclobenzaprine  (FLEXERIL ) 10 MG tablet, Take 0.5-1 tablets (5-10 mg total) by mouth 3 (three) times daily as needed. (Patient not taking: Reported on 05/19/2024), Disp: 30 tablet, Rfl: 0    cyclobenzaprine  (FLEXERIL ) 5 MG tablet, Take 1 tablet (5 mg total) by mouth 3 (three) times daily as needed for muscle spasms., Disp: 21 tablet, Rfl: 0   ferrous sulfate  325 (65 FE) MG EC tablet, Take by mouth. (Patient not taking: Reported on 05/19/2024), Disp: , Rfl:    fluticasone  (FLONASE ) 50 MCG/ACT nasal spray, Place 2 sprays into both nostrils daily., Disp: 16 g, Rfl: 0   gabapentin  (NEURONTIN ) 600 MG tablet, Take 600 mg by mouth 3 (three) times daily.  (Patient taking differently: Take 400 mg by mouth 3 (three) times daily.), Disp: , Rfl:    hydrOXYzine  (VISTARIL ) 25 MG capsule, Take 1 capsule (25 mg total) by mouth every 8 (eight) hours as needed., Disp: 30 capsule, Rfl: 0   ibuprofen  (ADVIL ) 600 MG tablet, Take 1 tablet (600 mg total) by mouth every 8 (eight) hours as needed., Disp: 30 tablet, Rfl: 0   loratadine  (CLARITIN ) 10 MG tablet, Take 1 tablet (10 mg total) by mouth daily., Disp: 90 tablet, Rfl: 3   Menthol , Topical Analgesic, (BIOFREEZE COOL THE PAIN LARGE) 5 % PTCH, Use 1 patch every 12 hours as needed, Disp: 14 patch, Rfl: 0   methylPREDNISolone  (MEDROL  DOSEPAK) 4 MG TBPK tablet, 6 day taper; take as directed on package instructions, Disp: 21 tablet,  Rfl: 0   Multiple Vitamins-Minerals (WOMENS MULTIVITAMIN PO), Take 1 tablet by mouth daily in the afternoon., Disp: , Rfl:    naproxen  (NAPROSYN ) 500 MG tablet, Take 1 tablet (500 mg total) by mouth 2 (two) times daily with a meal., Disp: 30 tablet, Rfl: 0   Norethindrone  Acetate-Ethinyl Estradiol  (LOESTRIN) 1.5-30 MG-MCG tablet, Take 1 tablet by mouth daily., Disp: 28 tablet, Rfl: 3   ondansetron  (ZOFRAN -ODT) 4 MG disintegrating tablet, Take 1 tablet (4 mg total) by mouth every 8 (eight) hours as needed., Disp: 20 tablet, Rfl: 0   phentermine  15 MG capsule, Take 1 capsule (15 mg total) by mouth every morning., Disp: 30 capsule, Rfl: 0   Prenatal Vit-Fe Fumarate-FA (M-NATAL PLUS) 27-1 MG TABS, Take 1 tablet by mouth daily., Disp: , Rfl:    Observations/Objective: Patient is well-developed, well-nourished in no acute distress.  Resting comfortably  at home.  Head is normocephalic, atraumatic.  No labored breathing.  Speech is clear and coherent with logical content.  Patient is alert and oriented at baseline.  Cough present  Assessment and Plan:   1. Viral URI with cough (Primary)  - brompheniramine-pseudoephedrine-DM 30-2-10 MG/5ML syrup; Take 5 mLs by mouth 4 (four) times daily as needed.  Dispense: 120 mL; Refill: 0 - albuterol  (VENTOLIN  HFA) 108 (90 Base) MCG/ACT inhaler; Inhale 1-2 puffs into the lungs every 6 (six) hours as needed for wheezing or shortness of breath (cough).  Dispense: 8 g; Refill: 0  URI recommendations: - Increased rest - Increasing Fluids - Acetaminophen  / ibuprofen  as needed for fever/pain.  - Salt water gargling, chloraseptic spray and throat lozenges - Mucinex if mucus is present and increasing.  - Saline nasal spray if congestion or if nasal passages feel dry. - Humidifying the air.   Reviewed side effects, risks and benefits of medication.    Patient acknowledged agreement and understanding of the plan.   Past Medical, Surgical, Social History, Allergies, and Medications have been Reviewed.    Follow Up Instructions: I discussed the assessment and treatment plan with the patient. The patient was provided an opportunity to ask questions and all were answered. The patient agreed with the plan and demonstrated an understanding of the instructions.  A copy of instructions were sent to the patient via MyChart unless otherwise noted below.     The patient was advised to call back or seek an in-person evaluation if the symptoms worsen or if the condition fails to improve as anticipated.    Dawn CHRISTELLA Barefoot, NP

## 2024-08-21 NOTE — Patient Instructions (Addendum)
 [name], thank you for joining Chiquita CHRISTELLA Barefoot, NP for today's virtual visit.  While this provider is not your primary care provider (PCP), if your PCP is located in our provider database this encounter information will be shared with them immediately following your visit.   A [facility] MyChart account gives you access to today's visit and all your visits, tests, and labs performed at [facility]  click here if you don't have a [facility] MyChart account or go to [email or web address]  Consent: (Patient) Miyeko Mahlum provided verbal consent for this virtual visit at the beginning of the encounter.  Current Medications:  Current Outpatient Medications:    albuterol  (VENTOLIN  HFA) 108 (90 Base) MCG/ACT inhaler, Inhale 1-2 puffs into the lungs every 6 (six) hours as needed for wheezing or shortness of breath (cough)., Disp: 8 g, Rfl: 0   brompheniramine-pseudoephedrine-DM 30-2-10 MG/5ML syrup, Take 5 mLs by mouth 4 (four) times daily as needed., Disp: 120 mL, Rfl: 0   buprenorphine  (SUBUTEX ) 8 MG SUBL SL tablet, Place 24 mg under the tongue daily as needed for withdrawal. (Patient taking differently: Place 8 mg under the tongue in the morning, at noon, and at bedtime.), Disp: , Rfl:    cyclobenzaprine  (FLEXERIL ) 10 MG tablet, Take 0.5-1 tablets (5-10 mg total) by mouth 3 (three) times daily as needed. (Patient not taking: Reported on 05/19/2024), Disp: 30 tablet, Rfl: 0   cyclobenzaprine  (FLEXERIL ) 5 MG tablet, Take 1 tablet (5 mg total) by mouth 3 (three) times daily as needed for muscle spasms., Disp: 21 tablet, Rfl: 0   ferrous sulfate  325 (65 FE) MG EC tablet, Take by mouth. (Patient not taking: Reported on 05/19/2024), Disp: , Rfl:    fluticasone  (FLONASE ) 50 MCG/ACT nasal spray, Place 2 sprays into both nostrils daily., Disp: 16 g, Rfl: 0   gabapentin  (NEURONTIN ) 600 MG tablet, Take 600 mg by mouth 3 (three) times daily.  (Patient taking differently: Take 400 mg by mouth  3 (three) times daily.), Disp: , Rfl:    hydrOXYzine  (VISTARIL ) 25 MG capsule, Take 1 capsule (25 mg total) by mouth every 8 (eight) hours as needed., Disp: 30 capsule, Rfl: 0   ibuprofen  (ADVIL ) 600 MG tablet, Take 1 tablet (600 mg total) by mouth every 8 (eight) hours as needed., Disp: 30 tablet, Rfl: 0   loratadine  (CLARITIN ) 10 MG tablet, Take 1 tablet (10 mg total) by mouth daily., Disp: 90 tablet, Rfl: 3   Menthol , Topical Analgesic, (BIOFREEZE COOL THE PAIN LARGE) 5 % PTCH, Use 1 patch every 12 hours as needed, Disp: 14 patch, Rfl: 0   methylPREDNISolone  (MEDROL  DOSEPAK) 4 MG TBPK tablet, 6 day taper; take as directed on package instructions, Disp: 21 tablet, Rfl: 0   Multiple Vitamins-Minerals (WOMENS MULTIVITAMIN PO), Take 1 tablet by mouth daily in the afternoon., Disp: , Rfl:    naproxen  (NAPROSYN ) 500 MG tablet, Take 1 tablet (500 mg total) by mouth 2 (two) times daily with a meal., Disp: 30 tablet, Rfl: 0   Norethindrone  Acetate-Ethinyl Estradiol  (LOESTRIN) 1.5-30 MG-MCG tablet, Take 1 tablet by mouth daily., Disp: 28 tablet, Rfl: 3   ondansetron  (ZOFRAN -ODT) 4 MG disintegrating tablet, Take 1 tablet (4 mg total) by mouth every 8 (eight) hours as needed., Disp: 20 tablet, Rfl: 0   phentermine  15 MG capsule, Take 1 capsule (15 mg total) by mouth every morning., Disp: 30 capsule, Rfl: 0   Prenatal Vit-Fe Fumarate-FA (M-NATAL PLUS) 27-1 MG TABS, Take 1 tablet by mouth daily.,  Disp: , Rfl:    Medications ordered in this encounter:  Meds ordered this encounter  Medications   brompheniramine-pseudoephedrine-DM 30-2-10 MG/5ML syrup    Sig: Take 5 mLs by mouth 4 (four) times daily as needed.    Dispense:  120 mL    Refill:  0    Supervising Provider:   BLAISE ALEENE KIDD [[id number]]   albuterol  (VENTOLIN  HFA) 108 (90 Base) MCG/ACT inhaler    Sig: Inhale 1-2 puffs into the lungs every 6 (six) hours as needed for wheezing or shortness of breath (cough).    Dispense:  8 g    Refill:  0     Supervising Provider:   BLAISE ALEENE KIDD [[id number]]     *If you need refills on other medications prior to your next appointment, please contact your pharmacy*  Follow-Up: Call back or seek an in-person evaluation if the symptoms worsen or if the condition fails to improve as anticipated.  [facility] [phone]  Other Instructions URI recommendations: - Increased rest - Increasing Fluids - Acetaminophen  / ibuprofen  as needed for fever/pain.  - Salt water gargling, chloraseptic spray and throat lozenges - Mucinex if mucus is present and increasing.  - Saline nasal spray if congestion or if nasal passages feel dry. - Humidifying the air.    If you have been instructed to have an in-person evaluation today at a local Urgent Care facility, please use the link below. It will take you to a list of all of our available [facility]gent [facility], including address, phone number and hours of operation. Please do not delay care.  [facility]  If you or a family member do not have a primary care provider, use the link below to schedule a visit and establish care. When you choose a [facility] primary care physician or advanced practice provider, you gain a long-term partner in health. Find a Primary Care Provider  Learn more about [facility]'s in-office and virtual care options: [facility] - Get Care Now

## 2024-08-21 NOTE — Patient Instructions (Signed)

## 2024-08-21 NOTE — Progress Notes (Signed)
 Virtual Visit Consent   Dawn Davies, you are scheduled for a virtual visit with a [facility] provider today. Just as with appointments in the office, your consent must be obtained to participate. Your consent will be active for this visit and any virtual visit you may have with one of our providers in the next 365 days. If you have a MyChart account, a copy of this consent can be sent to you electronically.  As this is a virtual visit, video technology does not allow for your provider to perform a traditional examination. This may limit your provider's ability to fully assess your condition. If your provider identifies any concerns that need to be evaluated in person or the need to arrange testing (such as labs, EKG, etc.), we will make arrangements to do so. Although advances in technology are sophisticated, we cannot ensure that it will always work on either your end or our end. If the connection with a video visit is poor, the visit may have to be switched to a telephone visit. With either a video or telephone visit, we are not always able to ensure that we have a secure connection.  By engaging in this virtual visit, you consent to the provision of healthcare and authorize for your insurance to be billed (if applicable) for the services provided during this visit. Depending on your insurance coverage, you may receive a charge related to this service.  I need to obtain your verbal consent now. Are you willing to proceed with your visit today? Dawn Davies has provided verbal consent on 08/21/2024 for a virtual visit (video or telephone). Loa Lamp, FNP  Date: 08/21/2024 5:48 PM   Virtual Visit via Video Note   I, Loa Lamp, connected with  Dawn Davies  ([id number], 03/21/1991) on 08/21/24 at  5:45 PM EDT by a video-enabled telemedicine application and verified that I am speaking with the correct person using two identifiers.  Location: Patient: Virtual Visit Location Patient:  Home Provider: Virtual Visit Location Provider: Home Office   I discussed the limitations of evaluation and management by telemedicine and the availability of in person appointments. The patient expressed understanding and agreed to proceed.    History of Present Illness: Dawn Davies is a 33 y.o. who identifies as a female who was assigned female at birth, and is being seen today for cough, congestion, wheezing, green mucus, cough worse at night, no fever. She was seen earlier today but insists she needs prednisone  ,cough meds and an antibiotic that previous provider was to send. She says sx started 6 days ago last Wednesday.   HPI: HPI  Problems:  Patient Active Problem List   Diagnosis Date Noted   Pre-eclampsia 03/08/2024   Obesity (BMI 30-39.9) 03/08/2024   Encounter for counseling regarding contraception 03/08/2024   History of gestational diabetes 08/13/2022   Encounter for monitoring Subutex  maintenance therapy 08/03/2022   GBS (group B Streptococcus carrier), +RV culture, currently pregnant 04/09/2022   History of hepatitis C 04/08/2022   Vacuum-assisted vaginal delivery 04/07/2022   Thyroid  condition 04/07/2022   GDM, class A2 04/07/2022   Echogenic focus of bowel of fetus affecting management of mother in singleton pregnancy, antepartum 02/06/2022   Asthmatic bronchitis, mild intermittent, with acute exacerbation 01/21/2022   Nicotine  dependence, cigarettes, uncomplicated 01/21/2022   Echogenic bowel of fetus on prenatal ultrasound 11/19/2021   Late prenatal care 10/22/2021   Seasonal allergic rhinitis 05/07/2021   Abdominal mass 03/19/2021   Abnormal liver function tests 03/19/2021  Acute pelvic pain 03/19/2021   Menorrhagia with irregular cycle 01/14/2021   Multiple thyroid  nodules 01/14/2021   Postpartum major depression in remission 04/03/2020   Thyrotoxicosis 02/06/2020   Pregnancy complicated by subutex  maintenance, antepartum (HCC) 02/06/2020   Pap smear of  cervix with ASCUS, cannot exclude HGSIL 09/01/2019   Opioid use disorder, moderate, in sustained remission, on maintenance therapy (HCC) 08/24/2019   Rh negative, antepartum 08/20/2019   History of pre-eclampsia 08/18/2019   Ovarian cyst 07/24/2019   History of completed stroke 07/12/2019   Smoker 07/12/2019   Moderate episode of recurrent major depressive disorder (HCC) 03/08/2019   Severe benzodiazepine use disorder (HCC) 09/21/2018   Seizure disorder (HCC) 06/22/2017   Generalized anxiety disorder with panic attacks 07/23/2016   Memory loss 03/28/2013   Cerebral vasospasm 03/10/2012   Anemia 03/10/2012   Subarachnoid hemorrhage (HCC) 03/03/2012    Allergies:  Allergies  Allergen Reactions   Morphine Anaphylaxis and Rash   Hydrocodone-Acetaminophen  Itching and Other (See Comments)    Reaction not specified by patient Reaction not specified by pat    Naloxone Nausea And Vomiting   Varenicline Other (See Comments)    headaches   Xyzal [Levocetirizine] Anxiety    Panic attack   Medications:  Current Outpatient Medications:    albuterol  (VENTOLIN  HFA) 108 (90 Base) MCG/ACT inhaler, Inhale 1-2 puffs into the lungs every 6 (six) hours as needed for wheezing or shortness of breath (cough)., Disp: 8 g, Rfl: 0   brompheniramine-pseudoephedrine-DM 30-2-10 MG/5ML syrup, Take 5 mLs by mouth 4 (four) times daily as needed., Disp: 120 mL, Rfl: 0   buprenorphine  (SUBUTEX ) 8 MG SUBL SL tablet, Place 24 mg under the tongue daily as needed for withdrawal. (Patient taking differently: Place 8 mg under the tongue in the morning, at noon, and at bedtime.), Disp: , Rfl:    cyclobenzaprine  (FLEXERIL ) 10 MG tablet, Take 0.5-1 tablets (5-10 mg total) by mouth 3 (three) times daily as needed. (Patient not taking: Reported on 05/19/2024), Disp: 30 tablet, Rfl: 0   cyclobenzaprine  (FLEXERIL ) 5 MG tablet, Take 1 tablet (5 mg total) by mouth 3 (three) times daily as needed for muscle spasms., Disp: 21 tablet,  Rfl: 0   ferrous sulfate  325 (65 FE) MG EC tablet, Take by mouth. (Patient not taking: Reported on 05/19/2024), Disp: , Rfl:    fluticasone  (FLONASE ) 50 MCG/ACT nasal spray, Place 2 sprays into both nostrils daily., Disp: 16 g, Rfl: 0   gabapentin  (NEURONTIN ) 600 MG tablet, Take 600 mg by mouth 3 (three) times daily.  (Patient taking differently: Take 400 mg by mouth 3 (three) times daily.), Disp: , Rfl:    hydrOXYzine  (VISTARIL ) 25 MG capsule, Take 1 capsule (25 mg total) by mouth every 8 (eight) hours as needed., Disp: 30 capsule, Rfl: 0   ibuprofen  (ADVIL ) 600 MG tablet, Take 1 tablet (600 mg total) by mouth every 8 (eight) hours as needed., Disp: 30 tablet, Rfl: 0   loratadine  (CLARITIN ) 10 MG tablet, Take 1 tablet (10 mg total) by mouth daily., Disp: 90 tablet, Rfl: 3   Menthol , Topical Analgesic, (BIOFREEZE COOL THE PAIN LARGE) 5 % PTCH, Use 1 patch every 12 hours as needed, Disp: 14 patch, Rfl: 0   methylPREDNISolone  (MEDROL  DOSEPAK) 4 MG TBPK tablet, 6 day taper; take as directed on package instructions, Disp: 21 tablet, Rfl: 0   Multiple Vitamins-Minerals (WOMENS MULTIVITAMIN PO), Take 1 tablet by mouth daily in the afternoon., Disp: , Rfl:    naproxen  (NAPROSYN )  500 MG tablet, Take 1 tablet (500 mg total) by mouth 2 (two) times daily with a meal., Disp: 30 tablet, Rfl: 0   Norethindrone  Acetate-Ethinyl Estradiol  (LOESTRIN) 1.5-30 MG-MCG tablet, Take 1 tablet by mouth daily., Disp: 28 tablet, Rfl: 3   ondansetron  (ZOFRAN -ODT) 4 MG disintegrating tablet, Take 1 tablet (4 mg total) by mouth every 8 (eight) hours as needed., Disp: 20 tablet, Rfl: 0   phentermine  15 MG capsule, Take 1 capsule (15 mg total) by mouth every morning., Disp: 30 capsule, Rfl: 0   Prenatal Vit-Fe Fumarate-FA (M-NATAL PLUS) 27-1 MG TABS, Take 1 tablet by mouth daily., Disp: , Rfl:   Observations/Objective: Patient is well-developed, well-nourished in no acute distress.  Resting comfortably  at home.  Head is  normocephalic, atraumatic.  No labored breathing.  Speech is clear and coherent with logical content.  Patient is alert and oriented at baseline.    Assessment and Plan: 1. Viral URI with cough (Primary)  Increase fluids, discussed viral illnesses and antibiotic resistance, hold antibiotic for 2-3 days and start if sx persist or worsen. Continue albuterol .   Follow Up Instructions: I discussed the assessment and treatment plan with the patient. The patient was provided an opportunity to ask questions and all were answered. The patient agreed with the plan and demonstrated an understanding of the instructions.  A copy of instructions were sent to the patient via MyChart unless otherwise noted below.     The patient was advised to call back or seek an in-person evaluation if the symptoms worsen or if the condition fails to improve as anticipated.    Tianne Plott, FNP

## 2024-08-24 ENCOUNTER — Telehealth: Payer: Self-pay

## 2024-08-24 NOTE — Telephone Encounter (Signed)
 Copied from CRM [id number]. Topic: Clinical - Prescription Issue >> Aug 24, 2024  2:38 PM Selinda RAMAN wrote: Reason for CRM: The patient called in stating she just ran out of her Norethindrone  Acetate-Ethinyl Estradiol  (LOESTRIN) 1.5-30 MG-MCG tablet and this was previously prescribed by her last primary care provider. She would like if at least 1 refill could be called in to last her to her appointment on 09/05/24.  Please assist further as she uses   [facility] - [place], [place] - [address]  Phone: [phone] Fax: [phone]

## 2024-08-28 ENCOUNTER — Other Ambulatory Visit: Payer: Self-pay | Admitting: Family

## 2024-08-28 DIAGNOSIS — Z3041 Encounter for surveillance of contraceptive pills: Secondary | ICD-10-CM

## 2024-08-28 MED ORDER — NORETHINDRONE ACET-ETHINYL EST 1.5-30 MG-MCG PO TABS: 1.0000 | ORAL_TABLET | Freq: Every day | ORAL | 11 refills | Status: AC

## 2024-08-28 NOTE — Telephone Encounter (Signed)
 No one answered so I left patient a voicemail to return my call.

## 2024-08-28 NOTE — Telephone Encounter (Signed)
 Complete

## 2024-08-28 NOTE — Telephone Encounter (Signed)
 I called patient and made her aware that her prescription was sent in (Norethindrone  Acetate-Ethinyl Estradiol  (LOESTRIN) 1.5-30 MG-MCG tablet )

## 2024-08-29 NOTE — Telephone Encounter (Signed)
 I called patient to make her aware that prescription was sent in

## 2024-09-05 ENCOUNTER — Telehealth: Payer: Self-pay | Admitting: Family

## 2024-09-05 ENCOUNTER — Encounter (INDEPENDENT_AMBULATORY_CARE_PROVIDER_SITE_OTHER): Payer: MEDICAID | Admitting: Family

## 2024-09-05 DIAGNOSIS — Z3041 Encounter for surveillance of contraceptive pills: Secondary | ICD-10-CM

## 2024-09-05 NOTE — Telephone Encounter (Signed)
 Called pt and left vm to call office back to reschedule missed appt

## 2024-09-05 NOTE — Progress Notes (Signed)
 Erroneous encounter-disregard

## 2024-09-12 ENCOUNTER — Telehealth: Payer: MEDICAID | Admitting: Physician Assistant

## 2024-09-12 DIAGNOSIS — M545 Low back pain, unspecified: Secondary | ICD-10-CM

## 2024-09-12 DIAGNOSIS — J069 Acute upper respiratory infection, unspecified: Secondary | ICD-10-CM

## 2024-09-12 MED ORDER — PROMETHAZINE-DM 6.25-15 MG/5ML PO SYRP: 5.0000 mL | ORAL_SOLUTION | Freq: Four times a day (QID) | ORAL | 0 refills | Status: AC | PRN

## 2024-09-12 MED ORDER — PREDNISONE 10 MG PO TABS: ORAL_TABLET | ORAL | 0 refills | Status: AC

## 2024-09-12 MED ORDER — BACLOFEN 20 MG PO TABS: 20.0000 mg | ORAL_TABLET | Freq: Three times a day (TID) | ORAL | 0 refills | Status: AC

## 2024-09-12 NOTE — Progress Notes (Signed)
 Virtual Visit Consent   Dawn Davies, you are scheduled for a virtual visit with a [facility] provider today. Just as with appointments in the office, your consent must be obtained to participate. Your consent will be active for this visit and any virtual visit you may have with one of our providers in the next 365 days. If you have a MyChart account, a copy of this consent can be sent to you electronically.  As this is a virtual visit, video technology does not allow for your provider to perform a traditional examination. This may limit your provider's ability to fully assess your condition. If your provider identifies any concerns that need to be evaluated in person or the need to arrange testing (such as labs, EKG, etc.), we will make arrangements to do so. Although advances in technology are sophisticated, we cannot ensure that it will always work on either your end or our end. If the connection with a video visit is poor, the visit may have to be switched to a telephone visit. With either a video or telephone visit, we are not always able to ensure that we have a secure connection.  By engaging in this virtual visit, you consent to the provision of healthcare and authorize for your insurance to be billed (if applicable) for the services provided during this visit. Depending on your insurance coverage, you may receive a charge related to this service.  I need to obtain your verbal consent now. Are you willing to proceed with your visit today? Dawn Davies has provided verbal consent on 09/12/2024 for a virtual visit (video or telephone). Dawn Davies, [place]  Date: 09/12/2024 4:13 PM   Virtual Visit via Video Note   I, Dawn Davies, connected with  Dawn Davies  ([id number], 1991/11/14) on 09/12/24 at  4:00 PM EDT by a video-enabled telemedicine application and verified that I am speaking with the correct person using two identifiers.  Location: Patient: Virtual Visit Location  Patient: Home Provider: Virtual Visit Location Provider: Home Office   I discussed the limitations of evaluation and management by telemedicine and the availability of in person appointments. The patient expressed understanding and agreed to proceed.    History of Present Illness: Dawn Davies is a 33 y.o. who identifies as a female who was assigned female at birth, and is being seen today for flare of back pain, initially left lower but now bilateral. Notes starting after having to restrain her child who has a lot of sensory issues, so that he would not hurt himself. Denies trauma. Denies skin changes or swelling. OTC Ibuprofen  with little relief.   Patient also mentioned residual dry cough after recent URI. Is requesting a one-time refill of cough medication if possible.   HPI: HPI  Problems:  Patient Active Problem List   Diagnosis Date Noted   Pre-eclampsia 03/08/2024   Obesity (BMI 30-39.9) 03/08/2024   Encounter for counseling regarding contraception 03/08/2024   History of gestational diabetes 08/13/2022   Encounter for monitoring Subutex  maintenance therapy 08/03/2022   GBS (group B Streptococcus carrier), +RV culture, currently pregnant 04/09/2022   History of hepatitis C 04/08/2022   Vacuum-assisted vaginal delivery 04/07/2022   Thyroid  condition 04/07/2022   GDM, class A2 04/07/2022   Echogenic focus of bowel of fetus affecting management of mother in singleton pregnancy, antepartum 02/06/2022   Asthmatic bronchitis, mild intermittent, with acute exacerbation 01/21/2022   Nicotine  dependence, cigarettes, uncomplicated 01/21/2022   Echogenic bowel of fetus on prenatal ultrasound 11/19/2021  Late prenatal care 10/22/2021   Seasonal allergic rhinitis 05/07/2021   Abdominal mass 03/19/2021   Abnormal liver function tests 03/19/2021   Acute pelvic pain 03/19/2021   Menorrhagia with irregular cycle 01/14/2021   Multiple thyroid  nodules 01/14/2021   Postpartum major  depression in remission 04/03/2020   Thyrotoxicosis 02/06/2020   Pregnancy complicated by subutex  maintenance, antepartum (HCC) 02/06/2020   Pap smear of cervix with ASCUS, cannot exclude HGSIL 09/01/2019   Opioid use disorder, moderate, in sustained remission, on maintenance therapy (HCC) 08/24/2019   Rh negative, antepartum 08/20/2019   History of pre-eclampsia 08/18/2019   Ovarian cyst 07/24/2019   History of completed stroke 07/12/2019   Smoker 07/12/2019   Moderate episode of recurrent major depressive disorder (HCC) 03/08/2019   Severe benzodiazepine use disorder (HCC) 09/21/2018   Seizure disorder (HCC) 06/22/2017   Generalized anxiety disorder with panic attacks 07/23/2016   Memory loss 03/28/2013   Cerebral vasospasm 03/10/2012   Anemia 03/10/2012   Subarachnoid hemorrhage (HCC) 03/03/2012    Allergies:  Allergies  Allergen Reactions   Morphine Anaphylaxis and Rash   Hydrocodone-Acetaminophen  Itching and Other (See Comments)    Reaction not specified by patient Reaction not specified by pat    Naloxone Nausea And Vomiting   Varenicline Other (See Comments)    headaches   Xyzal [Levocetirizine] Anxiety    Panic attack   Medications:  Current Outpatient Medications:    baclofen (LIORESAL) 20 MG tablet, Take 1 tablet (20 mg total) by mouth 3 (three) times daily., Disp: 30 each, Rfl: 0   predniSONE  (DELTASONE ) 10 MG tablet, Take 5 tablets (50 mg total) by mouth daily with breakfast for 2 days, THEN 4 tablets (40 mg total) daily with breakfast for 2 days, THEN 3 tablets (30 mg total) daily with breakfast for 2 days, THEN 2 tablets (20 mg total) daily with breakfast for 2 days, THEN 1 tablet (10 mg total) daily with breakfast for 2 days., Disp: 30 tablet, Rfl: 0   promethazine -dextromethorphan (PROMETHAZINE -DM) 6.25-15 MG/5ML syrup, Take 5 mLs by mouth 4 (four) times daily as needed for cough., Disp: 118 mL, Rfl: 0   albuterol  (VENTOLIN  HFA) 108 (90 Base) MCG/ACT inhaler,  Inhale 1-2 puffs into the lungs every 6 (six) hours as needed for wheezing or shortness of breath (cough)., Disp: 8 g, Rfl: 0   fluticasone  (FLONASE ) 50 MCG/ACT nasal spray, Place 2 sprays into both nostrils daily., Disp: 16 g, Rfl: 0   loratadine  (CLARITIN ) 10 MG tablet, Take 1 tablet (10 mg total) by mouth daily., Disp: 90 tablet, Rfl: 3   Norethindrone  Acetate-Ethinyl Estradiol  (LOESTRIN) 1.5-30 MG-MCG tablet, Take 1 tablet by mouth daily., Disp: 28 tablet, Rfl: 11   phentermine  15 MG capsule, Take 1 capsule (15 mg total) by mouth every morning., Disp: 30 capsule, Rfl: 0  Observations/Objective: Patient is well-developed, well-nourished in no acute distress.  Resting comfortably at home.  Head is normocephalic, atraumatic.  No labored breathing. Speech is clear and coherent with logical content.  Patient is alert and oriented at baseline.   Assessment and Plan: 1. Viral URI with cough - promethazine -dextromethorphan (PROMETHAZINE -DM) 6.25-15 MG/5ML syrup; Take 5 mLs by mouth 4 (four) times daily as needed for cough.  Dispense: 118 mL; Refill: 0  Resolved. Some residual airway inflammation causing remnant mild cough, exacerbating her back spasms. Will give one time fill of promethazine -dm syrup  2. Acute bilateral low back pain without sciatica (Primary) - predniSONE  (DELTASONE ) 10 MG tablet; Take 5 tablets (50  mg total) by mouth daily with breakfast for 2 days, THEN 4 tablets (40 mg total) daily with breakfast for 2 days, THEN 3 tablets (30 mg total) daily with breakfast for 2 days, THEN 2 tablets (20 mg total) daily with breakfast for 2 days, THEN 1 tablet (10 mg total) daily with breakfast for 2 days.  Dispense: 30 tablet; Refill: 0 - baclofen (LIORESAL) 20 MG tablet; Take 1 tablet (20 mg total) by mouth 3 (three) times daily.  Dispense: 30 each; Refill: 0  Supportive measures and OTC Medications reviewed. Prednisone  taper and Baclofen per orders. PCP follow-up discussed.  Follow Up  Instructions: I discussed the assessment and treatment plan with the patient. The patient was provided an opportunity to ask questions and all were answered. The patient agreed with the plan and demonstrated an understanding of the instructions.  A copy of instructions were sent to the patient via MyChart unless otherwise noted below.   The patient was advised to call back or seek an in-person evaluation if the symptoms worsen or if the condition fails to improve as anticipated.    Dawn Velma Lunger, PA-C

## 2024-09-12 NOTE — Patient Instructions (Addendum)
 Dawn Davies, thank you for joining Dawn Dawn Lunger, PA-C for today's virtual visit.  While this provider is not your primary care provider (PCP), if your PCP is located in our provider database this encounter information will be shared with them immediately following your visit.   A Dawn Davies MyChart account gives you access to today's visit and all your visits, tests, and labs performed at [facility] [place]  click here if you don't have a Dawn Davies MyChart account or go to [email or web address]  Consent: (Patient) Dawn Davies provided verbal consent for this virtual visit at the beginning of the encounter.  Current Medications:  Current Outpatient Medications:    albuterol  (VENTOLIN  HFA) 108 (90 Base) MCG/ACT inhaler, Inhale 1-2 puffs into the lungs every 6 (six) hours as needed for wheezing or shortness of breath (cough)., Disp: 8 g, Rfl: 0   brompheniramine-pseudoephedrine-DM 30-2-10 MG/5ML syrup, Take 5 mLs by mouth 4 (four) times daily as needed., Disp: 120 mL, Rfl: 0   buprenorphine  (SUBUTEX ) 8 MG SUBL SL tablet, Place 24 mg under the tongue daily as needed for withdrawal. (Patient taking differently: Place 8 mg under the tongue in the morning, at noon, and at bedtime.), Disp: , Rfl:    cyclobenzaprine  (FLEXERIL ) 10 MG tablet, Take 0.5-1 tablets (5-10 mg total) by mouth 3 (three) times daily as needed. (Patient not taking: Reported on 05/19/2024), Disp: 30 tablet, Rfl: 0   cyclobenzaprine  (FLEXERIL ) 5 MG tablet, Take 1 tablet (5 mg total) by mouth 3 (three) times daily as needed for muscle spasms., Disp: 21 tablet, Rfl: 0   ferrous sulfate  325 (65 FE) MG EC tablet, Take by mouth. (Patient not taking: Reported on 05/19/2024), Disp: , Rfl:    fluticasone  (FLONASE ) 50 MCG/ACT nasal spray, Place 2 sprays into both nostrils daily., Disp: 16 g, Rfl: 0   gabapentin  (NEURONTIN ) 600 MG tablet, Take 600 mg by mouth 3 (three) times daily.  (Patient taking differently: Take 400 mg  by mouth 3 (three) times daily.), Disp: , Rfl:    hydrOXYzine  (VISTARIL ) 25 MG capsule, Take 1 capsule (25 mg total) by mouth every 8 (eight) hours as needed., Disp: 30 capsule, Rfl: 0   ibuprofen  (ADVIL ) 600 MG tablet, Take 1 tablet (600 mg total) by mouth every 8 (eight) hours as needed., Disp: 30 tablet, Rfl: 0   loratadine  (CLARITIN ) 10 MG tablet, Take 1 tablet (10 mg total) by mouth daily., Disp: 90 tablet, Rfl: 3   Menthol , Topical Analgesic, (BIOFREEZE COOL THE PAIN LARGE) 5 % PTCH, Use 1 patch every 12 hours as needed, Disp: 14 patch, Rfl: 0   methylPREDNISolone  (MEDROL  DOSEPAK) 4 MG TBPK tablet, 6 day taper; take as directed on package instructions, Disp: 21 tablet, Rfl: 0   Multiple Vitamins-Minerals (WOMENS MULTIVITAMIN PO), Take 1 tablet by mouth daily in the afternoon., Disp: , Rfl:    naproxen  (NAPROSYN ) 500 MG tablet, Take 1 tablet (500 mg total) by mouth 2 (two) times daily with a meal., Disp: 30 tablet, Rfl: 0   Norethindrone  Acetate-Ethinyl Estradiol  (LOESTRIN) 1.5-30 MG-MCG tablet, Take 1 tablet by mouth daily., Disp: 28 tablet, Rfl: 11   ondansetron  (ZOFRAN -ODT) 4 MG disintegrating tablet, Take 1 tablet (4 mg total) by mouth every 8 (eight) hours as needed., Disp: 20 tablet, Rfl: 0   phentermine  15 MG capsule, Take 1 capsule (15 mg total) by mouth every morning., Disp: 30 capsule, Rfl: 0   Prenatal Vit-Fe Fumarate-FA (M-NATAL PLUS) 27-1 MG TABS, Take 1 tablet by mouth daily.,  Disp: , Rfl:    Medications ordered in this encounter:  No orders of the defined types were placed in this encounter.    *If you need refills on other medications prior to your next appointment, please contact your pharmacy*  Follow-Up: Call back or seek an in-person evaluation if the symptoms worsen or if the condition fails to improve as anticipated.  [facility] [phone]  Other Instructions Please increase fluids and plenty of rest. I have sent in a one-time refill of your  cough medication. If not continuing to resolve, you will need to follow-up with your PCP.  Avoid heavy lifting and overexertion. Take the steroid taper and muscle relaxant as directed for low back pain. Do not take both the muscle relaxant and cough syrup at bedtime. Giving recurring back issues, I highly recommend a follow-up with your PCP for ongoing management.  If you note any non-resolving, new, or worsening symptoms despite treatment, please seek an in-person evaluation ASAP.   If you have been instructed to have an in-person evaluation today at a local Urgent Care facility, please use the link below. It will take you to a list of all of our available [facility], including address, phone number and hours of operation. Please do not delay care.  [facility]  If you or a family member do not have a primary care provider, use the link below to schedule a visit and establish care. When you choose a [facility] primary care physician or advanced practice provider, you gain a long-term partner in health. Find a Primary Care Provider  Learn more about [facility] in-office and virtual care options: [facility] Get Care Now

## 2024-10-03 ENCOUNTER — Ambulatory Visit: Payer: MEDICAID | Admitting: Family

## 2024-10-12 ENCOUNTER — Other Ambulatory Visit: Payer: Self-pay | Admitting: Family

## 2024-10-12 ENCOUNTER — Telehealth: Payer: Self-pay | Admitting: Family

## 2024-10-12 DIAGNOSIS — Z6832 Body mass index (BMI) 32.0-32.9, adult: Secondary | ICD-10-CM

## 2024-10-12 DIAGNOSIS — Z7689 Persons encountering health services in other specified circumstances: Secondary | ICD-10-CM

## 2024-10-12 NOTE — Telephone Encounter (Unsigned)
 Copied from CRM #[id number]. Topic: Clinical - Medication Refill >> Oct 12, 2024  3:15 PM Zebedee SAUNDERS wrote: Medication: phentermine  15 MG capsule  Has the patient contacted their pharmacy? Yes (Agent: If no, request that the patient contact the pharmacy for the refill. If patient does not wish to contact the pharmacy document the reason why and proceed with request.) (Agent: If yes, when and what did the pharmacy advise?)  This is the patient's preferred pharmacy:   [id number] [address], [place] - [address] [place] [address] Phone: [phone] Fax: [phone]  Is this the correct pharmacy for this prescription? Yes If no, delete pharmacy and type the correct one.   Has the prescription been filled recently? Yes  Is the patient out of the medication? Yes  Has the patient been seen for an appointment in the last year OR does the patient have an upcoming appointment? Yes  Can we respond through MyChart? Yes  Agent: Please be advised that Rx refills may take up to 3 business days. We ask that you follow-up with your pharmacy.

## 2024-10-12 NOTE — Telephone Encounter (Signed)
 Copied from CRM #[id number]. Topic: Referral - Status >> Oct 12, 2024  3:03 PM Zebedee SAUNDERS wrote: Reason for CRM: Pt needs referral #[id number] redirected. Please call pt at [phone].

## 2024-10-13 NOTE — Telephone Encounter (Signed)
 noted

## 2024-10-13 NOTE — Telephone Encounter (Signed)
 Requested medications are due for refill today.  yes  Requested medications are on the active medications list.  yes  Last refill. 06/16/2024 #30 0 rf  Future visit scheduled.   yes  Notes to clinic.  Refill not delegated    Requested Prescriptions  Pending Prescriptions Disp Refills   phentermine  15 MG capsule 30 capsule 0    Sig: Take 1 capsule (15 mg total) by mouth every morning.     Not Delegated - Neurology: Anticonvulsants - Controlled - phentermine  hydrochloride Failed - 10/13/2024  4:09 PM      Failed - This refill cannot be delegated      Failed - Weight completed in the last 3 months    Wt Readings from Last 1 Encounters:  06/13/24 211 lb 3.2 oz (95.8 kg)         Passed - eGFR in normal range and within 360 days    GFR calc Af Amer  Date Value Ref Range Status  02/28/2020 >60 >60 mL/min Final   GFR, Estimated  Date Value Ref Range Status  04/06/2022 >60 >60 mL/min Final    Comment:    (NOTE) Calculated using the CKD-EPI Creatinine Equation (2021)    eGFR  Date Value Ref Range Status  06/13/2024 113 >59 mL/min/1.73 Final         Passed - Cr in normal range and within 360 days    Creatinine, Ser  Date Value Ref Range Status  06/13/2024 0.72 0.57 - 1.00 mg/dL Final         Passed - Last BP in normal range    BP Readings from Last 1 Encounters:  06/13/24 132/88         Passed - Valid encounter within last 6 months    Recent Outpatient Visits           4 months ago Annual physical exam   [facility], Amy J, NP   5 months ago Encounter to establish care   [facility], Greig PARAS, NP

## 2024-10-13 NOTE — Telephone Encounter (Signed)
 I returned patient call no one answered so I left a voicemail to return my call.

## 2024-10-16 ENCOUNTER — Other Ambulatory Visit: Payer: Self-pay | Admitting: Family

## 2024-10-16 ENCOUNTER — Telehealth: Payer: Self-pay | Admitting: Family

## 2024-10-16 DIAGNOSIS — R519 Headache, unspecified: Secondary | ICD-10-CM

## 2024-10-16 NOTE — Telephone Encounter (Signed)
 Pt scheduled appt for weight management on 12/09 next available with Dr. Tanda (transferring care from Amy to Dr. Tanda). Pt wants to know if her phentermine  can be called in to hold her until next appt. Please advise.

## 2024-10-16 NOTE — Telephone Encounter (Signed)
 Complete

## 2024-10-18 NOTE — Telephone Encounter (Signed)
 Pt DB for sooner appt

## 2024-10-25 ENCOUNTER — Encounter: Payer: MEDICAID | Admitting: Family Medicine

## 2024-11-14 ENCOUNTER — Encounter: Payer: MEDICAID | Admitting: Family Medicine

## 2024-12-01 ENCOUNTER — Encounter: Payer: MEDICAID | Admitting: Family Medicine

## 2025-01-05 ENCOUNTER — Encounter: Payer: Self-pay | Admitting: Family

## 2025-01-09 ENCOUNTER — Other Ambulatory Visit (HOSPITAL_COMMUNITY): Payer: Self-pay

## 2025-01-09 ENCOUNTER — Telehealth: Payer: Self-pay

## 2025-01-09 ENCOUNTER — Other Ambulatory Visit: Payer: Self-pay | Admitting: Family

## 2025-01-09 DIAGNOSIS — B1920 Unspecified viral hepatitis C without hepatic coma: Secondary | ICD-10-CM

## 2025-01-09 NOTE — Telephone Encounter (Signed)
 Complete

## 2025-01-09 NOTE — Telephone Encounter (Signed)
 Pharmacy Patient Advocate Encounter  Insurance verification completed.   The patient is insured through [employer] [place] [employer]   Ran test claim for Mavyret. Currently a quantity of 84 is a 28 day supply and the co-pay is $4.00 . Epclusa $4.00  This test claim was processed through [facility]- copay amounts may vary at other pharmacies due to pharmacy/plan contracts, or as the patient moves through the different stages of their insurance plan.

## 2025-01-12 ENCOUNTER — Encounter: Payer: Self-pay | Admitting: Neurology

## 2025-01-16 ENCOUNTER — Encounter: Payer: Self-pay | Admitting: Infectious Diseases

## 2025-04-23 ENCOUNTER — Ambulatory Visit: Payer: Self-pay | Admitting: Neurology

## 2025-06-13 ENCOUNTER — Encounter: Payer: MEDICAID | Admitting: Family
